# Patient Record
Sex: Male | Born: 1960
Health system: Southern US, Community
[De-identification: ages and names within clinical notes are randomized; demographics above are authoritative.]

## PROBLEM LIST (undated history)

## (undated) DIAGNOSIS — K219 Gastro-esophageal reflux disease without esophagitis: Secondary | ICD-10-CM

## (undated) DIAGNOSIS — N529 Male erectile dysfunction, unspecified: Secondary | ICD-10-CM

## (undated) DIAGNOSIS — U071 COVID-19: Secondary | ICD-10-CM

## (undated) DIAGNOSIS — G4733 Obstructive sleep apnea (adult) (pediatric): Secondary | ICD-10-CM

## (undated) DIAGNOSIS — F329 Major depressive disorder, single episode, unspecified: Secondary | ICD-10-CM

## (undated) DIAGNOSIS — F32A Depression, unspecified: Secondary | ICD-10-CM

## (undated) DIAGNOSIS — C801 Malignant (primary) neoplasm, unspecified: Secondary | ICD-10-CM

## (undated) DIAGNOSIS — T69029A Immersion foot, unspecified foot, initial encounter: Secondary | ICD-10-CM

## (undated) DIAGNOSIS — C61 Malignant neoplasm of prostate: Secondary | ICD-10-CM

## (undated) DIAGNOSIS — Z9989 Dependence on other enabling machines and devices: Secondary | ICD-10-CM

## (undated) DIAGNOSIS — Z87442 Personal history of urinary calculi: Secondary | ICD-10-CM

## (undated) DIAGNOSIS — E119 Type 2 diabetes mellitus without complications: Secondary | ICD-10-CM

## (undated) DIAGNOSIS — Z5189 Encounter for other specified aftercare: Secondary | ICD-10-CM

## (undated) DIAGNOSIS — T7840XA Allergy, unspecified, initial encounter: Secondary | ICD-10-CM

## (undated) DIAGNOSIS — I10 Essential (primary) hypertension: Secondary | ICD-10-CM

## (undated) DIAGNOSIS — R972 Elevated prostate specific antigen [PSA]: Secondary | ICD-10-CM

## (undated) DIAGNOSIS — E785 Hyperlipidemia, unspecified: Secondary | ICD-10-CM

## (undated) DIAGNOSIS — M1990 Unspecified osteoarthritis, unspecified site: Secondary | ICD-10-CM

## (undated) DIAGNOSIS — L709 Acne, unspecified: Secondary | ICD-10-CM

## (undated) DIAGNOSIS — E559 Vitamin D deficiency, unspecified: Secondary | ICD-10-CM

## (undated) DIAGNOSIS — G4730 Sleep apnea, unspecified: Secondary | ICD-10-CM

## (undated) DIAGNOSIS — N189 Chronic kidney disease, unspecified: Secondary | ICD-10-CM

## (undated) HISTORY — DX: Essential (primary) hypertension: I10

## (undated) HISTORY — PX: OTHER SURGICAL HISTORY: SHX169

## (undated) HISTORY — DX: Elevated prostate specific antigen (PSA): R97.20

## (undated) HISTORY — PX: COLONOSCOPY: SHX174

## (undated) HISTORY — DX: COVID-19: U07.1

## (undated) HISTORY — DX: Depression, unspecified: F32.A

## (undated) HISTORY — PX: HERNIA REPAIR: SHX51

## (undated) HISTORY — PX: INGUINAL HERNIA REPAIR: SHX194

## (undated) HISTORY — DX: Chronic kidney disease, unspecified: N18.9

## (undated) HISTORY — DX: Hyperlipidemia, unspecified: E78.5

## (undated) HISTORY — DX: Vitamin D deficiency, unspecified: E55.9

## (undated) HISTORY — DX: Sleep apnea, unspecified: G47.30

## (undated) HISTORY — PX: POLYPECTOMY: SHX149

## (undated) HISTORY — DX: Obstructive sleep apnea (adult) (pediatric): G47.33

## (undated) HISTORY — DX: Dependence on other enabling machines and devices: Z99.89

## (undated) HISTORY — DX: Allergy, unspecified, initial encounter: T78.40XA

## (undated) HISTORY — DX: Acne, unspecified: L70.9

## (undated) HISTORY — DX: Encounter for other specified aftercare: Z51.89

## (undated) HISTORY — DX: Major depressive disorder, single episode, unspecified: F32.9

## (undated) HISTORY — DX: Type 2 diabetes mellitus without complications: E11.9

## (undated) HISTORY — DX: Immersion foot, unspecified foot, initial encounter: T69.029A

## (undated) HISTORY — DX: Unspecified osteoarthritis, unspecified site: M19.90

---

## 1983-03-29 HISTORY — PX: OTHER SURGICAL HISTORY: SHX169

## 2004-11-30 ENCOUNTER — Ambulatory Visit: Payer: Self-pay | Admitting: Internal Medicine

## 2005-01-10 ENCOUNTER — Ambulatory Visit: Payer: Self-pay | Admitting: Internal Medicine

## 2005-03-11 ENCOUNTER — Ambulatory Visit: Payer: Self-pay | Admitting: Internal Medicine

## 2005-12-02 ENCOUNTER — Ambulatory Visit: Payer: Self-pay | Admitting: Internal Medicine

## 2005-12-09 ENCOUNTER — Ambulatory Visit: Payer: Self-pay | Admitting: Internal Medicine

## 2006-08-14 ENCOUNTER — Ambulatory Visit: Payer: Self-pay | Admitting: Internal Medicine

## 2006-08-14 LAB — CONVERTED CEMR LAB
CO2: 26 meq/L (ref 19–32)
Cholesterol: 213 mg/dL (ref 0–200)
Creatinine, Ser: 1.1 mg/dL (ref 0.4–1.5)
Direct LDL: 116.8 mg/dL
Glucose, Bld: 98 mg/dL (ref 70–99)
HDL: 36.7 mg/dL — ABNORMAL LOW (ref >39.0)
Potassium: 3.7 meq/L (ref 3.5–5.1)
Sodium: 143 meq/L (ref 135–145)
VLDL: 34 mg/dL (ref 0–40)

## 2007-04-02 ENCOUNTER — Telehealth: Payer: Self-pay | Admitting: Internal Medicine

## 2007-04-02 ENCOUNTER — Ambulatory Visit: Payer: Self-pay | Admitting: Internal Medicine

## 2007-04-04 LAB — CONVERTED CEMR LAB
ALT: 48 units/L (ref 0–53)
AST: 34 units/L (ref 0–37)
Alkaline Phosphatase: 77 units/L (ref 39–117)
BUN: 14 mg/dL (ref 6–23)
Basophils Relative: 0 % (ref 0.0–1.0)
Bilirubin, Direct: 0.2 mg/dL (ref 0.0–0.3)
CO2: 30 meq/L (ref 19–32)
Calcium: 10.1 mg/dL (ref 8.4–10.5)
Chloride: 103 meq/L (ref 96–112)
Eosinophils Relative: 0.8 % (ref 0.0–5.0)
GFR calc non Af Amer: 77 mL/min
Glucose, Bld: 85 mg/dL (ref 70–99)
Ketones, ur: NEGATIVE mg/dL
Monocytes Relative: 6.3 % (ref 3.0–11.0)
Nitrite: NEGATIVE
Platelets: 268 10*3/uL (ref 150–400)
RBC: 6.01 M/uL — ABNORMAL HIGH (ref 4.22–5.81)
Specific Gravity, Urine: >=1.03 (ref 1.000–1.03)
TSH: 3.33 microintl units/mL (ref 0.35–5.50)
Total CHOL/HDL Ratio: 7
Total Protein: 7.6 g/dL (ref 6.0–8.3)
Triglycerides: 196 mg/dL — ABNORMAL HIGH (ref 0–149)
Urine Glucose: NEGATIVE mg/dL
VLDL: 39 mg/dL (ref 0–40)
Vit D, 1,25-Dihydroxy: 17 — ABNORMAL LOW (ref 30–89)
WBC: 8.4 10*3/uL (ref 4.5–10.5)
pH: 5.5 (ref 5.0–8.0)

## 2007-04-05 ENCOUNTER — Encounter: Payer: Self-pay | Admitting: Internal Medicine

## 2007-04-05 DIAGNOSIS — I10 Essential (primary) hypertension: Secondary | ICD-10-CM

## 2007-04-06 ENCOUNTER — Ambulatory Visit: Payer: Self-pay | Admitting: Internal Medicine

## 2007-04-06 ENCOUNTER — Ambulatory Visit: Payer: Self-pay | Admitting: Gastroenterology

## 2007-04-06 DIAGNOSIS — F329 Major depressive disorder, single episode, unspecified: Secondary | ICD-10-CM

## 2007-04-06 DIAGNOSIS — E559 Vitamin D deficiency, unspecified: Secondary | ICD-10-CM

## 2007-04-06 DIAGNOSIS — E785 Hyperlipidemia, unspecified: Secondary | ICD-10-CM | POA: Insufficient documentation

## 2007-04-06 DIAGNOSIS — K4020 Bilateral inguinal hernia, without obstruction or gangrene, not specified as recurrent: Secondary | ICD-10-CM | POA: Insufficient documentation

## 2007-05-21 ENCOUNTER — Encounter: Payer: Self-pay | Admitting: Internal Medicine

## 2007-05-21 ENCOUNTER — Encounter: Payer: Self-pay | Admitting: Gastroenterology

## 2007-05-21 ENCOUNTER — Ambulatory Visit: Payer: Self-pay | Admitting: Gastroenterology

## 2007-07-02 ENCOUNTER — Ambulatory Visit: Payer: Self-pay | Admitting: Internal Medicine

## 2007-07-02 LAB — CONVERTED CEMR LAB
BUN: 16 mg/dL (ref 6–23)
Calcium: 9.2 mg/dL (ref 8.4–10.5)
GFR calc Af Amer: 85 mL/min
GFR calc non Af Amer: 70 mL/min
Glucose, Bld: 111 mg/dL — ABNORMAL HIGH (ref 70–99)
HDL: 40 mg/dL (ref >39.0)
Potassium: 3.6 meq/L (ref 3.5–5.1)
Triglycerides: 204 mg/dL (ref 0–149)
VLDL: 41 mg/dL — ABNORMAL HIGH (ref 0–40)

## 2007-07-09 ENCOUNTER — Ambulatory Visit: Payer: Self-pay | Admitting: Internal Medicine

## 2008-04-04 ENCOUNTER — Ambulatory Visit: Payer: Self-pay | Admitting: Internal Medicine

## 2008-04-04 LAB — CONVERTED CEMR LAB
ALT: 40 units/L (ref 0–53)
AST: 26 units/L (ref 0–37)
Albumin: 3.9 g/dL (ref 3.5–5.2)
Alkaline Phosphatase: 77 units/L (ref 39–117)
BUN: 16 mg/dL (ref 6–23)
Basophils Relative: 1.2 % (ref 0.0–3.0)
CO2: 31 meq/L (ref 19–32)
Chloride: 107 meq/L (ref 96–112)
Cholesterol: 159 mg/dL (ref 0–200)
Eosinophils Absolute: 0.1 10*3/uL (ref 0.0–0.7)
Eosinophils Relative: 1.6 % (ref 0.0–5.0)
GFR calc non Af Amer: 77 mL/min
Hemoglobin, Urine: NEGATIVE
LDL Cholesterol: 99 mg/dL (ref 0–99)
Leukocytes, UA: NEGATIVE
Lymphocytes Relative: 38.1 % (ref 12.0–46.0)
MCV: 74.5 fL — ABNORMAL LOW (ref 78.0–100.0)
Neutrophils Relative %: 53.2 % (ref 43.0–77.0)
Nitrite: NEGATIVE
Platelets: 224 10*3/uL (ref 150–400)
Potassium: 4 meq/L (ref 3.5–5.1)
RBC: 5.7 M/uL (ref 4.22–5.81)
Specific Gravity, Urine: 1.015 (ref 1.000–1.03)
Total Bilirubin: 0.7 mg/dL (ref 0.3–1.2)
Total CHOL/HDL Ratio: 3.9
Urine Glucose: NEGATIVE mg/dL
Urobilinogen, UA: 0.2 (ref 0.0–1.0)
VLDL: 19 mg/dL (ref 0–40)
WBC: 5 10*3/uL (ref 4.5–10.5)

## 2008-04-11 ENCOUNTER — Ambulatory Visit: Payer: Self-pay | Admitting: Internal Medicine

## 2008-04-21 ENCOUNTER — Telehealth: Payer: Self-pay | Admitting: Internal Medicine

## 2009-01-15 ENCOUNTER — Telehealth: Payer: Self-pay | Admitting: Internal Medicine

## 2009-04-17 ENCOUNTER — Ambulatory Visit: Payer: Self-pay | Admitting: Internal Medicine

## 2009-04-17 LAB — CONVERTED CEMR LAB
ALT: 39 units/L (ref 0–53)
AST: 29 units/L (ref 0–37)
Alkaline Phosphatase: 89 units/L (ref 39–117)
BUN: 13 mg/dL (ref 6–23)
Basophils Relative: 0.6 % (ref 0.0–3.0)
Bilirubin Urine: NEGATIVE
Bilirubin, Direct: 0 mg/dL (ref 0.0–0.3)
Chloride: 103 meq/L (ref 96–112)
Creatinine, Ser: 1.1 mg/dL (ref 0.4–1.5)
Eosinophils Relative: 1.1 % (ref 0.0–5.0)
Glucose, Bld: 91 mg/dL (ref 70–99)
LDL Cholesterol: 82 mg/dL (ref 0–99)
Lymphocytes Relative: 33.3 % (ref 12.0–46.0)
Monocytes Relative: 7.6 % (ref 3.0–12.0)
Neutrophils Relative %: 57.4 % (ref 43.0–77.0)
Nitrite: NEGATIVE
Potassium: 3.9 meq/L (ref 3.5–5.1)
RBC: 5.89 M/uL — ABNORMAL HIGH (ref 4.22–5.81)
Total Protein, Urine: NEGATIVE mg/dL
Total Protein: 8 g/dL (ref 6.0–8.3)
Urine Glucose: NEGATIVE mg/dL
VLDL: 35.2 mg/dL (ref 0.0–40.0)
WBC: 6.6 10*3/uL (ref 4.5–10.5)
pH: 7.5 (ref 5.0–8.0)

## 2009-04-22 ENCOUNTER — Ambulatory Visit: Payer: Self-pay | Admitting: Internal Medicine

## 2009-09-29 ENCOUNTER — Telehealth: Payer: Self-pay | Admitting: Internal Medicine

## 2009-09-29 DIAGNOSIS — R5382 Chronic fatigue, unspecified: Secondary | ICD-10-CM | POA: Insufficient documentation

## 2009-10-04 ENCOUNTER — Encounter: Payer: Self-pay | Admitting: Internal Medicine

## 2009-10-12 ENCOUNTER — Telehealth: Payer: Self-pay | Admitting: Internal Medicine

## 2009-10-22 ENCOUNTER — Telehealth: Payer: Self-pay | Admitting: Internal Medicine

## 2009-10-30 ENCOUNTER — Telehealth: Payer: Self-pay | Admitting: Internal Medicine

## 2009-10-30 DIAGNOSIS — G4733 Obstructive sleep apnea (adult) (pediatric): Secondary | ICD-10-CM | POA: Insufficient documentation

## 2010-04-20 ENCOUNTER — Telehealth: Payer: Self-pay | Admitting: Internal Medicine

## 2010-04-27 ENCOUNTER — Ambulatory Visit: Admit: 2010-04-27 | Payer: Self-pay | Admitting: Internal Medicine
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
## 2010-04-27 NOTE — Progress Notes (Signed)
Summary: RX   Phone Note Call from Patient Call back at [phone] - Erie Noe   Caller: Wife ext [phone] Summary of Call: Patient needs rx for CPAP. OK?  Initial call taken by: Lamar Sprinkles, CMA,  October 30, 2009 11:51 AM  Follow-up for Phone Call        ok thx Follow-up by: Tresa Garter MD,  October 30, 2009 12:56 PM  Additional Follow-up for Phone Call Additional follow up Details #1::        Order pending signature Additional Follow-up by: Lamar Sprinkles, CMA,  October 30, 2009 3:31 PM  New Problems: OBSTRUCTIVE SLEEP APNEA (ICD-327.23)   Additional Follow-up for Phone Call Additional follow up Details #2::    Pt's wife informed, rx up front Follow-up by: Lamar Sprinkles, CMA,  October 30, 2009 6:09 PM  New Problems: OBSTRUCTIVE SLEEP APNEA (ICD-327.23)

## 2010-04-27 NOTE — Progress Notes (Signed)
Summary: BP MED  Phone Note Call from Patient   Summary of Call: Pt was recently changed to higher dose of triam/hctz due to pharm not having avail dose. Pt found local pharmacy that has in stock he would like lower dose sent in.  Initial call taken by: Lamar Sprinkles, CMA,  October 22, 2009 9:21 AM  Follow-up for Phone Call        ok  Follow-up by: Tresa Garter MD,  October 22, 2009 12:10 PM  Additional Follow-up for Phone Call Additional follow up Details #1::        informed pt rx sent in Additional Follow-up by: Brenton Grills MA,  October 22, 2009 1:36 PM    New/Updated Medications: TRIAMTERENE-HCTZ 37.5-25 MG TABS (TRIAMTERENE-HCTZ) 1 once daily Prescriptions: TRIAMTERENE-HCTZ 37.5-25 MG TABS (TRIAMTERENE-HCTZ) 1 once daily  #90 x 1   Entered by:   Lamar Sprinkles, CMA   Authorized by:   Tresa Garter MD   Signed by:   Lamar Sprinkles, CMA on 10/22/2009   Method used:   Electronically to        [facility]* (retail)       [address]       [place], [place]  [id number]       Ph: [phone]       Fax: [phone]   RxID:   [id number]

## 2010-04-27 NOTE — Progress Notes (Signed)
Summary: Med question-Traim/HCTZ  Phone Note Refill Request Message from:  Fax from Pharmacy  pharm states they can't get Triam/HCTZ 37.5/25.  They do have 75/50mg .  Can we change?    Method Requested: Electronic Initial call taken by: Lanier Prude, [facility]),  October 12, 2009 4:15 PM  Follow-up for Phone Call        ok to change  thx Follow-up by: Tresa Garter MD,  October 12, 2009 6:06 PM    New/Updated Medications: TRIAMTERENE-HCTZ 75-50 MG TABS (TRIAMTERENE-HCTZ) 1/2 tab once daily Prescriptions: TRIAMTERENE-HCTZ 75-50 MG TABS (TRIAMTERENE-HCTZ) 1/2 tab once daily  #45 x 1   Entered by:   Lamar Sprinkles, CMA   Authorized by:   Tresa Garter MD   Signed by:   Lamar Sprinkles, CMA on 10/12/2009   Method used:   Electronically to        [facility]. [facility] * (retail)       [address]       [place], [place]  [address]       Ph: [phone]       Fax: [phone]   RxID:   [id number]
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
## 2010-04-29 NOTE — Progress Notes (Signed)
  Phone Note Call from Patient Call back at Home Phone [phone]   Caller: [phone] ext [phone] [phone] Call For: Tresa Garter MD Summary of Call: Pt had to resched cpx, pt needs 2 mos refills of meds, cpx in feb. Please advise Initial call taken by: Verdell Face,  April 20, 2010 3:58 PM    Prescriptions: LOVASTATIN 40 MG TABS (LOVASTATIN) Take 1 tab by mouth daily  #90 x 3   Entered by:   Lanier Prude, CMA(AAMA)   Authorized by:   Tresa Garter MD   Signed by:   Lanier Prude, [facility]) on 04/21/2010   Method used:   Electronically to        [facility]* (retail)       [address]       [address] Shipping/mailing       [place], [place]  [address]       Ph: [phone]       Fax: [phone]   RxID:   [id number] TRIAMTERENE-HCTZ 37.5-25 MG TABS (TRIAMTERENE-HCTZ) 1 once daily  #90 x 3   Entered by:   Lanier Prude, CMA(AAMA)   Authorized by:   Tresa Garter MD   Signed by:   Lanier Prude, [facility]) on 04/21/2010   Method used:   Electronically to        [facility]* (retail)       [address]       [address]       [place], [place]  [address]       Ph: [phone]       Fax: [phone]   RxID:   [id number] LISINOPRIL 40 MG TABS (LISINOPRIL) 1 by mouth qd  #90 x 3   Entered by:   Lanier Prude, CMA(AAMA)   Authorized by:   Tresa Garter MD   Signed by:   Lanier Prude, [facility]) on 04/21/2010   Method used:   Electronically to        [facility]* (retail)       [address]       [address] Shipping[address]mailing       [place], [place]  [address]       Ph: [phone]       Fax: [phone]   RxID:   [id number]

## 2010-05-04 ENCOUNTER — Encounter: Payer: Self-pay | Admitting: Internal Medicine

## 2010-05-19 ENCOUNTER — Other Ambulatory Visit: Payer: Commercial Managed Care - PPO

## 2010-05-19 ENCOUNTER — Encounter (INDEPENDENT_AMBULATORY_CARE_PROVIDER_SITE_OTHER): Payer: Self-pay | Admitting: *Deleted

## 2010-05-19 ENCOUNTER — Other Ambulatory Visit: Payer: Self-pay | Admitting: Internal Medicine

## 2010-05-19 DIAGNOSIS — Z0389 Encounter for observation for other suspected diseases and conditions ruled out: Secondary | ICD-10-CM

## 2010-05-19 DIAGNOSIS — Z0000 Encounter for general adult medical examination without abnormal findings: Secondary | ICD-10-CM

## 2010-05-19 LAB — CBC WITH DIFFERENTIAL/PLATELET
Basophils Absolute: 0 10*3/uL (ref 0.0–0.1)
Eosinophils Absolute: 0.1 10*3/uL (ref 0.0–0.7)
HCT: 43.7 % (ref 39.0–52.0)
Lymphs Abs: 2.6 10*3/uL (ref 0.7–4.0)
MCV: 76.1 fl — ABNORMAL LOW (ref 78.0–100.0)
Monocytes Absolute: 0.5 10*3/uL (ref 0.1–1.0)
Platelets: 276 10*3/uL (ref 150.0–400.0)
RDW: 15.3 % — ABNORMAL HIGH (ref 11.5–14.6)

## 2010-05-19 LAB — BASIC METABOLIC PANEL
BUN: 11 mg/dL (ref 6–23)
Chloride: 107 mEq/L (ref 96–112)
Glucose, Bld: 76 mg/dL (ref 70–99)
Potassium: 4.5 mEq/L (ref 3.5–5.1)

## 2010-05-19 LAB — URINALYSIS
Bilirubin Urine: NEGATIVE
Total Protein, Urine: NEGATIVE
Urine Glucose: NEGATIVE

## 2010-05-19 LAB — LIPID PANEL
Cholesterol: 149 mg/dL (ref 0–200)
HDL: 40.8 mg/dL (ref >39.00)
VLDL: 18.2 mg/dL (ref 0.0–40.0)

## 2010-05-19 LAB — TSH: TSH: 3.58 u[IU]/mL (ref 0.35–5.50)

## 2010-05-19 LAB — HEPATIC FUNCTION PANEL: Total Bilirubin: 0.9 mg/dL (ref 0.3–1.2)

## 2010-05-26 ENCOUNTER — Encounter (INDEPENDENT_AMBULATORY_CARE_PROVIDER_SITE_OTHER): Payer: Commercial Managed Care - PPO | Admitting: Internal Medicine

## 2010-05-26 ENCOUNTER — Encounter: Payer: Self-pay | Admitting: Internal Medicine

## 2010-05-26 DIAGNOSIS — L708 Other acne: Secondary | ICD-10-CM | POA: Insufficient documentation

## 2010-05-26 DIAGNOSIS — Z0000 Encounter for general adult medical examination without abnormal findings: Secondary | ICD-10-CM

## 2010-05-26 DIAGNOSIS — Z23 Encounter for immunization: Secondary | ICD-10-CM
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
## 2010-08-09 ENCOUNTER — Encounter: Payer: Self-pay | Admitting: Internal Medicine
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
## 2010-08-11 ENCOUNTER — Ambulatory Visit (INDEPENDENT_AMBULATORY_CARE_PROVIDER_SITE_OTHER): Payer: 59 | Admitting: Internal Medicine

## 2010-08-11 ENCOUNTER — Encounter: Payer: Self-pay | Admitting: Internal Medicine

## 2010-08-11 DIAGNOSIS — R5381 Other malaise: Secondary | ICD-10-CM

## 2010-08-11 DIAGNOSIS — L708 Other acne: Secondary | ICD-10-CM

## 2010-08-11 DIAGNOSIS — F329 Major depressive disorder, single episode, unspecified: Secondary | ICD-10-CM

## 2010-08-11 DIAGNOSIS — E559 Vitamin D deficiency, unspecified: Secondary | ICD-10-CM

## 2010-08-11 DIAGNOSIS — I10 Essential (primary) hypertension: Secondary | ICD-10-CM

## 2010-08-11 MED ORDER — VITAMIN D 1000 UNITS PO TABS: 1000.0000 [IU] | ORAL_TABLET | Freq: Every day | ORAL | Status: DC

## 2010-08-11 NOTE — Assessment & Plan Note (Signed)
On Rx 

## 2010-08-11 NOTE — Progress Notes (Signed)
  Subjective:    Patient ID: Nicholas Carr, male    DOB: 06/15/1960, 50 y.o.   MRN: [id number]  HPI  The patient presents for a follow-up of  chronic hypertension, chronic dyslipidemia, obesity, and face rash controlled with medicines    Review of Systems  Constitutional: Negative for chills.  HENT: Negative for nosebleeds, facial swelling and neck pain.   Respiratory: Negative for stridor.   Cardiovascular: Negative for chest pain.  Gastrointestinal: Negative for vomiting.  Genitourinary: Negative for penile pain.  Musculoskeletal: Negative for back pain.  Skin: Positive for rash (acne/scars).  Neurological: Negative for seizures.  Psychiatric/Behavioral: Negative for suicidal ideas, confusion and dysphoric mood.   Wt Readings from Last 3 Encounters:  08/11/10 296 lb (134.265 kg)  05/26/10 307 lb (139.254 kg)  04/22/09 320 lb (145.151 kg)       Objective:   Physical Exam  Constitutional: He is oriented to person, place, and time. He appears well-developed.  HENT:  Mouth/Throat: Oropharynx is clear and moist.  Eyes: Conjunctivae are normal. Pupils are equal, round, and reactive to light.  Neck: Normal range of motion. No JVD present. No thyromegaly present.  Cardiovascular: Normal rate, regular rhythm, normal heart sounds and intact distal pulses.  Exam reveals no gallop and no friction rub.   No murmur heard. Pulmonary/Chest: Effort normal and breath sounds normal. No respiratory distress. He has no wheezes. He has no rales. He exhibits no tenderness.  Abdominal: Soft. Bowel sounds are normal. He exhibits no distension and no mass. There is no tenderness. There is no rebound and no guarding.  Musculoskeletal: Normal range of motion. He exhibits no edema and no tenderness.  Lymphadenopathy:    He has no cervical adenopathy.  Neurological: He is alert and oriented to person, place, and time. He has normal reflexes. No cranial nerve deficit. He exhibits normal muscle tone.  Coordination normal.  Skin: Skin is warm and dry. Rash (better on face) noted.  Psychiatric: He has a normal mood and affect. His behavior is normal. Judgment and thought content normal.        .  Assessment & Plan:  VITAMIN D DEFICIENCY On Rx  HYPERTENSION On Rx  DEPRESSION On Rx  FATIGUE Better  ACNE, CYSTIC Better. OK to use bleaching.

## 2010-08-11 NOTE — Assessment & Plan Note (Signed)
Better. OK to use bleaching.

## 2010-08-11 NOTE — Assessment & Plan Note (Signed)
Better  

## 2010-08-13 NOTE — Assessment & Plan Note (Signed)
[facility]                             PRIMARY CARE OFFICE NOTE   PAYDEN, DOCTER                         MRN:          [id number]  DATE:12/09/2005                            DOB:          April 13, 1960    The patient is a 50 year old male who presents for a wellness examination.   PAST MEDICAL HISTORY:  As per November 30, 2004 note.  He has been feeling  better, losing weight.  More successful with healthy life-style.   FAMILY HISTORY:  As per November 30, 2004 note.   SOCIAL HISTORY:  As per November 30, 2004 note.   ALLERGIES:  None.   CURRENT MEDICATIONS:  Reviewed.  Is off of oxygen.  Is out of Ativan.   REVIEW OF SYSTEMS:  Emotional, doing much better.  No chest pain or  shortness of breath.  Denies being depressed.  The rest is negative.   PHYSICAL:  Blood pressure 165/104, pulse 83, temperature 97.6, weight 305  pounds (was 325).  Looks well.  No acute distress.  HEENT:  Moist mucosa.  NECK:  Supple.  No thyromegaly or bruit.  LUNGS:  Clear.  No wheeze or rub.  CARDIAC:  S1, S2.  No murmur.  No gallop.  ABDOMEN:  Soft and nontender.  No organomegaly.  LOWER EXTREMITIES:  Without edema.  PSYCH:  He is alert and talkative.  Denies being depressed.  RECTAL:  Normal prostate.  Stool guaiac negative.  No masses.  SKIN:  Lesion on the buttocks with irritation.   LABS:  December 02, 2005 CBC normal.  Cholesterol 211, LDL 131.  TSH normal.  Urinalysis normal.  EKG with normal sinus rhythm.   ASSESSMENT AND PLAN:  1. Normal wellness examination.  Age/health related issues discussed.      Healthy life-style was discussed.  He denies being under a lot of      stress now, feeling much better.  2. Hypertension with elevated blood pressure.  Increase Maxzide to 37.5/25      full tablet daily.  3. Rash/intertrigo.  Ketoconazole 2% to use twice daily for a month.      Nylon underwear.                                   Georgina Quint.  Plotnikov, MD   AVP/MedQ  DD:  12/13/2005  DT:  12/13/2005  Job #:  [id number]

## 2010-10-07 ENCOUNTER — Encounter: Payer: Self-pay | Admitting: Internal Medicine

## 2010-10-07 ENCOUNTER — Ambulatory Visit: Payer: 59 | Admitting: Family Medicine

## 2010-10-12 ENCOUNTER — Ambulatory Visit (INDEPENDENT_AMBULATORY_CARE_PROVIDER_SITE_OTHER): Payer: 59 | Admitting: Internal Medicine

## 2010-10-12 ENCOUNTER — Other Ambulatory Visit (INDEPENDENT_AMBULATORY_CARE_PROVIDER_SITE_OTHER): Payer: 59

## 2010-10-12 ENCOUNTER — Encounter: Payer: Self-pay | Admitting: Internal Medicine

## 2010-10-12 VITALS — BP 135/85 | HR 80 | Temp 98.3°F | Resp 16 | Ht 74.5 in | Wt 304.0 lb

## 2010-10-12 DIAGNOSIS — I10 Essential (primary) hypertension: Secondary | ICD-10-CM

## 2010-10-12 DIAGNOSIS — F329 Major depressive disorder, single episode, unspecified: Secondary | ICD-10-CM

## 2010-10-12 DIAGNOSIS — L708 Other acne: Secondary | ICD-10-CM

## 2010-10-12 DIAGNOSIS — G4733 Obstructive sleep apnea (adult) (pediatric): Secondary | ICD-10-CM

## 2010-10-12 DIAGNOSIS — Z0000 Encounter for general adult medical examination without abnormal findings: Secondary | ICD-10-CM

## 2010-10-12 DIAGNOSIS — E785 Hyperlipidemia, unspecified: Secondary | ICD-10-CM

## 2010-10-12 DIAGNOSIS — F3289 Other specified depressive episodes: Secondary | ICD-10-CM

## 2010-10-12 LAB — CBC WITH DIFFERENTIAL/PLATELET
Basophils Absolute: 0.1 10*3/uL (ref 0.0–0.1)
Eosinophils Relative: 0.4 % (ref 0.0–5.0)
HCT: 41.8 % (ref 39.0–52.0)
Hemoglobin: 13.6 g/dL (ref 13.0–17.0)
Lymphocytes Relative: 28.9 % (ref 12.0–46.0)
Lymphs Abs: 1.8 10*3/uL (ref 0.7–4.0)
Monocytes Relative: 5.9 % (ref 3.0–12.0)
Neutro Abs: 3.9 10*3/uL (ref 1.4–7.7)
RBC: 5.53 Mil/uL (ref 4.22–5.81)
RDW: 15.4 % — ABNORMAL HIGH (ref 11.5–14.6)
WBC: 6.2 10*3/uL (ref 4.5–10.5)

## 2010-10-12 LAB — TSH: TSH: 3.13 u[IU]/mL (ref 0.35–5.50)

## 2010-10-12 LAB — URINALYSIS
Hgb urine dipstick: NEGATIVE
Nitrite: NEGATIVE
Specific Gravity, Urine: <=1.005 (ref 1.000–1.030)
Total Protein, Urine: NEGATIVE
Urine Glucose: NEGATIVE
pH: 7 (ref 5.0–8.0)

## 2010-10-12 LAB — COMPREHENSIVE METABOLIC PANEL
ALT: 35 U/L (ref 0–53)
CO2: 29 mEq/L (ref 19–32)
Calcium: 9.3 mg/dL (ref 8.4–10.5)
Chloride: 105 mEq/L (ref 96–112)
Creatinine, Ser: 1.1 mg/dL (ref 0.4–1.5)
GFR: 94.91 mL/min (ref >60.00)
Glucose, Bld: 104 mg/dL — ABNORMAL HIGH (ref 70–99)
Total Bilirubin: 0.6 mg/dL (ref 0.3–1.2)
Total Protein: 7.6 g/dL (ref 6.0–8.3)

## 2010-10-12 LAB — LIPID PANEL
Cholesterol: 136 mg/dL (ref 0–200)
HDL: 46.3 mg/dL (ref >39.00)

## 2010-10-12 MED ORDER — TRETINOIN 0.05 % EX CREA: TOPICAL_CREAM | Freq: Every day | CUTANEOUS | Status: DC

## 2010-10-12 NOTE — Assessment & Plan Note (Addendum)
Cont exercising Loose wt Labs DOT filled out Opth q 12 mo - he is doing it

## 2010-10-12 NOTE — Assessment & Plan Note (Signed)
Cont CPAP

## 2010-10-12 NOTE — Progress Notes (Signed)
Subjective:    Patient ID: Nicholas Carr, male    DOB: 02/06/1961, 50 y.o.   MRN: [id number]  HPI The patient is here for a wellness exam. The patient has been doing well overall without major physical or psychological issues going on lately. The patient needs to address  chronic hypertension that has been well controlled with medicines F/u  anxiety  Review of Systems  Constitutional: Negative for appetite change, fatigue and unexpected weight change.  HENT: Negative for hearing loss, nosebleeds, congestion, sore throat, sneezing, trouble swallowing and neck pain.   Eyes: Negative for itching and visual disturbance.  Respiratory: Negative for cough and chest tightness.   Cardiovascular: Negative for chest pain, palpitations and leg swelling.  Gastrointestinal: Negative for nausea, diarrhea, blood in stool and abdominal distention.  Genitourinary: Negative for frequency and hematuria.  Musculoskeletal: Negative for back pain, joint swelling and gait problem.  Skin: Positive for rash.  Neurological: Negative for dizziness, tremors, seizures, facial asymmetry, speech difficulty, weakness and numbness.  Psychiatric/Behavioral: Negative for sleep disturbance, dysphoric mood and agitation. The patient is not nervous/anxious.     BP Readings from Last 3 Encounters:  10/12/10 172/92  08/11/10 120/80  05/26/10 138/80   Wt Readings from Last 3 Encounters:  10/12/10 304 lb (137.893 kg)  08/11/10 296 lb (134.265 kg)  05/26/10 307 lb (139.254 kg)        Objective:   Physical Exam  Constitutional: He is oriented to person, place, and time. He appears well-developed. No distress.       Obese  HENT:  Head: Normocephalic and atraumatic.  Right Ear: External ear normal.  Left Ear: External ear normal.  Nose: Nose normal.  Mouth/Throat: Oropharynx is clear and moist. No oropharyngeal exudate.  Eyes: Conjunctivae and EOM are normal. Pupils are equal, round, and reactive to light. Right eye  exhibits no discharge. Left eye exhibits no discharge. No scleral icterus.  Neck: Normal range of motion. Neck supple. No JVD present. No tracheal deviation present. No thyromegaly present.  Cardiovascular: Normal rate, regular rhythm, normal heart sounds and intact distal pulses.  Exam reveals no gallop and no friction rub.   No murmur heard. Pulmonary/Chest: Effort normal and breath sounds normal. No stridor. No respiratory distress. He has no wheezes. He has no rales. He exhibits no tenderness.  Abdominal: Soft. Bowel sounds are normal. He exhibits no distension and no mass. There is no tenderness. There is no rebound and no guarding.  Musculoskeletal: Normal range of motion. He exhibits no edema and no tenderness.  Lymphadenopathy:    He has no cervical adenopathy.  Neurological: He is alert and oriented to person, place, and time. He has normal reflexes. No cranial nerve deficit. He exhibits normal muscle tone. Coordination normal.  Skin: Skin is warm and dry. Rash (ingr hair on neck) noted. He is not diaphoretic. No erythema. No pallor.  Psychiatric: He has a normal mood and affect. His behavior is normal. Judgment and thought content normal.        Lab Results  Component Value Date   WBC 6.2 10/12/2010   HGB 13.6 10/12/2010   HCT 41.8 10/12/2010   PLT 250.0 10/12/2010   CHOL 136 10/12/2010   TRIG 60.0 10/12/2010   HDL 46.30 10/12/2010   LDLDIRECT 85.6 07/02/2007   ALT 35 10/12/2010   AST 26 10/12/2010   NA 138 10/12/2010   K 4.1 10/12/2010   CL 105 10/12/2010   CREATININE 1.1 10/12/2010   BUN 14 10/12/2010  CO2 29 10/12/2010   TSH 3.13 10/12/2010   PSA 0.59 05/19/2010     Assessment & Plan:    DOT form was filled out

## 2010-10-12 NOTE — Assessment & Plan Note (Addendum)
Rechecked 135/85

## 2010-10-12 NOTE — Assessment & Plan Note (Signed)
On rx 

## 2010-10-12 NOTE — Assessment & Plan Note (Signed)
Will try retin A

## 2010-11-18 ENCOUNTER — Other Ambulatory Visit: Payer: Self-pay | Admitting: Internal Medicine

## 2010-11-18 ENCOUNTER — Telehealth: Payer: Self-pay | Admitting: *Deleted

## 2010-11-18 NOTE — Telephone Encounter (Signed)
  Requested Medications     LORazepam (ATIVAN) 1 MG tablet [Pharmacy Med Name: LORAZEPAM 1 MG TABLET TAB 1 MG]    TAKE 1-2 TABLETS BY MOUTH TWICE DAILY AS NEEDED    Disp: 60 tablet R: 2 Start: 11/18/2010 Class: Normal    Originally ordered on: 08/09/2010 Last refill: 10/04/2010 Order History

## 2010-11-19 MED ORDER — LORAZEPAM 1 MG PO TABS: 1.0000 mg | ORAL_TABLET | Freq: Two times a day (BID) | ORAL | Status: DC | PRN

## 2010-11-19 NOTE — Telephone Encounter (Signed)
Called in.

## 2010-11-19 NOTE — Telephone Encounter (Signed)
OK to fill this prescription with additional refills x5 Thank you!  

## 2010-12-16 ENCOUNTER — Ambulatory Visit: Payer: 59 | Admitting: Internal Medicine

## 2010-12-16 DIAGNOSIS — Z0289 Encounter for other administrative examinations: Secondary | ICD-10-CM

## 2011-04-18 ENCOUNTER — Other Ambulatory Visit: Payer: Self-pay | Admitting: Internal Medicine

## 2011-04-18 ENCOUNTER — Telehealth: Payer: Self-pay | Admitting: *Deleted

## 2011-04-18 ENCOUNTER — Other Ambulatory Visit: Payer: Self-pay | Admitting: *Deleted

## 2011-04-18 MED ORDER — LOVASTATIN 40 MG PO TABS: 40.0000 mg | ORAL_TABLET | Freq: Every day | ORAL | Status: DC

## 2011-04-18 MED ORDER — VERAPAMIL HCL ER 360 MG PO CP24: 360.0000 mg | ORAL_CAPSULE | Freq: Every day | ORAL | Status: DC

## 2011-04-18 MED ORDER — LISINOPRIL 40 MG PO TABS: 40.0000 mg | ORAL_TABLET | Freq: Every day | ORAL | Status: DC

## 2011-04-18 NOTE — Telephone Encounter (Signed)
Ok then.Marland KitchenMarland KitchenMarland KitchenMarland Kitchenpoor record here - saw d/c order and no restart order and was not on med rec and blood pressures were normal. Didn't have any way to know he was still taking maxzide.  OK for prn refills

## 2011-04-18 NOTE — Telephone Encounter (Signed)
Reviewed chart - stopped maxzide 05/26/10 due to exacerbation of acne. Reviewed BPs May and July '12 good control off maxzide  Plan - defer refill.           Dr. Roena Malady to review

## 2011-04-18 NOTE — Telephone Encounter (Signed)
Rf req for Triam/ HCTZ 37.5/25mg  1 po qd. # 90. Med is not active on list. Ok to Rf?

## 2011-04-18 NOTE — Telephone Encounter (Signed)
OV w/any MD - bring BP home records Thx

## 2011-04-18 NOTE — Telephone Encounter (Signed)
Please see last phone note, Spoke w/wife and pt - pt has been on maxide daily. Dr Macario Golds advised pt to resume med b/c BP was elevated after stopping med. Pt is very concerned that BP will become severely elevated w/o med. He took last pill today. Please advise. Or OK for temp supply while waiting on Dr Macario Golds to return to the office?

## 2011-04-19 MED ORDER — TRIAMTERENE-HCTZ 37.5-25 MG PO CAPS: 1.0000 | ORAL_CAPSULE | ORAL | Status: DC

## 2011-04-19 NOTE — Telephone Encounter (Signed)
Wife informed

## 2011-05-30 ENCOUNTER — Other Ambulatory Visit: Payer: Self-pay | Admitting: Internal Medicine

## 2011-09-26 ENCOUNTER — Telehealth: Payer: Self-pay | Admitting: *Deleted

## 2011-09-26 DIAGNOSIS — Z125 Encounter for screening for malignant neoplasm of prostate: Secondary | ICD-10-CM

## 2011-09-26 DIAGNOSIS — Z0000 Encounter for general adult medical examination without abnormal findings: Secondary | ICD-10-CM

## 2011-09-26 NOTE — Telephone Encounter (Signed)
CPE labs entered.  

## 2011-09-26 NOTE — Telephone Encounter (Signed)
Message copied by Merrilyn Puma on Mon Sep 26, 2011 11:48 AM ------      Message from: Etheleen Sia      Created: Fri Sep 23, 2011  3:27 PM      Regarding: LABS       PHYSICAL LABS FOR AUG 66.  THEY WILL CALL THE PHARMACY FOR SOME REFILLS NEEDED PRIOR TO THE PHYSICAL.

## 2011-09-27 ENCOUNTER — Other Ambulatory Visit: Payer: Self-pay | Admitting: Internal Medicine

## 2011-09-28 NOTE — Telephone Encounter (Signed)
Ok to RF? 

## 2011-11-18 ENCOUNTER — Encounter: Payer: 59 | Admitting: Internal Medicine

## 2011-11-18 ENCOUNTER — Other Ambulatory Visit (INDEPENDENT_AMBULATORY_CARE_PROVIDER_SITE_OTHER): Payer: 59

## 2011-11-18 DIAGNOSIS — Z125 Encounter for screening for malignant neoplasm of prostate: Secondary | ICD-10-CM

## 2011-11-18 DIAGNOSIS — Z0000 Encounter for general adult medical examination without abnormal findings: Secondary | ICD-10-CM

## 2011-11-18 LAB — URINALYSIS, ROUTINE W REFLEX MICROSCOPIC
Nitrite: NEGATIVE
Total Protein, Urine: NEGATIVE
pH: 6.5 (ref 5.0–8.0)

## 2011-11-18 LAB — LIPID PANEL
Cholesterol: 154 mg/dL (ref 0–200)
LDL Cholesterol: 91 mg/dL (ref 0–99)
Total CHOL/HDL Ratio: 4

## 2011-11-18 LAB — BASIC METABOLIC PANEL
BUN: 16 mg/dL (ref 6–23)
Chloride: 103 mEq/L (ref 96–112)
Potassium: 4.3 mEq/L (ref 3.5–5.1)
Sodium: 139 mEq/L (ref 135–145)

## 2011-11-18 LAB — CBC WITH DIFFERENTIAL/PLATELET
Eosinophils Relative: 1.1 % (ref 0.0–5.0)
HCT: 46.3 % (ref 39.0–52.0)
Lymphs Abs: 2.1 10*3/uL (ref 0.7–4.0)
MCV: 76.5 fl — ABNORMAL LOW (ref 78.0–100.0)
Monocytes Absolute: 0.5 10*3/uL (ref 0.1–1.0)
Platelets: 260 10*3/uL (ref 150.0–400.0)
RDW: 14.8 % — ABNORMAL HIGH (ref 11.5–14.6)
WBC: 6.1 10*3/uL (ref 4.5–10.5)

## 2011-11-18 LAB — HEPATIC FUNCTION PANEL
ALT: 38 U/L (ref 0–53)
AST: 29 U/L (ref 0–37)
Alkaline Phosphatase: 75 U/L (ref 39–117)
Bilirubin, Direct: 0.1 mg/dL (ref 0.0–0.3)
Total Bilirubin: 0.4 mg/dL (ref 0.3–1.2)

## 2011-11-29 ENCOUNTER — Encounter: Payer: Self-pay | Admitting: Internal Medicine

## 2011-11-29 ENCOUNTER — Ambulatory Visit (INDEPENDENT_AMBULATORY_CARE_PROVIDER_SITE_OTHER): Payer: 59 | Admitting: Internal Medicine

## 2011-11-29 ENCOUNTER — Other Ambulatory Visit (INDEPENDENT_AMBULATORY_CARE_PROVIDER_SITE_OTHER): Payer: 59

## 2011-11-29 VITALS — BP 144/90 | HR 84 | Temp 98.8°F | Resp 16 | Ht 74.5 in | Wt 303.0 lb

## 2011-11-29 DIAGNOSIS — F329 Major depressive disorder, single episode, unspecified: Secondary | ICD-10-CM

## 2011-11-29 DIAGNOSIS — F3289 Other specified depressive episodes: Secondary | ICD-10-CM

## 2011-11-29 DIAGNOSIS — R5383 Other fatigue: Secondary | ICD-10-CM

## 2011-11-29 DIAGNOSIS — G4733 Obstructive sleep apnea (adult) (pediatric): Secondary | ICD-10-CM

## 2011-11-29 DIAGNOSIS — E559 Vitamin D deficiency, unspecified: Secondary | ICD-10-CM

## 2011-11-29 DIAGNOSIS — R5381 Other malaise: Secondary | ICD-10-CM

## 2011-11-29 DIAGNOSIS — Z0000 Encounter for general adult medical examination without abnormal findings: Secondary | ICD-10-CM

## 2011-11-29 DIAGNOSIS — I10 Essential (primary) hypertension: Secondary | ICD-10-CM

## 2011-11-29 MED ORDER — VERAPAMIL HCL ER 360 MG PO CP24: 360.0000 mg | ORAL_CAPSULE | Freq: Every day | ORAL | Status: DC

## 2011-11-29 MED ORDER — LORAZEPAM 1 MG PO TABS: 1.0000 mg | ORAL_TABLET | Freq: Three times a day (TID) | ORAL | Status: DC | PRN

## 2011-11-29 MED ORDER — TRIAMTERENE-HCTZ 37.5-25 MG PO CAPS: 1.0000 | ORAL_CAPSULE | Freq: Every day | ORAL | Status: DC

## 2011-11-29 MED ORDER — BUPROPION HCL ER (SR) 100 MG PO TB12: 100.0000 mg | ORAL_TABLET | Freq: Two times a day (BID) | ORAL | Status: DC

## 2011-11-29 MED ORDER — LISINOPRIL 40 MG PO TABS: 40.0000 mg | ORAL_TABLET | Freq: Every day | ORAL | Status: DC

## 2011-11-29 MED ORDER — VARDENAFIL HCL 20 MG PO TABS: 20.0000 mg | ORAL_TABLET | Freq: Every day | ORAL | Status: DC | PRN

## 2011-11-29 MED ORDER — LOVASTATIN 40 MG PO TABS: 40.0000 mg | ORAL_TABLET | Freq: Every day | ORAL | Status: DC

## 2011-11-30 ENCOUNTER — Other Ambulatory Visit: Payer: Self-pay

## 2011-11-30 ENCOUNTER — Telehealth: Payer: Self-pay | Admitting: Internal Medicine

## 2011-11-30 MED ORDER — BUPROPION HCL ER (SR) 100 MG PO TB12: 100.0000 mg | ORAL_TABLET | Freq: Two times a day (BID) | ORAL | Status: DC

## 2011-11-30 MED ORDER — VARDENAFIL HCL 20 MG PO TABS: 20.0000 mg | ORAL_TABLET | Freq: Every day | ORAL | Status: DC | PRN

## 2011-11-30 NOTE — Telephone Encounter (Signed)
Misty Stanley, please, inform patient that his testosterone was normal, good news! [place] wellbutrin a prescribed Thx

## 2011-11-30 NOTE — Telephone Encounter (Signed)
Pt informed

## 2011-12-09 ENCOUNTER — Encounter: Payer: Self-pay | Admitting: Internal Medicine

## 2011-12-09 NOTE — Assessment & Plan Note (Signed)
Check testosterone level Start Wellbutrin

## 2011-12-09 NOTE — Progress Notes (Signed)
Subjective:    Patient ID: Nicholas Carr, male    DOB: 02/06/1961, 51 y.o.   MRN: [id number]  HPI The patient is here for a wellness exam. The patient has been doing well overall without major physical or psychological issues going on lately. The patient needs to address  chronic hypertension that has been well controlled with medicines F/u anxiety. C/o fatigue, moodiness, depression.   Review of Systems  Constitutional: Negative for appetite change, fatigue and unexpected weight change.  HENT: Negative for hearing loss, nosebleeds, congestion, sore throat, sneezing, trouble swallowing and neck pain.   Eyes: Negative for itching and visual disturbance.  Respiratory: Negative for cough and chest tightness.   Cardiovascular: Negative for chest pain, palpitations and leg swelling.  Gastrointestinal: Negative for nausea, diarrhea, blood in stool and abdominal distention.  Genitourinary: Negative for frequency and hematuria.  Musculoskeletal: Negative for back pain, joint swelling and gait problem.  Skin: Positive for rash.  Neurological: Negative for dizziness, tremors, seizures, facial asymmetry, speech difficulty, weakness and numbness.  Psychiatric/Behavioral: Negative for disturbed wake/sleep cycle, dysphoric mood and agitation. The patient is not nervous/anxious.     BP Readings from Last 3 Encounters:  11/29/11 144/90  10/12/10 135/85  08/11/10 120/80   Wt Readings from Last 3 Encounters:  11/29/11 303 lb (137.44 kg)  10/12/10 304 lb (137.893 kg)  08/11/10 296 lb (134.265 kg)        Objective:   Physical Exam  Constitutional: He is oriented to person, place, and time. He appears well-developed. No distress.       Obese  HENT:  Head: Normocephalic and atraumatic.  Right Ear: External ear normal.  Left Ear: External ear normal.  Nose: Nose normal.  Mouth/Throat: Oropharynx is clear and moist. No oropharyngeal exudate.  Eyes: Conjunctivae normal and EOM are normal. Pupils  are equal, round, and reactive to light. Right eye exhibits no discharge. Left eye exhibits no discharge. No scleral icterus.  Neck: Normal range of motion. Neck supple. No JVD present. No tracheal deviation present. No thyromegaly present.  Cardiovascular: Normal rate, regular rhythm, normal heart sounds and intact distal pulses.  Exam reveals no gallop and no friction rub.   No murmur heard. Pulmonary/Chest: Effort normal and breath sounds normal. No stridor. No respiratory distress. He has no wheezes. He has no rales. He exhibits no tenderness.  Abdominal: Soft. Bowel sounds are normal. He exhibits no distension and no mass. There is no tenderness. There is no rebound and no guarding.  Musculoskeletal: Normal range of motion. He exhibits no edema and no tenderness.  Lymphadenopathy:    He has no cervical adenopathy.  Neurological: He is alert and oriented to person, place, and time. He has normal reflexes. No cranial nerve deficit. He exhibits normal muscle tone. Coordination normal.  Skin: Skin is warm and dry. Rash (ingr hair on neck) noted. He is not diaphoretic. No erythema. No pallor.  Psychiatric: He has a normal mood and affect. His behavior is normal. Judgment and thought content normal.        Lab Results  Component Value Date   WBC 6.1 11/18/2011   HGB 14.6 11/18/2011   HCT 46.3 11/18/2011   PLT 260.0 11/18/2011   CHOL 154 11/18/2011   TRIG 118.0 11/18/2011   HDL 39.70 11/18/2011   LDLDIRECT 85.6 07/02/2007   ALT 38 11/18/2011   AST 29 11/18/2011   NA 139 11/18/2011   K 4.3 11/18/2011   CL 103 11/18/2011   CREATININE 1.1  11/18/2011   BUN 16 11/18/2011   CO2 29 11/18/2011   TSH 3.17 11/18/2011   PSA 0.75 11/18/2011     Assessment & Plan:

## 2011-12-09 NOTE — Assessment & Plan Note (Signed)
Continue with current prescription therapy as reflected on the Med list.  

## 2011-12-09 NOTE — Assessment & Plan Note (Signed)
Check testosterone level Start Wellbutrin Sleep test discussed

## 2011-12-09 NOTE — Assessment & Plan Note (Signed)
We discussed age appropriate health related issues, including available/recomended screening tests and vaccinations. We discussed a need for adhering to healthy diet and exercise. Labs/EKG were reviewed/ordered. All questions were answered.   

## 2012-01-09 ENCOUNTER — Other Ambulatory Visit: Payer: Self-pay | Admitting: Internal Medicine

## 2012-03-02 ENCOUNTER — Ambulatory Visit: Payer: 59 | Admitting: Internal Medicine

## 2012-04-05 ENCOUNTER — Encounter: Payer: Self-pay | Admitting: Gastroenterology

## 2012-04-13 ENCOUNTER — Ambulatory Visit (INDEPENDENT_AMBULATORY_CARE_PROVIDER_SITE_OTHER): Payer: 59 | Admitting: Internal Medicine

## 2012-04-13 ENCOUNTER — Ambulatory Visit: Payer: 59 | Admitting: Internal Medicine

## 2012-04-13 ENCOUNTER — Encounter: Payer: Self-pay | Admitting: Internal Medicine

## 2012-04-13 VITALS — BP 138/90 | HR 80 | Temp 98.5°F | Resp 16 | Wt 306.0 lb

## 2012-04-13 DIAGNOSIS — IMO0002 Reserved for concepts with insufficient information to code with codable children: Secondary | ICD-10-CM

## 2012-04-13 DIAGNOSIS — L708 Other acne: Secondary | ICD-10-CM

## 2012-04-13 DIAGNOSIS — F515 Nightmare disorder: Secondary | ICD-10-CM | POA: Insufficient documentation

## 2012-04-13 DIAGNOSIS — I10 Essential (primary) hypertension: Secondary | ICD-10-CM

## 2012-04-13 DIAGNOSIS — L709 Acne, unspecified: Secondary | ICD-10-CM

## 2012-04-13 DIAGNOSIS — F329 Major depressive disorder, single episode, unspecified: Secondary | ICD-10-CM

## 2012-04-13 DIAGNOSIS — E785 Hyperlipidemia, unspecified: Secondary | ICD-10-CM

## 2012-04-13 MED ORDER — NORTRIPTYLINE HCL 25 MG PO CAPS: 25.0000 mg | ORAL_CAPSULE | Freq: Every day | ORAL | Status: DC

## 2012-04-13 NOTE — Progress Notes (Signed)
Subjective:    HPI F/u HTN, anxiety, high lipids. The patient is c/o nightmares. The patient needs to address  chronic hypertension that has been well controlled with medicines F/u anxiety. C/o fatigue, moodiness, depression.   Review of Systems  Constitutional: Negative for appetite change, fatigue and unexpected weight change.  HENT: Negative for hearing loss, nosebleeds, congestion, sore throat, sneezing, trouble swallowing and neck pain.   Eyes: Negative for itching and visual disturbance.  Respiratory: Negative for cough and chest tightness.   Cardiovascular: Negative for chest pain, palpitations and leg swelling.  Gastrointestinal: Negative for nausea, diarrhea, blood in stool and abdominal distention.  Genitourinary: Negative for frequency and hematuria.  Musculoskeletal: Negative for back pain, joint swelling and gait problem.  Skin: Positive for rash.  Neurological: Negative for dizziness, tremors, seizures, facial asymmetry, speech difficulty, weakness and numbness.  Psychiatric/Behavioral: Positive for sleep disturbance. Negative for suicidal ideas, dysphoric mood and agitation. The patient is nervous/anxious.     BP Readings from Last 3 Encounters:  04/13/12 138/90  11/29/11 144/90  10/12/10 135/85   Wt Readings from Last 3 Encounters:  04/13/12 306 lb (138.801 kg)  11/29/11 303 lb (137.44 kg)  10/12/10 304 lb (137.893 kg)        Objective:   Physical Exam  Constitutional: He is oriented to person, place, and time. He appears well-developed. No distress.       Obese  HENT:  Head: Normocephalic and atraumatic.  Right Ear: External ear normal.  Left Ear: External ear normal.  Nose: Nose normal.  Mouth/Throat: Oropharynx is clear and moist. No oropharyngeal exudate.  Eyes: Conjunctivae normal and EOM are normal. Pupils are equal, round, and reactive to light. Right eye exhibits no discharge. Left eye exhibits no discharge. No scleral icterus.  Neck: Normal  range of motion. Neck supple. No JVD present. No tracheal deviation present. No thyromegaly present.  Cardiovascular: Normal rate, regular rhythm, normal heart sounds and intact distal pulses.  Exam reveals no gallop and no friction rub.   No murmur heard. Pulmonary/Chest: Effort normal and breath sounds normal. No stridor. No respiratory distress. He has no wheezes. He has no rales. He exhibits no tenderness.  Abdominal: Soft. Bowel sounds are normal. He exhibits no distension and no mass. There is no tenderness. There is no rebound and no guarding.  Musculoskeletal: Normal range of motion. He exhibits no edema and no tenderness.  Lymphadenopathy:    He has no cervical adenopathy.  Neurological: He is alert and oriented to person, place, and time. He has normal reflexes. No cranial nerve deficit. He exhibits normal muscle tone. Coordination normal.  Skin: Skin is warm and dry. Rash (ingr hair on neck) noted. He is not diaphoretic. No erythema. No pallor.  Psychiatric: He has a normal mood and affect. His behavior is normal. Judgment and thought content normal.        Lab Results  Component Value Date   WBC 6.1 11/18/2011   HGB 14.6 11/18/2011   HCT 46.3 11/18/2011   PLT 260.0 11/18/2011   CHOL 154 11/18/2011   TRIG 118.0 11/18/2011   HDL 39.70 11/18/2011   LDLDIRECT 85.6 07/02/2007   ALT 38 11/18/2011   AST 29 11/18/2011   NA 139 11/18/2011   K 4.3 11/18/2011   CL 103 11/18/2011   CREATININE 1.1 11/18/2011   BUN 16 11/18/2011   CO2 29 11/18/2011   TSH 3.17 11/18/2011   PSA 0.75 11/18/2011     Assessment & Plan:

## 2012-04-13 NOTE — Assessment & Plan Note (Signed)
Continue with current prescription therapy as reflected on the Med list.  

## 2012-04-13 NOTE — Assessment & Plan Note (Signed)
D/c wellbutrin Start Nortriptyline at hs

## 2012-04-13 NOTE — Assessment & Plan Note (Signed)
Start nortriptyline D/c Wellbutrin

## 2012-07-02 ENCOUNTER — Other Ambulatory Visit: Payer: Self-pay | Admitting: Internal Medicine

## 2012-07-12 ENCOUNTER — Encounter: Payer: Self-pay | Admitting: Internal Medicine

## 2012-07-12 ENCOUNTER — Ambulatory Visit (INDEPENDENT_AMBULATORY_CARE_PROVIDER_SITE_OTHER): Payer: 59 | Admitting: Internal Medicine

## 2012-07-12 VITALS — BP 162/100 | HR 80 | Temp 98.2°F | Resp 16 | Wt 305.0 lb

## 2012-07-12 DIAGNOSIS — I10 Essential (primary) hypertension: Secondary | ICD-10-CM

## 2012-07-12 DIAGNOSIS — G4700 Insomnia, unspecified: Secondary | ICD-10-CM

## 2012-07-12 DIAGNOSIS — F329 Major depressive disorder, single episode, unspecified: Secondary | ICD-10-CM

## 2012-07-12 DIAGNOSIS — E559 Vitamin D deficiency, unspecified: Secondary | ICD-10-CM

## 2012-07-12 DIAGNOSIS — R5381 Other malaise: Secondary | ICD-10-CM

## 2012-07-12 DIAGNOSIS — E785 Hyperlipidemia, unspecified: Secondary | ICD-10-CM

## 2012-07-12 DIAGNOSIS — F3289 Other specified depressive episodes: Secondary | ICD-10-CM

## 2012-07-12 MED ORDER — CANDESARTAN CILEXETIL 32 MG PO TABS: 32.0000 mg | ORAL_TABLET | Freq: Every day | ORAL | Status: DC

## 2012-07-12 NOTE — Assessment & Plan Note (Signed)
Stop Lisinopril, start [place]and Double Triamt-HCTZ if BP is high NAS diet

## 2012-07-12 NOTE — Patient Instructions (Signed)
Stop Lisinopril, start [place]and Double Triamt-HCTZ if BP is high

## 2012-07-12 NOTE — Progress Notes (Signed)
Patient ID: Nicholas Carr, male   DOB: [birth year], 52 y.o.   MRN: [id number]   Subjective:    HPI F/u HTN, anxiety, high lipids. The patient is c/o nightmares. The patient needs to address  chronic hypertension that has been well controlled with medicines F/u anxiety. C/o fatigue, moodiness, depression.   Review of Systems  Constitutional: Negative for appetite change, fatigue and unexpected weight change.  HENT: Negative for hearing loss, nosebleeds, congestion, sore throat, sneezing, trouble swallowing and neck pain.   Eyes: Negative for itching and visual disturbance.  Respiratory: Negative for cough and chest tightness.   Cardiovascular: Negative for chest pain, palpitations and leg swelling.  Gastrointestinal: Negative for nausea, diarrhea, blood in stool and abdominal distention.  Genitourinary: Negative for frequency and hematuria.  Musculoskeletal: Negative for back pain, joint swelling and gait problem.  Skin: Positive for rash.  Neurological: Negative for dizziness, tremors, seizures, facial asymmetry, speech difficulty, weakness and numbness.  Psychiatric/Behavioral: Positive for sleep disturbance. Negative for suicidal ideas, dysphoric mood and agitation. The patient is nervous/anxious.     BP Readings from Last 3 Encounters:  07/12/12 162/100  04/13/12 138/90  11/29/11 144/90   Wt Readings from Last 3 Encounters:  07/12/12 305 lb (138.347 kg)  04/13/12 306 lb (138.801 kg)  11/29/11 303 lb (137.44 kg)        Objective:   Physical Exam  Constitutional: He is oriented to person, place, and time. He appears well-developed. No distress.  Obese  HENT:  Head: Normocephalic and atraumatic.  Right Ear: External ear normal.  Left Ear: External ear normal.  Nose: Nose normal.  Mouth/Throat: Oropharynx is clear and moist. No oropharyngeal exudate.  Eyes: Conjunctivae and EOM are normal. Pupils are equal, round, and reactive to light. Right eye exhibits no discharge. Left  eye exhibits no discharge. No scleral icterus.  Neck: Normal range of motion. Neck supple. No JVD present. No tracheal deviation present. No thyromegaly present.  Cardiovascular: Normal rate, regular rhythm, normal heart sounds and intact distal pulses.  Exam reveals no gallop and no friction rub.   No murmur heard. Pulmonary/Chest: Effort normal and breath sounds normal. No stridor. No respiratory distress. He has no wheezes. He has no rales. He exhibits no tenderness.  Abdominal: Soft. Bowel sounds are normal. He exhibits no distension and no mass. There is no tenderness. There is no rebound and no guarding.  Musculoskeletal: Normal range of motion. He exhibits no edema and no tenderness.  Lymphadenopathy:    He has no cervical adenopathy.  Neurological: He is alert and oriented to person, place, and time. He has normal reflexes. No cranial nerve deficit. He exhibits normal muscle tone. Coordination normal.  Skin: Skin is warm and dry. Rash (ingr hair on neck) noted. He is not diaphoretic. No erythema. No pallor.  Psychiatric: He has a normal mood and affect. His behavior is normal. Judgment and thought content normal.        Lab Results  Component Value Date   WBC 6.1 11/18/2011   HGB 14.6 11/18/2011   HCT 46.3 11/18/2011   PLT 260.0 11/18/2011   CHOL 154 11/18/2011   TRIG 118.0 11/18/2011   HDL 39.70 11/18/2011   LDLDIRECT 85.6 07/02/2007   ALT 38 11/18/2011   AST 29 11/18/2011   NA 139 11/18/2011   K 4.3 11/18/2011   CL 103 11/18/2011   CREATININE 1.1 11/18/2011   BUN 16 11/18/2011   CO2 29 11/18/2011   TSH 3.17 11/18/2011   PSA  0.75 11/18/2011     Assessment & Plan:

## 2012-07-12 NOTE — Assessment & Plan Note (Signed)
Better  

## 2012-07-12 NOTE — Assessment & Plan Note (Signed)
Continue with current prescription therapy as reflected on the Med list.  

## 2012-07-12 NOTE — Assessment & Plan Note (Signed)
Off rx now 

## 2012-08-29 ENCOUNTER — Other Ambulatory Visit: Payer: Self-pay | Admitting: Internal Medicine

## 2012-08-30 NOTE — Telephone Encounter (Signed)
PATIENT IS LOOKING FOR NEW RX FOR TWICE DAILY DOSING. Ok? Please advise

## 2012-08-31 ENCOUNTER — Other Ambulatory Visit: Payer: Self-pay | Admitting: *Deleted

## 2012-08-31 MED ORDER — TRIAMTERENE-HCTZ 37.5-25 MG PO CAPS: 2.0000 | ORAL_CAPSULE | Freq: Every day | ORAL | Status: DC

## 2012-08-31 NOTE — Telephone Encounter (Signed)
Pt called to check up on the rx request, pt stated that he will be out today. Please advise.

## 2012-08-31 NOTE — Telephone Encounter (Signed)
Erie Noe called again to see if RX was sent.  Dr. Posey Rea told him to double the fluid pill.  He needs another RX.  He will be out today.  Pharmacy is closed on the weekend.

## 2012-10-11 ENCOUNTER — Ambulatory Visit: Payer: 59 | Admitting: Internal Medicine

## 2012-12-24 ENCOUNTER — Other Ambulatory Visit: Payer: Self-pay | Admitting: Internal Medicine

## 2013-01-11 ENCOUNTER — Encounter: Payer: Self-pay | Admitting: Gastroenterology

## 2013-01-17 ENCOUNTER — Ambulatory Visit: Payer: 59 | Admitting: Internal Medicine

## 2013-01-31 ENCOUNTER — Other Ambulatory Visit: Payer: Self-pay

## 2013-02-26 ENCOUNTER — Encounter: Payer: Self-pay | Admitting: Gastroenterology

## 2013-03-18 ENCOUNTER — Encounter: Payer: 59 | Admitting: Gastroenterology

## 2013-04-01 ENCOUNTER — Ambulatory Visit (AMBULATORY_SURGERY_CENTER): Payer: Self-pay | Admitting: *Deleted

## 2013-04-01 VITALS — Ht 74.5 in | Wt 324.2 lb

## 2013-04-01 DIAGNOSIS — Z860100 Personal history of colon polyps, unspecified: Secondary | ICD-10-CM

## 2013-04-01 MED ORDER — MOVIPREP 100 G PO SOLR: 1.0000 | Freq: Once | ORAL | Status: DC

## 2013-04-01 NOTE — Progress Notes (Signed)
Denies allergies to eggs or soy products. Denies complications with sedation or anesthesia. 

## 2013-04-15 ENCOUNTER — Encounter: Payer: Self-pay | Admitting: Gastroenterology

## 2013-04-15 ENCOUNTER — Ambulatory Visit (AMBULATORY_SURGERY_CENTER): Payer: 59 | Admitting: Gastroenterology

## 2013-04-15 VITALS — BP 125/77 | HR 61 | Temp 97.5°F | Resp 22 | Ht 74.5 in | Wt 324.0 lb

## 2013-04-15 DIAGNOSIS — Z860100 Personal history of colon polyps, unspecified: Secondary | ICD-10-CM

## 2013-04-15 DIAGNOSIS — D126 Benign neoplasm of colon, unspecified: Secondary | ICD-10-CM

## 2013-04-15 MED ORDER — SODIUM CHLORIDE 0.9 % IV SOLN: 500.0000 mL | INTRAVENOUS | Status: DC

## 2013-04-15 NOTE — Progress Notes (Signed)
Called to room to assist during endoscopic procedure.  Patient ID and intended procedure confirmed with present staff. Received instructions for my participation in the procedure from the performing physician.  

## 2013-04-15 NOTE — Patient Instructions (Signed)
YOU HAD AN ENDOSCOPIC PROCEDURE TODAY AT THE [facility]: Refer to the procedure report that was given to you for any specific questions about what was found during the examination.  If the procedure report does not answer your questions, please call your gastroenterologist to clarify.  If you requested that your care partner not be given the details of your procedure findings, then the procedure report has been included in a sealed envelope for you to review at your convenience later.  YOU SHOULD EXPECT: Some feelings of bloating in the abdomen. Passage of more gas than usual.  Walking can help get rid of the air that was put into your GI tract during the procedure and reduce the bloating. If you had a lower endoscopy (such as a colonoscopy or flexible sigmoidoscopy) you may notice spotting of blood in your stool or on the toilet paper. If you underwent a bowel prep for your procedure, then you may not have a normal bowel movement for a few days.  DIET: Your first meal following the procedure should be a light meal and then it is ok to progress to your normal diet.  A half-sandwich or bowl of soup is an example of a good first meal.  Heavy or fried foods are harder to digest and may make you feel nauseous or bloated.  Likewise meals heavy in dairy and vegetables can cause extra gas to form and this can also increase the bloating.  Drink plenty of fluids but you should avoid alcoholic beverages for 24 hours.  ACTIVITY: Your care partner should take you home directly after the procedure.  You should plan to take it easy, moving slowly for the rest of the day.  You can resume normal activity the day after the procedure however you should NOT DRIVE or use heavy machinery for 24 hours (because of the sedation medicines used during the test).    SYMPTOMS TO REPORT IMMEDIATELY: A gastroenterologist can be reached at any hour.  During normal business hours, 8:30 AM to 5:00 PM Monday through Friday,  call [phone].  After hours and on weekends, please call the GI answering service at [phone] who will take a message and have the physician on call contact you.   Following lower endoscopy (colonoscopy or flexible sigmoidoscopy):  Excessive amounts of blood in the stool  Significant tenderness or worsening of abdominal pains  Swelling of the abdomen that is new, acute  Fever of 100F or higher  FOLLOW UP: If any biopsies were taken you will be contacted by phone or by letter within the next 1-3 weeks.  Call your gastroenterologist if you have not heard about the biopsies in 3 weeks.  Our staff will call the home number listed on your records the next business day following your procedure to check on you and address any questions or concerns that you may have at that time regarding the information given to you following your procedure. This is a courtesy call and so if there is no answer at the home number and we have not heard from you through the emergency physician on call, we will assume that you have returned to your regular daily activities without incident.  SIGNATURES/CONFIDENTIALITY: You and/or your care partner have signed paperwork which will be entered into your electronic medical record.  These signatures attest to the fact that that the information above on your After Visit Summary has been reviewed and is understood.  Full responsibility of the confidentiality of this   discharge information lies with you and/or your care-partner.  HOLD ASPIRIN AND NSAIDS FOR TWO WEEKS DUE TO CAUTERY USE.

## 2013-04-15 NOTE — Op Note (Signed)
[place]  [address] [place], [id number]   COLONOSCOPY PROCEDURE REPORT  PATIENT: Nicholas, Carr  MR#: [id number] BIRTHDATE: 06-09-1960 , 52  yrs. old GENDER: Male ENDOSCOPIST: Sable Feil, MD, [facility] REFERRED BY: PROCEDURE DATE:  04/15/2013 PROCEDURE:   Colonoscopy with snare polypectomy First Screening Colonoscopy - Avg.  risk and is 50 yrs.  old or older - No.          Polyps Removed Today? Yes. ASA CLASS:   Class III INDICATIONS: MEDICATIONS: Fentanyl-Quick Pick and Propofol (Diprivan) 400 mg IV  DESCRIPTION OF PROCEDURE:   After the risks benefits and alternatives of the procedure were thoroughly explained, informed consent was obtained.  A digital rectal exam revealed no abnormalities of the rectum.   The LB [id number]  endoscope was introduced through the anus and advanced to the cecum, which was identified by both the appendix and ileocecal valve. No adverse events experienced.   The quality of the prep was excellent, using MoviPrep  The instrument was then slowly withdrawn as the colon was fully examined.      COLON FINDINGS: A firm pedunculated polyp ranging between 5-94mm in size was found in the descending colon.  A polypectomy was performed using snare cautery.  The resection was complete and the polyp tissue was completely retrieved.  Retroflexed views revealed no abnormalities. The time to cecum=3 minutes 33 seconds. Withdrawal time=17 minutes 45 seconds.  The scope was withdrawn and the procedure completed. COMPLICATIONS: There were no complications.  ENDOSCOPIC IMPRESSION: Pedunculated polyp ranging between 5-56mm in size was found in the descending colon; polypectomy was performed using snare cautery..some bleeding but intervevtional techniques not needed,bleeding self stopped.  RECOMMENDATIONS: 1.  Repeat colonoscopy in 5 years if polyp adenomatous; otherwise 10 years 2.  Hold aspirin, aspirin products, and  anti-inflammatory medication for 2 weeks. 3.  Continue current medications   eSigned:  Sable Feil, MD, [facility] 04/15/2013 9:45 AM   cc:   PATIENT NAME:  Nicholas Carr, Nicholas Carr MR#: [id number]

## 2013-04-15 NOTE — Progress Notes (Signed)
Procedure ends, to recovery, report given and VSS. 

## 2013-04-16 ENCOUNTER — Telehealth: Payer: Self-pay | Admitting: *Deleted

## 2013-04-16 NOTE — Telephone Encounter (Signed)
  Follow up Call-  Call back number 04/15/2013  Post procedure Call Back phone  # [phone]  Permission to leave phone message Yes     Patient questions:  Do you have a fever, pain , or abdominal swelling? no Pain Score  0 *  Have you tolerated food without any problems? yes  Have you been able to return to your normal activities? yes  Do you have any questions about your discharge instructions: Diet   no Medications  no Follow up visit  no  Do you have questions or concerns about your Care? no  Actions: * If pain score is 4 or above: No action needed, pain <4.

## 2013-04-19 ENCOUNTER — Encounter: Payer: Self-pay | Admitting: Gastroenterology

## 2013-05-31 ENCOUNTER — Ambulatory Visit: Payer: 59 | Admitting: Internal Medicine

## 2013-07-03 ENCOUNTER — Other Ambulatory Visit: Payer: Self-pay | Admitting: Internal Medicine

## 2013-07-09 ENCOUNTER — Other Ambulatory Visit: Payer: Self-pay | Admitting: Internal Medicine

## 2013-09-02 ENCOUNTER — Ambulatory Visit: Payer: 59 | Admitting: Internal Medicine

## 2013-09-03 ENCOUNTER — Other Ambulatory Visit (INDEPENDENT_AMBULATORY_CARE_PROVIDER_SITE_OTHER): Payer: 59

## 2013-09-03 ENCOUNTER — Ambulatory Visit (INDEPENDENT_AMBULATORY_CARE_PROVIDER_SITE_OTHER): Payer: 59 | Admitting: Internal Medicine

## 2013-09-03 ENCOUNTER — Encounter: Payer: Self-pay | Admitting: Internal Medicine

## 2013-09-03 VITALS — BP 130/80 | HR 80 | Temp 97.6°F | Resp 16 | Wt 305.0 lb

## 2013-09-03 DIAGNOSIS — E1165 Type 2 diabetes mellitus with hyperglycemia: Secondary | ICD-10-CM

## 2013-09-03 DIAGNOSIS — E785 Hyperlipidemia, unspecified: Secondary | ICD-10-CM

## 2013-09-03 DIAGNOSIS — IMO0002 Reserved for concepts with insufficient information to code with codable children: Secondary | ICD-10-CM

## 2013-09-03 DIAGNOSIS — G4700 Insomnia, unspecified: Secondary | ICD-10-CM

## 2013-09-03 DIAGNOSIS — I10 Essential (primary) hypertension: Secondary | ICD-10-CM

## 2013-09-03 DIAGNOSIS — R3589 Other polyuria: Secondary | ICD-10-CM | POA: Insufficient documentation

## 2013-09-03 DIAGNOSIS — E119 Type 2 diabetes mellitus without complications: Secondary | ICD-10-CM | POA: Insufficient documentation

## 2013-09-03 DIAGNOSIS — IMO0001 Reserved for inherently not codable concepts without codable children: Secondary | ICD-10-CM

## 2013-09-03 DIAGNOSIS — R358 Other polyuria: Secondary | ICD-10-CM

## 2013-09-03 LAB — URINALYSIS
Bilirubin Urine: NEGATIVE
Hgb urine dipstick: NEGATIVE
Leukocytes, UA: NEGATIVE
Nitrite: NEGATIVE
PH: 6 (ref 5.0–8.0)
Total Protein, Urine: NEGATIVE
Urobilinogen, UA: 0.2 (ref 0.0–1.0)

## 2013-09-03 LAB — CBC WITH DIFFERENTIAL/PLATELET
BASOS ABS: 0 10*3/uL (ref 0.0–0.1)
Basophils Relative: 0.3 % (ref 0.0–3.0)
EOS ABS: 0 10*3/uL (ref 0.0–0.7)
Eosinophils Relative: 0.7 % (ref 0.0–5.0)
HEMATOCRIT: 44.6 % (ref 39.0–52.0)
Hemoglobin: 14.6 g/dL (ref 13.0–17.0)
LYMPHS ABS: 2.1 10*3/uL (ref 0.7–4.0)
LYMPHS PCT: 29.1 % (ref 12.0–46.0)
MCHC: 32.7 g/dL (ref 30.0–36.0)
MCV: 74.8 fl — ABNORMAL LOW (ref 78.0–100.0)
Monocytes Absolute: 0.4 10*3/uL (ref 0.1–1.0)
Monocytes Relative: 5.7 % (ref 3.0–12.0)
NEUTROS ABS: 4.6 10*3/uL (ref 1.4–7.7)
Neutrophils Relative %: 64.2 % (ref 43.0–77.0)
Platelets: 242 10*3/uL (ref 150.0–400.0)
RBC: 5.96 Mil/uL — ABNORMAL HIGH (ref 4.22–5.81)
RDW: 14.6 % (ref 11.5–15.5)
WBC: 7.1 10*3/uL (ref 4.0–10.5)

## 2013-09-03 LAB — GLUCOSE, POCT (MANUAL RESULT ENTRY): POC Glucose: 476 mg/dl — AB (ref 70–99)

## 2013-09-03 LAB — HEPATIC FUNCTION PANEL
ALT: 55 U/L — ABNORMAL HIGH (ref 0–53)
AST: 37 U/L (ref 0–37)
Albumin: 4.2 g/dL (ref 3.5–5.2)
Alkaline Phosphatase: 82 U/L (ref 39–117)
Bilirubin, Direct: 0 mg/dL (ref 0.0–0.3)
TOTAL PROTEIN: 7.9 g/dL (ref 6.0–8.3)
Total Bilirubin: 0.8 mg/dL (ref 0.2–1.2)

## 2013-09-03 LAB — TSH: TSH: 2.66 u[IU]/mL (ref 0.35–4.50)

## 2013-09-03 LAB — BASIC METABOLIC PANEL
BUN: 25 mg/dL — ABNORMAL HIGH (ref 6–23)
CHLORIDE: 94 meq/L — AB (ref 96–112)
CO2: 27 meq/L (ref 19–32)
Calcium: 10.1 mg/dL (ref 8.4–10.5)
Creatinine, Ser: 1.6 mg/dL — ABNORMAL HIGH (ref 0.4–1.5)
GFR: 59.38 mL/min — ABNORMAL LOW (ref >60.00)
Glucose, Bld: 546 mg/dL (ref 70–99)
POTASSIUM: 4.3 meq/L (ref 3.5–5.1)
SODIUM: 133 meq/L — AB (ref 135–145)

## 2013-09-03 LAB — HEMOGLOBIN A1C: Hgb A1c MFr Bld: 12.2 % — ABNORMAL HIGH (ref 4.6–6.5)

## 2013-09-03 MED ORDER — GLUCOSE BLOOD VI STRP: ORAL_STRIP | Status: DC

## 2013-09-03 MED ORDER — ONETOUCH DELICA LANCETS FINE MISC: 1.0000 | Freq: Every day | Status: DC | PRN

## 2013-09-03 MED ORDER — SITAGLIP PHOS-METFORMIN HCL ER 100-1000 MG PO TB24: ORAL_TABLET | ORAL | Status: DC

## 2013-09-03 NOTE — Assessment & Plan Note (Addendum)
New onset 6/15 Off work x 2 wks  Diab teaching Labs Janumet XR was given 100/1000 1 qd

## 2013-09-03 NOTE — Patient Instructions (Addendum)
Diabetes and Standards of Medical Care  Diabetes is complicated. You may find that your diabetes team includes a dietitian, nurse, diabetes educator, eye doctor, and more. To help everyone know what is going on and to help you get the care you deserve, the following schedule of care was developed to help keep you on track. Below are the tests, exams, vaccines, medicines, education, and plans you will need. HbA1c test This test shows how well you have controlled your glucose over the past 2 3 months. It is used to see if your diabetes management plan needs to be adjusted.   It is performed at least 2 times a year if you are meeting treatment goals.  It is performed 4 times a year if therapy has changed or if you are not meeting treatment goals. Blood pressure test  This test is performed at every routine medical visit. The goal is less than 140/90 mmHg for most people, but 130/80 mmHg in some cases. Ask your health care provider about your goal. Dental exam  Follow up with the dentist regularly. Eye exam  If you are diagnosed with type 1 diabetes as a child, get an exam upon reaching the age of 63 years or older and have had diabetes for 3 5 years. Yearly eye exams are recommended after that initial eye exam.  If you are diagnosed with type 1 diabetes as an adult, get an exam within 5 years of diagnosis and then yearly.  If you are diagnosed with type 2 diabetes, get an exam as soon as possible after the diagnosis and then yearly. Foot care exam  Visual foot exams are performed at every routine medical visit. The exams check for cuts, injuries, or other problems with the feet.  A comprehensive foot exam should be done yearly. This includes visual inspection as well as assessing foot pulses and testing for loss of sensation.  Check your feet nightly for cuts, injuries, or other problems with your feet. Tell your health care provider if anything is not healing. Kidney function test (urine  microalbumin)  This test is performed once a year.  Type 1 diabetes: The first test is performed 5 years after diagnosis.  Type 2 diabetes: The first test is performed at the time of diagnosis.  A serum creatinine and estimated glomerular filtration rate (eGFR) test is done once a year to assess the level of chronic kidney disease (CKD), if present. Lipid profile (cholesterol, HDL, LDL, triglycerides)  Performed every 5 years for most people.  The goal for LDL is less than 100 mg/dL. If you are at high risk, the goal is less than 70 mg/dL.  The goal for HDL is 40 mg/dL 50 mg/dL for men and 50 mg/dL 60 mg/dL for women. An HDL cholesterol of 60 mg/dL or higher gives some protection against heart disease.  The goal for triglycerides is less than 150 mg/dL. Influenza vaccine, pneumococcal vaccine, and hepatitis B vaccine  The influenza vaccine is recommended yearly.  The pneumococcal vaccine is generally given once in a lifetime. However, there are some instances when another vaccination is recommended. Check with your health care provider.  The hepatitis B vaccine is also recommended for adults with diabetes. Diabetes self-management education  Education is recommended at diagnosis and ongoing as needed. Treatment plan  Your treatment plan is reviewed at every medical visit. Document Released: 01/09/2009 Document Revised: 11/14/2012 Document Reviewed: 08/14/2012 [facility] Patient Information 2014 [employer].   Hold Dyazide Hold Candesartan x 3 days  then re-start Go to ER if problems

## 2013-09-03 NOTE — Progress Notes (Signed)
Pre visit review using our clinic review tool, if applicable. No additional management support is needed unless otherwise documented below in the visit note. 

## 2013-09-03 NOTE — Addendum Note (Signed)
Addended by: Cresenciano Lick on: 09/03/2013 02:20 PM   Modules accepted: Orders

## 2013-09-03 NOTE — Progress Notes (Signed)
Subjective:   C/o thirst, tiredness x 2 months. Drinking a lot of juices Urinary Frequency  This is a new problem. The current episode started more than 1 month ago. The problem occurs every urination. The problem has been unchanged. The patient is experiencing no pain. There has been no fever. He is sexually active. There is no history of pyelonephritis. Associated symptoms include frequency and urgency. Pertinent negatives include no chills, hematuria or nausea. He has tried nothing for the symptoms.     F/u HTN, anxiety, high lipids.  The patient needs to address  chronic hypertension that has been well controlled with medicines .   Review of Systems  Constitutional: Positive for fatigue. Negative for chills, appetite change and unexpected weight change.  HENT: Negative for congestion, hearing loss, nosebleeds, sneezing, sore throat and trouble swallowing.        Dry mouth  Eyes: Negative for itching and visual disturbance.  Respiratory: Negative for cough and chest tightness.   Cardiovascular: Negative for chest pain, palpitations and leg swelling.  Gastrointestinal: Negative for nausea, diarrhea, blood in stool and abdominal distention.  Genitourinary: Positive for urgency and frequency. Negative for hematuria.  Musculoskeletal: Negative for back pain, gait problem, joint swelling and neck pain.  Skin: Positive for rash.  Neurological: Negative for dizziness, tremors, seizures, facial asymmetry, speech difficulty, weakness and numbness.  Psychiatric/Behavioral: Positive for sleep disturbance. Negative for suicidal ideas, dysphoric mood and agitation. The patient is nervous/anxious.     BP Readings from Last 3 Encounters:  09/03/13 130/80  04/15/13 125/77  07/12/12 162/100   Wt Readings from Last 3 Encounters:  09/03/13 305 lb (138.347 kg)  04/15/13 324 lb (146.965 kg)  04/01/13 324 lb 3.2 oz (147.056 kg)        Objective:   Physical Exam  Constitutional: He is  oriented to person, place, and time. He appears well-developed. No distress.  NAD Obese  HENT:  Head: Normocephalic and atraumatic.  Right Ear: External ear normal.  Left Ear: External ear normal.  Nose: Nose normal.  Mouth/Throat: Oropharynx is clear and moist. No oropharyngeal exudate.  Eyes: Conjunctivae and EOM are normal. Pupils are equal, round, and reactive to light. Right eye exhibits no discharge. Left eye exhibits no discharge. No scleral icterus.  Neck: Normal range of motion. Neck supple. No JVD present. No tracheal deviation present. No thyromegaly present.  Cardiovascular: Normal rate, regular rhythm, normal heart sounds and intact distal pulses.  Exam reveals no gallop and no friction rub.   No murmur heard. Pulmonary/Chest: Effort normal and breath sounds normal. No stridor. No respiratory distress. He has no wheezes. He has no rales. He exhibits no tenderness.  Abdominal: Soft. Bowel sounds are normal. He exhibits no distension and no mass. There is no tenderness. There is no rebound and no guarding.  Musculoskeletal: Normal range of motion. He exhibits no edema and no tenderness.  Lymphadenopathy:    He has no cervical adenopathy.  Neurological: He is alert and oriented to person, place, and time. He has normal reflexes. No cranial nerve deficit. He exhibits normal muscle tone. Coordination normal.  Skin: Skin is warm and dry. No rash noted. He is not diaphoretic. No erythema. No pallor.  Psychiatric: He has a normal mood and affect. His behavior is normal. Judgment and thought content normal.        Lab Results  Component Value Date   WBC 6.1 11/18/2011   HGB 14.6 11/18/2011   HCT 46.3 11/18/2011  PLT 260.0 11/18/2011   CHOL 154 11/18/2011   TRIG 118.0 11/18/2011   HDL 39.70 11/18/2011   LDLDIRECT 85.6 07/02/2007   ALT 38 11/18/2011   AST 29 11/18/2011   NA 139 11/18/2011   K 4.3 11/18/2011   CL 103 11/18/2011   CREATININE 1.1 11/18/2011   BUN 16 11/18/2011   CO2 29  11/18/2011   TSH 3.17 11/18/2011   PSA 0.75 11/18/2011   A complex case  Assessment & Plan:

## 2013-09-03 NOTE — Assessment & Plan Note (Signed)
Aggravated by frequent urination

## 2013-09-03 NOTE — Assessment & Plan Note (Signed)
Labs CBG

## 2013-09-03 NOTE — Assessment & Plan Note (Signed)
Labs

## 2013-09-03 NOTE — Assessment & Plan Note (Addendum)
Hold Dyazide Hold Candesartan x 3 days then re-start

## 2013-09-04 ENCOUNTER — Telehealth: Payer: Self-pay | Admitting: Internal Medicine

## 2013-09-04 NOTE — Telephone Encounter (Signed)
Relevant patient education assigned to patient using Emmi. ° °

## 2013-09-13 ENCOUNTER — Encounter: Payer: Self-pay | Admitting: Internal Medicine

## 2013-09-13 ENCOUNTER — Ambulatory Visit (INDEPENDENT_AMBULATORY_CARE_PROVIDER_SITE_OTHER): Payer: 59 | Admitting: Internal Medicine

## 2013-09-13 VITALS — BP 130/80 | HR 100 | Temp 98.4°F | Resp 16 | Wt 303.0 lb

## 2013-09-13 DIAGNOSIS — IMO0002 Reserved for concepts with insufficient information to code with codable children: Secondary | ICD-10-CM

## 2013-09-13 DIAGNOSIS — IMO0001 Reserved for inherently not codable concepts without codable children: Secondary | ICD-10-CM

## 2013-09-13 DIAGNOSIS — E1165 Type 2 diabetes mellitus with hyperglycemia: Secondary | ICD-10-CM

## 2013-09-13 DIAGNOSIS — I10 Essential (primary) hypertension: Secondary | ICD-10-CM

## 2013-09-13 DIAGNOSIS — E785 Hyperlipidemia, unspecified: Secondary | ICD-10-CM

## 2013-09-13 MED ORDER — PIOGLITAZONE HCL 15 MG PO TABS: 15.0000 mg | ORAL_TABLET | Freq: Every day | ORAL | Status: DC

## 2013-09-13 NOTE — Progress Notes (Signed)
Patient ID: Nicholas Carr, male   DOB: 1960-08-08, 53 y.o.   MRN: [id number]   Subjective:   C/o thirst, tiredness x 2 months. Drinking a lot of juices Urinary Frequency  This is a new problem. The current episode started more than 1 month ago. The problem has been rapidly improving. The patient is experiencing no pain. There has been no fever. He is sexually active. Pertinent negatives include no chills, frequency, hematuria, nausea or urgency. He has tried nothing for the symptoms.     F/u nw DM2, HTN, anxiety, high lipids.   Review of Systems  Constitutional: Positive for fatigue. Negative for chills, appetite change and unexpected weight change.  HENT: Negative for congestion, hearing loss, nosebleeds, sneezing, sore throat and trouble swallowing.        Dry mouth  Eyes: Negative for itching and visual disturbance.  Respiratory: Negative for cough and chest tightness.   Cardiovascular: Negative for chest pain, palpitations and leg swelling.  Gastrointestinal: Negative for nausea, diarrhea, blood in stool and abdominal distention.  Genitourinary: Negative for urgency, frequency and hematuria.  Musculoskeletal: Negative for back pain, gait problem, joint swelling and neck pain.  Skin: Positive for rash.  Neurological: Negative for dizziness, tremors, seizures, facial asymmetry, speech difficulty, weakness and numbness.  Psychiatric/Behavioral: Positive for sleep disturbance. Negative for suicidal ideas, dysphoric mood and agitation. The patient is nervous/anxious.     BP Readings from Last 3 Encounters:  09/13/13 130/80  09/03/13 130/80  04/15/13 125/77   Wt Readings from Last 3 Encounters:  09/13/13 303 lb (137.44 kg)  09/03/13 305 lb (138.347 kg)  04/15/13 324 lb (146.965 kg)        Objective:   Physical Exam  Constitutional: He is oriented to person, place, and time. He appears well-developed. No distress.  NAD Obese  HENT:  Head: Normocephalic and atraumatic.  Right  Ear: External ear normal.  Left Ear: External ear normal.  Nose: Nose normal.  Mouth/Throat: Oropharynx is clear and moist. No oropharyngeal exudate.  Eyes: Conjunctivae and EOM are normal. Pupils are equal, round, and reactive to light. Right eye exhibits no discharge. Left eye exhibits no discharge. No scleral icterus.  Neck: Normal range of motion. Neck supple. No JVD present. No tracheal deviation present. No thyromegaly present.  Cardiovascular: Normal rate, regular rhythm, normal heart sounds and intact distal pulses.  Exam reveals no gallop and no friction rub.   No murmur heard. Pulmonary/Chest: Effort normal and breath sounds normal. No stridor. No respiratory distress. He has no wheezes. He has no rales. He exhibits no tenderness.  Abdominal: Soft. Bowel sounds are normal. He exhibits no distension and no mass. There is no tenderness. There is no rebound and no guarding.  Musculoskeletal: Normal range of motion. He exhibits no edema and no tenderness.  Lymphadenopathy:    He has no cervical adenopathy.  Neurological: He is alert and oriented to person, place, and time. He has normal reflexes. No cranial nerve deficit. He exhibits normal muscle tone. Coordination normal.  Skin: Skin is warm and dry. No rash noted. He is not diaphoretic. No erythema. No pallor.  Psychiatric: He has a normal mood and affect. His behavior is normal. Judgment and thought content normal.        Lab Results  Component Value Date   WBC 7.1 09/03/2013   HGB 14.6 09/03/2013   HCT 44.6 09/03/2013   PLT 242.0 09/03/2013   CHOL 154 11/18/2011   TRIG 118.0 11/18/2011   HDL 39.70  11/18/2011   LDLDIRECT 85.6 07/02/2007   ALT 55* 09/03/2013   AST 37 09/03/2013   NA 133* 09/03/2013   K 4.3 09/03/2013   CL 94* 09/03/2013   CREATININE 1.6* 09/03/2013   BUN 25* 09/03/2013   CO2 27 09/03/2013   TSH 2.66 09/03/2013   PSA 0.75 11/18/2011   HGBA1C 12.2* 09/03/2013     Assessment & Plan:

## 2013-09-13 NOTE — Assessment & Plan Note (Addendum)
CBGs in low 200s at home  Wt Readings from Last 3 Encounters:  09/13/13 303 lb (137.44 kg)  09/03/13 305 lb (138.347 kg)  04/15/13 324 lb (146.965 kg)   Start (add) Actos (a new medicine) if your sugars run greater than 200

## 2013-09-13 NOTE — Assessment & Plan Note (Signed)
Will test later

## 2013-09-13 NOTE — Progress Notes (Signed)
Pre visit review using our clinic review tool, if applicable. No additional management support is needed unless otherwise documented below in the visit note. 

## 2013-09-13 NOTE — Assessment & Plan Note (Signed)
Better  

## 2013-09-13 NOTE — Patient Instructions (Signed)
Start (add) Actos (a new medicine) if your sugars run greater than 200

## 2013-09-26 ENCOUNTER — Telehealth: Payer: Self-pay | Admitting: *Deleted

## 2013-09-26 DIAGNOSIS — IMO0002 Reserved for concepts with insufficient information to code with codable children: Secondary | ICD-10-CM

## 2013-09-26 DIAGNOSIS — E1165 Type 2 diabetes mellitus with hyperglycemia: Secondary | ICD-10-CM

## 2013-09-26 NOTE — Telephone Encounter (Signed)
Called patient to have him come by for a lipid panel.  Patient unable to come in at this time because he works out of town. Lipid panel ordered for next office visit.

## 2013-10-05 ENCOUNTER — Encounter: Payer: 59 | Attending: Internal Medicine

## 2013-10-05 VITALS — Ht 74.5 in | Wt 306.3 lb

## 2013-10-05 DIAGNOSIS — E119 Type 2 diabetes mellitus without complications: Secondary | ICD-10-CM | POA: Diagnosis not present

## 2013-10-05 DIAGNOSIS — Z713 Dietary counseling and surveillance: Secondary | ICD-10-CM | POA: Insufficient documentation

## 2013-10-07 NOTE — Progress Notes (Signed)
Patient was seen on 7/11/150for the complete diabetes self-management series at the [facility]. This is a part of the [facility] [employer].  Current A1c = 12.2%  Handouts given during class include:  Living Well with Diabetes book  Carb Counting and Meal Planning book  Meal Plan Card  Carbohydrate guide  Meal planning worksheet  Low Sodium Flavoring Tips  The diabetes portion plate  Low Carbohydrate Snack Suggestions  A1c to eAG Conversion Chart  Diabetes Medications  Stress Management  Diabetes Recommended Care Schedule  Diabetes Success Plan  Core Class Satisfaction Survey  The following learning objectives were met by the patient during this course:  Describe diabetes  State some common risk factors for diabetes  Defines the role of glucose and insulin  Identifies type of diabetes and pathophysiology  Describe the relationship between diabetes and cardiovascular risk  State the members of the Healthcare Team  States the rationale for glucose monitoring  State when to test glucose  State their individual Target Range  State the importance of logging glucose readings  Describe how to interpret glucose readings  Identifies A1C target  Explain the correlation between A1c and eAG values  State symptoms and treatment of high blood glucose  State symptoms and treatment of low blood glucose  Explain proper technique for glucose testing  Identifies proper sharps disposal  Describe the role of different macronutrients on glucose  Explain how carbohydrates affect blood glucose  State what foods contain the most carbohydrates  Demonstrate carbohydrate counting  Demonstrate how to read Nutrition Facts food label  Describe effects of various fats on heart health  Describe the importance of good nutrition for health and healthy eating strategies  Describe techniques for managing your shopping, cooking and meal  planning  List strategies to follow meal plan when dining out  Describe the effects of alcohol on glucose and how to use it safely   State the amount of activity recommended for healthy living   Describe activities suitable for individual needs   Identify ways to regularly incorporate activity into daily life   Identify barriers to activity and ways to over come these barriers  Identify diabetes medications being personally used and their primary action for lowering glucose and possible side effects   Describe role of stress on blood glucose and develop strategies to address psychosocial issues   Identify diabetes complications and ways to prevent them  Explain how to manage diabetes during illness   Evaluate success in meeting personal goal   Establish 2-3 goals that they will plan to diligently work on until they return for the  51-monthfollow-up visit  Goals:  Follow Diabetes Meal Plan as instructed  Eat 3 meals and 2 snacks, every 3-5 hrs  Limit carbohydrate intake to 45 grams carbohydrate/meal Limit carbohydrate intake to 15 grams carbohydrate/snack Add lean protein foods to meals/snacks  Monitor glucose levels as instructed by your doctor  Aim for 15-30 mins of physical activity daily as tolerated  Bring food record and glucose log to all healthcare visits  Your patient has established the following 4 month goals in their individualized success plan: I will count my carb choices at most meals and snacks I will increase my activity level at least 2 days a week I will take my diabetes medications as scheduled I will test my glucose at least 2 times a day, 5 days a week I will look at patterns in my record book at least 5  days a month To help manage stress I will exercise at least 2 times a week  Your patient has identified these potential barriers to change:  Motivation  Finances  Stress  Lack of family support  Your patient has identified their diabetes self-care  support plan as  [place] [facility]  American Diabetes Association web site  Family support  Plan: Follow up with [facility]

## 2013-10-28 ENCOUNTER — Other Ambulatory Visit: Payer: Self-pay | Admitting: Internal Medicine

## 2013-12-20 ENCOUNTER — Encounter: Payer: Self-pay | Admitting: Internal Medicine

## 2013-12-20 ENCOUNTER — Ambulatory Visit (INDEPENDENT_AMBULATORY_CARE_PROVIDER_SITE_OTHER): Payer: 59 | Admitting: Internal Medicine

## 2013-12-20 ENCOUNTER — Other Ambulatory Visit (INDEPENDENT_AMBULATORY_CARE_PROVIDER_SITE_OTHER): Payer: 59

## 2013-12-20 VITALS — BP 140/90 | HR 80 | Temp 98.3°F | Wt 301.0 lb

## 2013-12-20 DIAGNOSIS — I10 Essential (primary) hypertension: Secondary | ICD-10-CM

## 2013-12-20 DIAGNOSIS — G4733 Obstructive sleep apnea (adult) (pediatric): Secondary | ICD-10-CM

## 2013-12-20 DIAGNOSIS — IMO0001 Reserved for inherently not codable concepts without codable children: Secondary | ICD-10-CM

## 2013-12-20 DIAGNOSIS — IMO0002 Reserved for concepts with insufficient information to code with codable children: Secondary | ICD-10-CM

## 2013-12-20 DIAGNOSIS — E1165 Type 2 diabetes mellitus with hyperglycemia: Secondary | ICD-10-CM

## 2013-12-20 DIAGNOSIS — Z23 Encounter for immunization: Secondary | ICD-10-CM

## 2013-12-20 LAB — BASIC METABOLIC PANEL
BUN: 16 mg/dL (ref 6–23)
CO2: 25 mEq/L (ref 19–32)
Calcium: 9.7 mg/dL (ref 8.4–10.5)
Chloride: 102 mEq/L (ref 96–112)
Creatinine, Ser: 1.1 mg/dL (ref 0.4–1.5)
GFR: 87.07 mL/min (ref >60.00)
Glucose, Bld: 77 mg/dL (ref 70–99)
Potassium: 3.8 mEq/L (ref 3.5–5.1)
SODIUM: 135 meq/L (ref 135–145)

## 2013-12-20 LAB — LIPID PANEL
CHOLESTEROL: 140 mg/dL (ref 0–200)
HDL: 41.8 mg/dL (ref >39.00)
LDL Cholesterol: 76 mg/dL (ref 0–99)
NonHDL: 98.2
Total CHOL/HDL Ratio: 3
Triglycerides: 112 mg/dL (ref 0.0–149.0)
VLDL: 22.4 mg/dL (ref 0.0–40.0)

## 2013-12-20 LAB — HEMOGLOBIN A1C: HEMOGLOBIN A1C: 6.2 % (ref 4.6–6.5)

## 2013-12-20 NOTE — Assessment & Plan Note (Signed)
Continue with current prescription therapy as reflected on the Med list. BP Readings from Last 3 Encounters:  12/20/13 140/90  09/13/13 130/80  09/03/13 130/80

## 2013-12-20 NOTE — Progress Notes (Signed)
   Subjective:    HPI   F/u new DM2, HTN, anxiety, high lipids.   Review of Systems  Constitutional: Negative for appetite change, fatigue and unexpected weight change.  HENT: Negative for congestion, hearing loss, nosebleeds, sneezing, sore throat and trouble swallowing.        No dry mouth  Eyes: Negative for itching and visual disturbance.  Respiratory: Negative for cough and chest tightness.   Cardiovascular: Negative for chest pain, palpitations and leg swelling.  Gastrointestinal: Negative for diarrhea, blood in stool and abdominal distention.  Musculoskeletal: Negative for back pain, gait problem, joint swelling and neck pain.  Skin: Positive for rash.  Neurological: Negative for dizziness, tremors, seizures, facial asymmetry, speech difficulty, weakness and numbness.  Psychiatric/Behavioral: Positive for sleep disturbance. Negative for suicidal ideas, dysphoric mood and agitation. The patient is nervous/anxious.     BP Readings from Last 3 Encounters:  12/20/13 140/90  09/13/13 130/80  09/03/13 130/80   Wt Readings from Last 3 Encounters:  12/20/13 301 lb (136.533 kg)  10/07/13 306 lb 4.8 oz (138.937 kg)  09/13/13 303 lb (137.44 kg)        Objective:   Physical Exam  Constitutional: He is oriented to person, place, and time. He appears well-developed. No distress.  NAD Obese  HENT:  Head: Normocephalic and atraumatic.  Right Ear: External ear normal.  Left Ear: External ear normal.  Nose: Nose normal.  Mouth/Throat: Oropharynx is clear and moist. No oropharyngeal exudate.  Eyes: Conjunctivae and EOM are normal. Pupils are equal, round, and reactive to light. Right eye exhibits no discharge. Left eye exhibits no discharge. No scleral icterus.  Neck: Normal range of motion. Neck supple. No JVD present. No tracheal deviation present. No thyromegaly present.  Cardiovascular: Normal rate, regular rhythm, normal heart sounds and intact distal pulses.  Exam reveals  no gallop and no friction rub.   No murmur heard. Pulmonary/Chest: Effort normal and breath sounds normal. No stridor. No respiratory distress. He has no wheezes. He has no rales. He exhibits no tenderness.  Abdominal: Soft. Bowel sounds are normal. He exhibits no distension and no mass. There is no tenderness. There is no rebound and no guarding.  Musculoskeletal: Normal range of motion. He exhibits no edema and no tenderness.  Lymphadenopathy:    He has no cervical adenopathy.  Neurological: He is alert and oriented to person, place, and time. He has normal reflexes. No cranial nerve deficit. He exhibits normal muscle tone. Coordination normal.  Skin: Skin is warm and dry. No rash noted. He is not diaphoretic. No erythema. No pallor.  Psychiatric: He has a normal mood and affect. His behavior is normal. Judgment and thought content normal.        Lab Results  Component Value Date   WBC 7.1 09/03/2013   HGB 14.6 09/03/2013   HCT 44.6 09/03/2013   PLT 242.0 09/03/2013   CHOL 154 11/18/2011   TRIG 118.0 11/18/2011   HDL 39.70 11/18/2011   LDLDIRECT 85.6 07/02/2007   ALT 55* 09/03/2013   AST 37 09/03/2013   NA 133* 09/03/2013   K 4.3 09/03/2013   CL 94* 09/03/2013   CREATININE 1.6* 09/03/2013   BUN 25* 09/03/2013   CO2 27 09/03/2013   TSH 2.66 09/03/2013   PSA 0.75 11/18/2011   HGBA1C 12.2* 09/03/2013     Assessment & Plan:

## 2013-12-20 NOTE — Assessment & Plan Note (Signed)
Continue with current prescription therapy as reflected on the Med list. Doing well 

## 2013-12-20 NOTE — Progress Notes (Signed)
Pre visit review using our clinic review tool, if applicable. No additional management support is needed unless otherwise documented below in the visit note. 

## 2013-12-20 NOTE — Assessment & Plan Note (Signed)
Continue with current prescription therapy as reflected on the Med list.  

## 2013-12-23 ENCOUNTER — Other Ambulatory Visit: Payer: Self-pay | Admitting: Internal Medicine

## 2014-02-24 ENCOUNTER — Other Ambulatory Visit: Payer: Self-pay | Admitting: Internal Medicine

## 2014-04-23 ENCOUNTER — Ambulatory Visit (INDEPENDENT_AMBULATORY_CARE_PROVIDER_SITE_OTHER): Payer: 59 | Admitting: Internal Medicine

## 2014-04-23 ENCOUNTER — Encounter: Payer: Self-pay | Admitting: Internal Medicine

## 2014-04-23 VITALS — BP 164/94 | HR 80 | Temp 97.7°F | Wt 308.0 lb

## 2014-04-23 DIAGNOSIS — I10 Essential (primary) hypertension: Secondary | ICD-10-CM

## 2014-04-23 DIAGNOSIS — G4733 Obstructive sleep apnea (adult) (pediatric): Secondary | ICD-10-CM

## 2014-04-23 DIAGNOSIS — G459 Transient cerebral ischemic attack, unspecified: Secondary | ICD-10-CM | POA: Insufficient documentation

## 2014-04-23 NOTE — Assessment & Plan Note (Signed)
Sleep med consult

## 2014-04-23 NOTE — Assessment & Plan Note (Signed)
1/16 ?due to a hypertensive emergency Pt was adm overnight to [facility] last Fri with BP 200/100 and TIA like sx. C/o nightmares. Pt is using CPAP for OSA Continue with current prescription therapy as reflected on the Med list. On ASA now 81 mg/d Neurol consult

## 2014-04-23 NOTE — Assessment & Plan Note (Signed)
Pt was adm overnight to [facility] last Fri with BP 200/100 and TIA like sx. C/o nightmares. Pt is using CPAP for OSA Continue with current prescription therapy as reflected on the Med list.

## 2014-04-23 NOTE — Progress Notes (Signed)
Subjective:    HPI  Pt was adm overnight to [facility] last Fri with BP 200/100 and TIA like sx. C/o nightmares. Pt is using CPAP for OSA F/u DM2, HTN, anxiety, high lipids.   Review of Systems  Constitutional: Negative for appetite change, fatigue and unexpected weight change.  HENT: Negative for congestion, hearing loss, nosebleeds, sneezing, sore throat and trouble swallowing.        No dry mouth  Eyes: Negative for itching and visual disturbance.  Respiratory: Negative for cough and chest tightness.   Cardiovascular: Negative for chest pain, palpitations and leg swelling.  Gastrointestinal: Negative for diarrhea, blood in stool and abdominal distention.  Musculoskeletal: Negative for back pain, gait problem, joint swelling and neck pain.  Skin: Positive for rash.  Neurological: Negative for dizziness, tremors, seizures, facial asymmetry, speech difficulty, weakness and numbness.  Psychiatric/Behavioral: Positive for sleep disturbance. Negative for suicidal ideas, dysphoric mood and agitation. The patient is nervous/anxious.     BP Readings from Last 3 Encounters:  04/23/14 164/94  12/20/13 140/90  09/13/13 130/80   Wt Readings from Last 3 Encounters:  04/23/14 308 lb (139.708 kg)  12/20/13 301 lb (136.533 kg)  10/07/13 306 lb 4.8 oz (138.937 kg)        Objective:   Physical Exam  Constitutional: He is oriented to person, place, and time. He appears well-developed. No distress.  NAD Obese  HENT:  Head: Normocephalic and atraumatic.  Right Ear: External ear normal.  Left Ear: External ear normal.  Nose: Nose normal.  Mouth/Throat: Oropharynx is clear and moist. No oropharyngeal exudate.  Eyes: Conjunctivae and EOM are normal. Pupils are equal, round, and reactive to light. Right eye exhibits no discharge. Left eye exhibits no discharge. No scleral icterus.  Neck: Normal range of motion. Neck supple. No JVD present. No tracheal deviation present. No  thyromegaly present.  Cardiovascular: Normal rate, regular rhythm, normal heart sounds and intact distal pulses.  Exam reveals no gallop and no friction rub.   No murmur heard. Pulmonary/Chest: Effort normal and breath sounds normal. No stridor. No respiratory distress. He has no wheezes. He has no rales. He exhibits no tenderness.  Abdominal: Soft. Bowel sounds are normal. He exhibits no distension and no mass. There is no tenderness. There is no rebound and no guarding.  Musculoskeletal: Normal range of motion. He exhibits no edema and no tenderness.  Lymphadenopathy:    He has no cervical adenopathy.  Neurological: He is alert and oriented to person, place, and time. He has normal reflexes. No cranial nerve deficit. He exhibits normal muscle tone. Coordination normal.  Skin: Skin is warm and dry. No rash noted. He is not diaphoretic. No erythema. No pallor.  Psychiatric: He has a normal mood and affect. His behavior is normal. Judgment and thought content normal.        Lab Results  Component Value Date   WBC 7.1 09/03/2013   HGB 14.6 09/03/2013   HCT 44.6 09/03/2013   PLT 242.0 09/03/2013   CHOL 140 12/20/2013   TRIG 112.0 12/20/2013   HDL 41.80 12/20/2013   LDLDIRECT 85.6 07/02/2007   ALT 55* 09/03/2013   AST 37 09/03/2013   NA 135 12/20/2013   K 3.8 12/20/2013   CL 102 12/20/2013   CREATININE 1.1 12/20/2013   BUN 16 12/20/2013   CO2 25 12/20/2013   TSH 2.66 09/03/2013   PSA 0.75 11/18/2011   HGBA1C 6.2 12/20/2013     Assessment & Plan:

## 2014-05-16 ENCOUNTER — Telehealth: Payer: Self-pay | Admitting: *Deleted

## 2014-05-16 NOTE — Telephone Encounter (Signed)
Wife return call bck she stated that husband still having spike in his BP. Yesterday it was 149/99. Currently taking on BP med, but family is concern about BP since he has been to ER for it. Requesting md recommendation...Nicholas Carr

## 2014-05-16 NOTE — Telephone Encounter (Signed)
Left msg on triage requesting call back concerning husband BP. Called wife back no answer LMOM RTC...Nicholas Carr

## 2014-05-17 NOTE — Telephone Encounter (Signed)
Cont w/current Rx and wt loss Keep ROV Thx

## 2014-05-19 NOTE — Telephone Encounter (Signed)
Notified pt wife with md response.../lmb 

## 2014-06-05 ENCOUNTER — Telehealth: Payer: Self-pay | Admitting: Neurology

## 2014-06-05 NOTE — Telephone Encounter (Signed)
Pt canceled his NP appt for 06/09/14. Pt did not give a reason. Dr. Plonikov/referring provider was notified.

## 2014-06-06 ENCOUNTER — Ambulatory Visit (INDEPENDENT_AMBULATORY_CARE_PROVIDER_SITE_OTHER): Payer: 59 | Admitting: Internal Medicine

## 2014-06-06 ENCOUNTER — Encounter: Payer: Self-pay | Admitting: Internal Medicine

## 2014-06-06 VITALS — BP 150/80 | HR 96 | Temp 98.2°F | Wt 314.0 lb

## 2014-06-06 VITALS — BP 118/62 | HR 85 | Ht 74.5 in | Wt 314.2 lb

## 2014-06-06 DIAGNOSIS — E1165 Type 2 diabetes mellitus with hyperglycemia: Secondary | ICD-10-CM

## 2014-06-06 DIAGNOSIS — G458 Other transient cerebral ischemic attacks and related syndromes: Secondary | ICD-10-CM

## 2014-06-06 DIAGNOSIS — G4733 Obstructive sleep apnea (adult) (pediatric): Secondary | ICD-10-CM

## 2014-06-06 DIAGNOSIS — I10 Essential (primary) hypertension: Secondary | ICD-10-CM

## 2014-06-06 DIAGNOSIS — IMO0002 Reserved for concepts with insufficient information to code with codable children: Secondary | ICD-10-CM

## 2014-06-06 MED ORDER — TRIAMTERENE-HCTZ 50-25 MG PO CAPS: 1.0000 | ORAL_CAPSULE | ORAL | Status: DC

## 2014-06-06 MED ORDER — METOPROLOL SUCCINATE ER 50 MG PO TB24: 50.0000 mg | ORAL_TABLET | Freq: Every day | ORAL | Status: DC

## 2014-06-06 NOTE — Patient Instructions (Signed)
We added Toprol for high blood pressure

## 2014-06-06 NOTE — Progress Notes (Signed)
Pre visit review using our clinic review tool, if applicable. No additional management support is needed unless otherwise documented below in the visit note. 

## 2014-06-06 NOTE — Assessment & Plan Note (Signed)
On Atacand, Dyazide, Verapamil 3/6 We added Toprol for high blood pressure  On ASA

## 2014-06-06 NOTE — Assessment & Plan Note (Signed)
On Atacand, Dyazide, Verapamil 3/6 We added Toprol for high blood pressure

## 2014-06-06 NOTE — Assessment & Plan Note (Signed)
It sounds as if his machine is at least 52 years old and may need to be replaced. He does need new mask and supplies. He needs service and support from a DME company because he no longer has a connection. With his history of hypertension, CPAP is medically necessary for treating his sleep apnea.  Plan-establish DME company to take over support of his CPAP machine, evaluate need to replace it and provide [place] with a download.

## 2014-06-06 NOTE — Progress Notes (Signed)
   Subjective:    HPI   F/u new DM2, HTN, anxiety, high lipids, OSA. SBP 140-150.  Review of Systems  Constitutional: Negative for appetite change, fatigue and unexpected weight change.  HENT: Negative for congestion, hearing loss, nosebleeds, sneezing, sore throat and trouble swallowing.        No dry mouth  Eyes: Negative for itching and visual disturbance.  Respiratory: Negative for cough and chest tightness.   Cardiovascular: Negative for chest pain, palpitations and leg swelling.  Gastrointestinal: Negative for diarrhea, blood in stool and abdominal distention.  Musculoskeletal: Negative for back pain, joint swelling, gait problem and neck pain.  Skin: Positive for rash.  Neurological: Negative for dizziness, tremors, seizures, facial asymmetry, speech difficulty, weakness and numbness.  Psychiatric/Behavioral: Positive for sleep disturbance. Negative for suicidal ideas, dysphoric mood and agitation. The patient is nervous/anxious.     BP Readings from Last 3 Encounters:  06/06/14 150/80  04/23/14 164/94  12/20/13 140/90   Wt Readings from Last 3 Encounters:  06/06/14 314 lb (142.429 kg)  04/23/14 308 lb (139.708 kg)  12/20/13 301 lb (136.533 kg)        Objective:   Physical Exam  Constitutional: He is oriented to person, place, and time. He appears well-developed. No distress.  NAD Obese  HENT:  Head: Normocephalic and atraumatic.  Right Ear: External ear normal.  Left Ear: External ear normal.  Nose: Nose normal.  Mouth/Throat: Oropharynx is clear and moist. No oropharyngeal exudate.  Eyes: Conjunctivae and EOM are normal. Pupils are equal, round, and reactive to light. Right eye exhibits no discharge. Left eye exhibits no discharge. No scleral icterus.  Neck: Normal range of motion. Neck supple. No JVD present. No tracheal deviation present. No thyromegaly present.  Cardiovascular: Normal rate, regular rhythm, normal heart sounds and intact distal pulses.   Exam reveals no gallop and no friction rub.   No murmur heard. Pulmonary/Chest: Effort normal and breath sounds normal. No stridor. No respiratory distress. He has no wheezes. He has no rales. He exhibits no tenderness.  Abdominal: Soft. Bowel sounds are normal. He exhibits no distension and no mass. There is no tenderness. There is no rebound and no guarding.  Musculoskeletal: Normal range of motion. He exhibits no edema or tenderness.  Lymphadenopathy:    He has no cervical adenopathy.  Neurological: He is alert and oriented to person, place, and time. He has normal reflexes. No cranial nerve deficit. He exhibits normal muscle tone. Coordination normal.  Skin: Skin is warm and dry. No rash noted. He is not diaphoretic. No erythema. No pallor.  Psychiatric: He has a normal mood and affect. His behavior is normal. Judgment and thought content normal.        Lab Results  Component Value Date   WBC 7.1 09/03/2013   HGB 14.6 09/03/2013   HCT 44.6 09/03/2013   PLT 242.0 09/03/2013   CHOL 140 12/20/2013   TRIG 112.0 12/20/2013   HDL 41.80 12/20/2013   LDLDIRECT 85.6 07/02/2007   ALT 55* 09/03/2013   AST 37 09/03/2013   NA 135 12/20/2013   K 3.8 12/20/2013   CL 102 12/20/2013   CREATININE 1.1 12/20/2013   BUN 16 12/20/2013   CO2 25 12/20/2013   TSH 2.66 09/03/2013   PSA 0.75 11/18/2011   HGBA1C 6.2 12/20/2013     Assessment & Plan:

## 2014-06-06 NOTE — Assessment & Plan Note (Signed)
Appt w/Dr Annamaria Boots today
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
## 2014-06-06 NOTE — Patient Instructions (Signed)
Order- new DME to establish for ongoing CPAP, assess current machine for need to replace, download for pressure compliance, establish AirView if available, mask of choice, humidifier, supplies    Dx OSA  Please call as needed

## 2014-06-06 NOTE — Assessment & Plan Note (Signed)
New onset 6/15 On Metformin

## 2014-06-09 ENCOUNTER — Ambulatory Visit: Payer: 59 | Admitting: Neurology

## 2014-06-13 ENCOUNTER — Other Ambulatory Visit: Payer: Self-pay | Admitting: Internal Medicine

## 2014-06-17 ENCOUNTER — Encounter: Payer: Self-pay | Admitting: Family

## 2014-06-17 ENCOUNTER — Other Ambulatory Visit (INDEPENDENT_AMBULATORY_CARE_PROVIDER_SITE_OTHER): Payer: 59

## 2014-06-17 ENCOUNTER — Ambulatory Visit (INDEPENDENT_AMBULATORY_CARE_PROVIDER_SITE_OTHER): Payer: 59 | Admitting: Family

## 2014-06-17 VITALS — BP 154/102 | HR 82 | Temp 97.6°F | Resp 18 | Ht 74.5 in | Wt 318.0 lb

## 2014-06-17 DIAGNOSIS — I10 Essential (primary) hypertension: Secondary | ICD-10-CM | POA: Diagnosis not present

## 2014-06-17 LAB — BASIC METABOLIC PANEL
BUN: 15 mg/dL (ref 6–23)
CHLORIDE: 104 meq/L (ref 96–112)
CO2: 28 mEq/L (ref 19–32)
CREATININE: 1.23 mg/dL (ref 0.40–1.50)
Calcium: 9.1 mg/dL (ref 8.4–10.5)
GFR: 79.61 mL/min (ref >60.00)
Glucose, Bld: 116 mg/dL — ABNORMAL HIGH (ref 70–99)
Potassium: 3.8 mEq/L (ref 3.5–5.1)
Sodium: 138 mEq/L (ref 135–145)

## 2014-06-17 MED ORDER — TRIAMTERENE-HCTZ 75-50 MG PO TABS: 1.0000 | ORAL_TABLET | Freq: Every day | ORAL | Status: DC

## 2014-06-17 NOTE — Assessment & Plan Note (Addendum)
Patient experiencing increase in blood pressure. His medication regimen was recently adjusted secondary to increased urination. Since that time he has noted blood pressure increase. Restart triamterene hydrochlorothiazide 75-50. Continue candesartan, verapamil, and metoprolol at current dosages. Obtain basic metabolic panel. Follow up if symptoms fail to improve.

## 2014-06-17 NOTE — Progress Notes (Signed)
   Subjective:    Patient ID: Nicholas Carr, male    DOB: [birth year], 54 y.o.   MRN: [id number]  Chief Complaint  Patient presents with  . Hypertension    says over the last 6 months his BP has steadily been going up, has had pill changes numerous times to help    HPI:  Nicholas Carr is a 54 y.o. male who presents today for an acute visit.  Associated symptoms of increasing blood pressure has been going on for about 6 months. Indicates that he has had numerous adjustments to medication which have not helped. Currently taking cadesaratan, metoprolol, verapamil, triamterene-hydrochlorothizaide. Denies any adverse effects of medications. Does note that he hasn't been sleeping very good.   BP Readings from Last 3 Encounters:  06/17/14 154/102  06/06/14 118/62  06/06/14 150/80    Allergies  Allergen Reactions  . Lorazepam     Deep sleep  . Nortriptyline     A very deep sleep  . Wellbutrin [Bupropion]     nightmares    Current Outpatient Prescriptions on File Prior to Visit  Medication Sig Dispense Refill  . candesartan (ATACAND) 32 MG tablet TAKE 1 TABLET BY MOUTH DAILY. 90 tablet 1  . glucose blood (ONETOUCH VERIO) test strip Use as instructed 50 each 11  . lovastatin (MEVACOR) 40 MG tablet TAKE 1 TABLET BY MOUTH AT BEDTIME. 90 tablet 1  . metoprolol succinate (TOPROL-XL) 50 MG 24 hr tablet Take 1 tablet (50 mg total) by mouth daily. Take with or immediately following a meal. 30 tablet 11  . ONETOUCH DELICA LANCETS FINE MISC 1 Device by Does not apply route daily as needed. 100 each 3  . pioglitazone (ACTOS) 15 MG tablet Take 1 tablet (15 mg total) by mouth daily. 30 tablet 11  . SitaGLIPtin-MetFORMIN HCl (JANUMET XR) [phone] MG TB24 1 po qam 30 tablet 11  . triamterene-hydrochlorothiazide (DYAZIDE) 50-25 MG per capsule Take 1 capsule by mouth every morning. 30 capsule 11  . verapamil (VERELAN PM) 360 MG 24 hr capsule TAKE 1 CAPSULE BY MOUTH ONCE DAILY AT BEDTIME. 90 capsule 3    . cholecalciferol (VITAMIN D) 1000 UNITS tablet Take 1,000 Units by mouth daily.     No current facility-administered medications on file prior to visit.    Review of Systems  Eyes:       Denies changes in vision.   Respiratory: Negative for chest tightness and shortness of breath.   Cardiovascular: Negative for chest pain, palpitations and leg swelling.  Neurological: Positive for headaches.      Objective:    BP 154/102 mmHg  Pulse 82  Temp(Src) 97.6 F (36.4 C)  Resp 18  Ht 6' 2.5" (1.892 m)  Wt 318 lb (144.244 kg)  BMI 40.30 kg/m2  SpO2 99% Nursing note and vital signs reviewed.  Physical Exam  Constitutional: He is oriented to person, place, and time. He appears well-developed and well-nourished. No distress.  Cardiovascular: Normal rate, regular rhythm, normal heart sounds and intact distal pulses.   Pulmonary/Chest: Effort normal and breath sounds normal.  Neurological: He is alert and oriented to person, place, and time.  Skin: Skin is warm and dry.  Psychiatric: He has a normal mood and affect. His behavior is normal. Judgment and thought content normal.       Assessment & Plan:

## 2014-06-17 NOTE — Progress Notes (Signed)
Pre visit review using our clinic review tool, if applicable. No additional management support is needed unless otherwise documented below in the visit note. 

## 2014-06-17 NOTE — Patient Instructions (Signed)
Thank you for choosing [facility].  Summary/Instructions:  Your prescription(s) have been submitted to your pharmacy or been printed and provided for you. Please take as directed and contact our office if you believe you are having problem(s) with the medication(s) or have any questions.  If your symptoms worsen or fail to improve, please contact our office for further instruction, or in case of emergency go directly to the emergency room at the closest medical facility.     

## 2014-06-30 ENCOUNTER — Other Ambulatory Visit: Payer: Self-pay | Admitting: Internal Medicine

## 2014-07-04 ENCOUNTER — Telehealth: Payer: Self-pay | Admitting: Internal Medicine

## 2014-07-04 MED ORDER — LORAZEPAM 1 MG PO TABS: 1.0000 mg | ORAL_TABLET | Freq: Two times a day (BID) | ORAL | Status: DC | PRN

## 2014-07-04 NOTE — Telephone Encounter (Signed)
Patient wants refill for LORazepam (ATIVAN) 1 MG tablet [[id number]]. It is not on his current medication list. [facility]

## 2014-07-04 NOTE — Telephone Encounter (Signed)
Rx phoned in.   

## 2014-07-04 NOTE — Telephone Encounter (Signed)
OK to fill this prescription with additional refills x3 Thank you!  

## 2014-07-15 ENCOUNTER — Ambulatory Visit (INDEPENDENT_AMBULATORY_CARE_PROVIDER_SITE_OTHER): Payer: 59 | Admitting: Cardiovascular Disease

## 2014-07-15 ENCOUNTER — Encounter: Payer: Self-pay | Admitting: *Deleted

## 2014-07-15 ENCOUNTER — Other Ambulatory Visit: Payer: Self-pay | Admitting: Internal Medicine

## 2014-07-15 ENCOUNTER — Ambulatory Visit: Payer: 59 | Admitting: Internal Medicine

## 2014-07-15 ENCOUNTER — Encounter: Payer: Self-pay | Admitting: Cardiovascular Disease

## 2014-07-15 VITALS — BP 122/82 | HR 71 | Ht 74.5 in | Wt 315.8 lb

## 2014-07-15 DIAGNOSIS — R079 Chest pain, unspecified: Secondary | ICD-10-CM | POA: Diagnosis not present

## 2014-07-15 DIAGNOSIS — E785 Hyperlipidemia, unspecified: Secondary | ICD-10-CM | POA: Diagnosis not present

## 2014-07-15 DIAGNOSIS — I10 Essential (primary) hypertension: Secondary | ICD-10-CM | POA: Diagnosis not present

## 2014-07-15 MED ORDER — CARVEDILOL 6.25 MG PO TABS: 6.2500 mg | ORAL_TABLET | Freq: Two times a day (BID) | ORAL | Status: DC

## 2014-07-15 NOTE — Patient Instructions (Signed)
Medication Instructions: 1) Stop toprol (metoprolol succinate) 2) Start coreg (carvedilol) 6.25 mg one tablet by mouth twice daily  Labwork: - none  Procedures/Testing: - Your physician has requested that you have a renal artery duplex. During this test, an ultrasound is used to evaluate blood flow to the kidneys. Allow one hour for this exam. Do not eat after midnight the day before and avoid carbonated beverages. Take your medications as you usually do.  Follow-Up: - 1 month with Dr. Fletcher Anon  Any Additional Special Instructions Will Be Listed Below (If Applicable). - none

## 2014-07-15 NOTE — Assessment & Plan Note (Signed)
The patient has refractory hypertension. Blood pressure in the past has been reasonably controlled but this has not been the case lately. Thus, I think it's important to rule out secondary hypertension. I requested renal artery duplex for evaluation of renal artery stenosis. He already has sleep apnea but that is being treated. I discussed with him the importance of low sodium diet. Carvedilol usually has better blood pressure control that metoprolol and has  better metabolic profile in diabetics. Thus, I switched him to carvedilol 6.25 mg twice daily. Other consideration in the future would be to maximize carvedilol, wean off verapamil and consider amlodipine. Spironolactone is an option as well as chlorthalidone to replace hydrochlorothiazide.
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
## 2014-07-15 NOTE — Assessment & Plan Note (Signed)
Continue treatment with lovastatin with a target LDL of less than 100.

## 2014-07-26 ENCOUNTER — Encounter: Payer: Self-pay | Admitting: Internal Medicine

## 2014-07-26 ENCOUNTER — Ambulatory Visit (INDEPENDENT_AMBULATORY_CARE_PROVIDER_SITE_OTHER): Payer: 59 | Admitting: Internal Medicine

## 2014-07-26 VITALS — BP 148/100 | Temp 97.9°F | Wt 318.0 lb

## 2014-07-26 DIAGNOSIS — I10 Essential (primary) hypertension: Secondary | ICD-10-CM | POA: Diagnosis not present

## 2014-07-26 MED ORDER — CARVEDILOL 12.5 MG PO TABS: 12.5000 mg | ORAL_TABLET | Freq: Two times a day (BID) | ORAL | Status: DC
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
## 2014-07-26 NOTE — Assessment & Plan Note (Signed)
BP Readings from Last 3 Encounters:  07/26/14 148/100  07/15/14 122/82  06/17/14 154/102   Repeat 156/102 on right Will increase the carvedilol to 12.5 bid and then 18.75 after a few more days Due for renal artery testing

## 2014-07-26 NOTE — Patient Instructions (Signed)
Please increase the carvedilol to 12.5mg  twice a day. If the blood pressure is still over 150/95 in 5 days, increase it to 18.75 twice a day.

## 2014-07-26 NOTE — Progress Notes (Signed)
Pre visit review using our clinic review tool, if applicable. No additional management support is needed unless otherwise documented below in the visit note. 

## 2014-07-28 ENCOUNTER — Other Ambulatory Visit: Payer: Self-pay | Admitting: Cardiovascular Disease

## 2014-07-28 DIAGNOSIS — I10 Essential (primary) hypertension: Secondary | ICD-10-CM

## 2014-08-01 ENCOUNTER — Ambulatory Visit (INDEPENDENT_AMBULATORY_CARE_PROVIDER_SITE_OTHER): Payer: 59

## 2014-08-01 DIAGNOSIS — I10 Essential (primary) hypertension: Secondary | ICD-10-CM | POA: Diagnosis not present

## 2014-08-12 ENCOUNTER — Ambulatory Visit: Payer: 59 | Admitting: Internal Medicine

## 2014-08-14 ENCOUNTER — Encounter: Payer: Self-pay | Admitting: Cardiovascular Disease

## 2014-08-14 ENCOUNTER — Ambulatory Visit (INDEPENDENT_AMBULATORY_CARE_PROVIDER_SITE_OTHER): Payer: 59 | Admitting: Cardiovascular Disease

## 2014-08-14 VITALS — BP 158/102 | HR 82 | Ht 74.5 in | Wt 318.0 lb

## 2014-08-14 DIAGNOSIS — I10 Essential (primary) hypertension: Secondary | ICD-10-CM

## 2014-08-14 DIAGNOSIS — E785 Hyperlipidemia, unspecified: Secondary | ICD-10-CM | POA: Diagnosis not present

## 2014-08-14 MED ORDER — CARVEDILOL 25 MG PO TABS: 25.0000 mg | ORAL_TABLET | Freq: Two times a day (BID) | ORAL | Status: DC

## 2014-08-14 MED ORDER — AMLODIPINE BESYLATE 5 MG PO TABS: 5.0000 mg | ORAL_TABLET | Freq: Every day | ORAL | Status: DC

## 2014-08-14 MED ORDER — CARVEDILOL 12.5 MG PO TABS: 25.0000 mg | ORAL_TABLET | Freq: Two times a day (BID) | ORAL | Status: DC
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
## 2014-08-14 NOTE — Assessment & Plan Note (Signed)
Continue treatment with lovastatin with a target LDL of less than 100.

## 2014-08-14 NOTE — Patient Instructions (Signed)
Medication Instructions:  Your physician has recommended you make the following change in your medication:  1) STOP Verapamil 2) INCREASE coreg to 25mg  twice a day 3) START Amlodipine 5mg  once a day    Labwork: None  Testing/Procedures: None  Follow-Up: Your physician recommends that you schedule a follow-up appointment in: [place] with Dr. Fletcher Anon   Any Other Special Instructions Will Be Listed Below (If Applicable).

## 2014-08-14 NOTE — Assessment & Plan Note (Addendum)
The patient has refractory hypertension. Renal artery duplex showed no evidence of renal artery stenosis. He already has sleep apnea but that is being treated.  I made the following changes in his medications today : Carvedilol was increased to 25 mg twice daily. I discontinued verapamil and added amlodipine 5 mg once daily.  We still have the option of adding Spironolactone and switching hydrochlorothiazide to chlorthalidone .

## 2014-08-27 ENCOUNTER — Ambulatory Visit: Payer: 59 | Admitting: Neurology

## 2014-09-01 ENCOUNTER — Encounter: Payer: Self-pay | Admitting: Neurology

## 2014-09-08 ENCOUNTER — Other Ambulatory Visit: Payer: Self-pay | Admitting: Family

## 2014-09-18 ENCOUNTER — Encounter: Payer: Self-pay | Admitting: Cardiovascular Disease

## 2014-09-18 ENCOUNTER — Ambulatory Visit (INDEPENDENT_AMBULATORY_CARE_PROVIDER_SITE_OTHER): Payer: 59 | Admitting: Cardiovascular Disease

## 2014-09-18 VITALS — BP 150/100 | HR 74 | Ht 74.5 in | Wt 322.8 lb

## 2014-09-18 DIAGNOSIS — I10 Essential (primary) hypertension: Secondary | ICD-10-CM

## 2014-09-18 DIAGNOSIS — E785 Hyperlipidemia, unspecified: Secondary | ICD-10-CM | POA: Diagnosis not present

## 2014-09-18 DIAGNOSIS — I499 Cardiac arrhythmia, unspecified: Secondary | ICD-10-CM

## 2014-09-18 MED ORDER — SPIRONOLACTONE 25 MG PO TABS: 25.0000 mg | ORAL_TABLET | Freq: Every day | ORAL | Status: DC

## 2014-09-18 MED ORDER — CHLORTHALIDONE 25 MG PO TABS: 25.0000 mg | ORAL_TABLET | Freq: Every day | ORAL | Status: DC

## 2014-09-18 NOTE — Assessment & Plan Note (Signed)
Elevated blood pressure is likely contributing but this is likely multifactorial. I discussed with him the importance of increasing physical activities and attempting weight loss.
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
## 2014-09-18 NOTE — Patient Instructions (Addendum)
Medication Instructions:  Your physician has recommended you make the following change in your medication:  STOP taking triamterene-hydrochlorothiazide  START taking spironalactone 25mg  once per day START taking chlorthalidone 25mg  once per day   Labwork: Your physician recommends that you return for lab work in one week: BMET   Testing/Procedures: none  Follow-Up: Your physician recommends that you schedule a follow-up appointment in: one month with Dr. Fletcher Anon.    Any Other Special Instructions Will Be Listed Below (If Applicable).

## 2014-09-18 NOTE — Assessment & Plan Note (Signed)
The patient has refractory hypertension. Renal artery duplex showed no evidence of renal artery stenosis. He already has sleep apnea but that is being treated.  I made the following changes in his medications today : Blood pressure overall improved but still not optimal. Thus, I switched him from Dyazide to chlorthalidone 25 mg once daily and added spironolactone 25 mg once daily. Check basic metabolic profile in one week. Follow-up in one month with repeat labs.

## 2014-09-18 NOTE — Assessment & Plan Note (Signed)
Continue treatment with lovastatin with a target LDL of less than 100.

## 2014-09-22 ENCOUNTER — Other Ambulatory Visit: Payer: Self-pay | Admitting: Internal Medicine

## 2014-09-22 ENCOUNTER — Telehealth: Payer: Self-pay

## 2014-09-22 MED ORDER — CARVEDILOL 12.5 MG PO TABS: 25.0000 mg | ORAL_TABLET | Freq: Two times a day (BID) | ORAL | Status: DC

## 2014-09-22 MED ORDER — AMLODIPINE BESYLATE 5 MG PO TABS: 5.0000 mg | ORAL_TABLET | Freq: Every day | ORAL | Status: DC

## 2014-09-22 NOTE — Telephone Encounter (Signed)
Pt wife called states pt BP is still elevated. 167/98, this morning. Please call.

## 2014-09-22 NOTE — Telephone Encounter (Signed)
Spoke w/ pt's wife.   She reports that since pt's ov his BP has not gone below 152/98. Pt's meds were changed on 6/23 and wife would like to know if these can be adjusted. Please advise.  Thank you.

## 2014-09-22 NOTE — Telephone Encounter (Signed)
This is approximately his baseline. I agree with the changes Dr. Fletcher Anon has made. We can go up on his Norvasc to 10 mg daily and if his pulse will allow for it (pulse >60) go up on the Coreg to 25 mg bid. Continue same follow up plan with follow up labs in one month.

## 2014-09-22 NOTE — Telephone Encounter (Signed)
Attempted to contact Nicholas Carr. Her mailbox is full and unable to leave message.  Will try again later.

## 2014-09-22 NOTE — Telephone Encounter (Signed)
Spoke w/ Lorriane Shire. Advised her of Ryan's recommendation.  She verbalizes understanding and will call back w/ any questions or concerns.

## 2014-09-23 ENCOUNTER — Telehealth: Payer: Self-pay | Admitting: *Deleted

## 2014-09-23 MED ORDER — AMLODIPINE BESYLATE 10 MG PO TABS: 10.0000 mg | ORAL_TABLET | Freq: Every day | ORAL | Status: DC

## 2014-09-23 MED ORDER — CARVEDILOL 25 MG PO TABS: 25.0000 mg | ORAL_TABLET | Freq: Two times a day (BID) | ORAL | Status: DC

## 2014-09-23 MED ORDER — HYDRALAZINE HCL 50 MG PO TABS: 50.0000 mg | ORAL_TABLET | Freq: Three times a day (TID) | ORAL | Status: DC | PRN

## 2014-09-23 NOTE — Telephone Encounter (Signed)
Pt wife calling needing some clarification on instructions that were given to her for pt to pick up meds at pharmacy   Please call.

## 2014-09-23 NOTE — Addendum Note (Signed)
Addended by: Dede Query R on: 09/23/2014 11:16 AM   Modules accepted: Orders

## 2014-09-23 NOTE — Telephone Encounter (Signed)
Please see previous note.

## 2014-09-23 NOTE — Telephone Encounter (Signed)
Spoke w/ pt's wife.  Advised her of Ryan's recommendation.  She verbalizes understanding and will call back w/ any other questions or concerns.

## 2014-09-23 NOTE — Telephone Encounter (Signed)
Continue Coreg 25 mg bid.  Increase Norvasc to 10 mg daily Add hydralazine 50 mg tid prn SBP >130

## 2014-09-23 NOTE — Telephone Encounter (Signed)
Pt's wife called back asking for clarification on yesterdays recommendations.  She states that pt is already taking coreg 25 mg (he has been taking two 12.5 mg BID). She would like to know if this dose needs to be increased. Please advise.  Thank you.

## 2014-09-26 ENCOUNTER — Other Ambulatory Visit (INDEPENDENT_AMBULATORY_CARE_PROVIDER_SITE_OTHER): Payer: 59

## 2014-09-26 ENCOUNTER — Other Ambulatory Visit: Payer: 59

## 2014-09-26 DIAGNOSIS — G4733 Obstructive sleep apnea (adult) (pediatric): Secondary | ICD-10-CM | POA: Diagnosis not present

## 2014-09-26 DIAGNOSIS — G458 Other transient cerebral ischemic attacks and related syndromes: Secondary | ICD-10-CM

## 2014-09-26 DIAGNOSIS — E1165 Type 2 diabetes mellitus with hyperglycemia: Secondary | ICD-10-CM | POA: Diagnosis not present

## 2014-09-26 DIAGNOSIS — IMO0002 Reserved for concepts with insufficient information to code with codable children: Secondary | ICD-10-CM

## 2014-09-26 DIAGNOSIS — I10 Essential (primary) hypertension: Secondary | ICD-10-CM | POA: Diagnosis not present

## 2014-09-26 LAB — BASIC METABOLIC PANEL
BUN: 22 mg/dL (ref 6–23)
CALCIUM: 10 mg/dL (ref 8.4–10.5)
CO2: 25 mEq/L (ref 19–32)
Chloride: 102 mEq/L (ref 96–112)
Creatinine, Ser: 1.36 mg/dL (ref 0.40–1.50)
GFR: 70.82 mL/min (ref >60.00)
GLUCOSE: 131 mg/dL — AB (ref 70–99)
POTASSIUM: 3.8 meq/L (ref 3.5–5.1)
SODIUM: 138 meq/L (ref 135–145)

## 2014-09-26 LAB — HEMOGLOBIN A1C: Hgb A1c MFr Bld: 6.6 % — ABNORMAL HIGH (ref 4.6–6.5)

## 2014-09-30 ENCOUNTER — Other Ambulatory Visit: Payer: Self-pay | Admitting: Internal Medicine

## 2014-10-10 ENCOUNTER — Ambulatory Visit (INDEPENDENT_AMBULATORY_CARE_PROVIDER_SITE_OTHER): Payer: 59 | Admitting: Internal Medicine

## 2014-10-10 ENCOUNTER — Encounter: Payer: Self-pay | Admitting: Internal Medicine

## 2014-10-10 VITALS — BP 140/90 | HR 80 | Wt 323.0 lb

## 2014-10-10 DIAGNOSIS — G4733 Obstructive sleep apnea (adult) (pediatric): Secondary | ICD-10-CM

## 2014-10-10 DIAGNOSIS — I10 Essential (primary) hypertension: Secondary | ICD-10-CM | POA: Diagnosis not present

## 2014-10-10 DIAGNOSIS — R5382 Chronic fatigue, unspecified: Secondary | ICD-10-CM | POA: Diagnosis not present

## 2014-10-10 DIAGNOSIS — G459 Transient cerebral ischemic attack, unspecified: Secondary | ICD-10-CM | POA: Diagnosis not present

## 2014-10-10 DIAGNOSIS — E1165 Type 2 diabetes mellitus with hyperglycemia: Secondary | ICD-10-CM

## 2014-10-10 DIAGNOSIS — IMO0002 Reserved for concepts with insufficient information to code with codable children: Secondary | ICD-10-CM

## 2014-10-10 DIAGNOSIS — E785 Hyperlipidemia, unspecified: Secondary | ICD-10-CM

## 2014-10-10 MED ORDER — TADALAFIL 20 MG PO TABS: 20.0000 mg | ORAL_TABLET | Freq: Every day | ORAL | Status: DC | PRN

## 2014-10-10 NOTE — Assessment & Plan Note (Signed)
On CPAP. ?

## 2014-10-10 NOTE — Progress Notes (Signed)
Subjective:  Patient ID: Nicholas Carr, male    DOB: 1961-03-17  Age: 54 y.o. MRN: [id number]  CC: No chief complaint on file.   HPI Nicholas Carr presents for HTN, dyslipidemia, fatigue. He saw Dr Fletcher Anon. C/o GERD- taking Nexium. C/o ED  Outpatient Prescriptions Prior to Visit  Medication Sig Dispense Refill  . amLODipine (NORVASC) 10 MG tablet Take 1 tablet (10 mg total) by mouth daily. 30 tablet 6  . aspirin 81 MG tablet Take 162 mg by mouth daily.    . candesartan (ATACAND) 32 MG tablet TAKE 1 TABLET BY MOUTH DAILY. 90 tablet 1  . carvedilol (COREG) 25 MG tablet Take 1 tablet (25 mg total) by mouth 2 (two) times daily with a meal. 60 tablet 6  . chlorthalidone (HYGROTON) 25 MG tablet Take 1 tablet (25 mg total) by mouth daily. 30 tablet 5  . glucose blood (ONETOUCH VERIO) test strip Use as instructed 50 each 11  . hydrALAZINE (APRESOLINE) 50 MG tablet Take 1 tablet (50 mg total) by mouth 3 (three) times daily as needed (for systolic BP >637). 270 tablet 6  . JANUMET XR [phone] MG TB24 TAKE ONE TABLET BY MOUTH IN THE MORNING 30 tablet 11  . LORazepam (ATIVAN) 1 MG tablet Take 1-2 tablets (1-2 mg total) by mouth 2 (two) times daily as needed for anxiety. 60 tablet 3  . lovastatin (MEVACOR) 40 MG tablet TAKE 1 TABLET BY MOUTH AT BEDTIME. 90 tablet 3  . ONETOUCH DELICA LANCETS FINE MISC 1 Device by Does not apply route daily as needed. 100 each 3  . pioglitazone (ACTOS) 15 MG tablet Take 1 tablet (15 mg total) by mouth daily. (Patient taking differently: Take 15 mg by mouth as needed. ) 30 tablet 11  . spironolactone (ALDACTONE) 25 MG tablet Take 1 tablet (25 mg total) by mouth daily. 30 tablet 5  . cholecalciferol (VITAMIN D) 1000 UNITS tablet Take 1,000 Units by mouth daily.     No facility-administered medications prior to visit.    ROS Review of Systems  Objective:  BP 140/90 mmHg  Pulse 80  Wt 323 lb (146.512 kg)  SpO2 96%  BP Readings from Last 3 Encounters:  10/10/14  140/90  09/18/14 150/100  08/14/14 158/102    Wt Readings from Last 3 Encounters:  10/10/14 323 lb (146.512 kg)  09/18/14 322 lb 12 oz (146.398 kg)  08/14/14 318 lb (144.244 kg)    Physical Exam  Lab Results  Component Value Date   WBC 7.1 09/03/2013   HGB 14.6 09/03/2013   HCT 44.6 09/03/2013   PLT 242.0 09/03/2013   GLUCOSE 131* 09/26/2014   CHOL 140 12/20/2013   TRIG 112.0 12/20/2013   HDL 41.80 12/20/2013   LDLDIRECT 85.6 07/02/2007   LDLCALC 76 12/20/2013   ALT 55* 09/03/2013   AST 37 09/03/2013   NA 138 09/26/2014   K 3.8 09/26/2014   CL 102 09/26/2014   CREATININE 1.36 09/26/2014   BUN 22 09/26/2014   CO2 25 09/26/2014   TSH 2.66 09/03/2013   PSA 0.75 11/18/2011   HGBA1C 6.6* 09/26/2014    No results found.  Assessment & Plan:   Diagnoses and all orders for this visit:  Transient cerebral ischemia, unspecified transient cerebral ischemia type  Essential hypertension  Obstructive sleep apnea  Chronic fatigue  Diabetes mellitus type II, uncontrolled  Hyperlipidemia  Other orders -     tadalafil (CIALIS) 20 MG tablet; Take 1 tablet (20 mg  total) by mouth daily as needed for erectile dysfunction.  I am having Nicholas Carr start on tadalafil. I am also having him maintain his cholecalciferol, glucose blood, ONETOUCH DELICA LANCETS FINE, pioglitazone, candesartan, LORazepam, aspirin, chlorthalidone, spironolactone, JANUMET XR, carvedilol, amLODipine, hydrALAZINE, and lovastatin.  Meds ordered this encounter  Medications  . tadalafil (CIALIS) 20 MG tablet    Sig: Take 1 tablet (20 mg total) by mouth daily as needed for erectile dysfunction.    Dispense:  12 tablet    Refill:  0     Follow-up: Return in about 3 months (around 01/10/2015) for a follow-up visit.  Walker Kehr, MD

## 2014-10-10 NOTE — Patient Instructions (Signed)

## 2014-10-10 NOTE — Progress Notes (Signed)
Pre visit review using our clinic review tool, if applicable. No additional management support is needed unless otherwise documented below in the visit note. 

## 2014-10-10 NOTE — Assessment & Plan Note (Signed)
On  ASA, Lovastatin 

## 2014-10-10 NOTE — Assessment & Plan Note (Signed)
Use CPAP Multifactorian

## 2014-10-10 NOTE — Assessment & Plan Note (Signed)
New onset 6/15 On Janumet, ASA, Lovastatin

## 2014-10-10 NOTE — Assessment & Plan Note (Addendum)
No relapse  CPAP for OSA  Atacand,HCTZ, Verapamil, ASA, spironolactone

## 2014-11-06 ENCOUNTER — Ambulatory Visit: Payer: 59 | Admitting: Cardiovascular Disease

## 2014-12-22 ENCOUNTER — Other Ambulatory Visit: Payer: Self-pay | Admitting: Internal Medicine

## 2015-01-13 ENCOUNTER — Ambulatory Visit: Payer: 59 | Admitting: Internal Medicine

## 2015-01-29 ENCOUNTER — Telehealth: Payer: Self-pay

## 2015-01-29 NOTE — Telephone Encounter (Signed)
Received fax PA for [place].   Cover my meds indicated that PA request was sent because the pharmacy was trying to bill a copay card as the primary payer.  Sent fax to pharmacy to double check this.  Message sent to pt.

## 2015-03-09 ENCOUNTER — Encounter: Payer: Self-pay | Admitting: Internal Medicine

## 2015-03-09 ENCOUNTER — Ambulatory Visit (INDEPENDENT_AMBULATORY_CARE_PROVIDER_SITE_OTHER): Payer: 59 | Admitting: Internal Medicine

## 2015-03-09 ENCOUNTER — Other Ambulatory Visit: Payer: Self-pay | Admitting: Cardiovascular Disease

## 2015-03-09 VITALS — BP 108/78 | HR 86 | Wt 326.0 lb

## 2015-03-09 DIAGNOSIS — K219 Gastro-esophageal reflux disease without esophagitis: Secondary | ICD-10-CM | POA: Insufficient documentation

## 2015-03-09 DIAGNOSIS — IMO0002 Reserved for concepts with insufficient information to code with codable children: Secondary | ICD-10-CM

## 2015-03-09 DIAGNOSIS — E559 Vitamin D deficiency, unspecified: Secondary | ICD-10-CM

## 2015-03-09 DIAGNOSIS — E1165 Type 2 diabetes mellitus with hyperglycemia: Secondary | ICD-10-CM

## 2015-03-09 DIAGNOSIS — Z23 Encounter for immunization: Secondary | ICD-10-CM | POA: Diagnosis not present

## 2015-03-09 DIAGNOSIS — I10 Essential (primary) hypertension: Secondary | ICD-10-CM | POA: Diagnosis not present

## 2015-03-09 DIAGNOSIS — E1159 Type 2 diabetes mellitus with other circulatory complications: Secondary | ICD-10-CM | POA: Diagnosis not present

## 2015-03-09 DIAGNOSIS — G459 Transient cerebral ischemic attack, unspecified: Secondary | ICD-10-CM

## 2015-03-09 MED ORDER — DEXLANSOPRAZOLE 30 MG PO CPDR: 30.0000 mg | DELAYED_RELEASE_CAPSULE | Freq: Every day | ORAL | Status: DC

## 2015-03-09 NOTE — Assessment & Plan Note (Signed)
On Janumet, ASA, Lovastatin, Actos 

## 2015-03-09 NOTE — Assessment & Plan Note (Addendum)
No relapse Atacand,HCTZ, Verapamil, ASA, spironolactone

## 2015-03-09 NOTE — Progress Notes (Signed)
Subjective:  Patient ID: Nicholas Carr, male    DOB: 1960-07-16  Age: 54 y.o. MRN: [id number]  CC: No chief complaint on file.   HPI TAD EDMOND presents for DM2, HTN f/u. C/o GERD - worse w/bending over. OTC Nexium did not help much  Outpatient Prescriptions Prior to Visit  Medication Sig Dispense Refill  . amLODipine (NORVASC) 10 MG tablet Take 1 tablet (10 mg total) by mouth daily. 30 tablet 6  . aspirin 81 MG tablet Take 162 mg by mouth daily.    . candesartan (ATACAND) 32 MG tablet TAKE 1 TABLET BY MOUTH DAILY. 90 tablet 1  . carvedilol (COREG) 25 MG tablet Take 1 tablet (25 mg total) by mouth 2 (two) times daily with a meal. 60 tablet 6  . chlorthalidone (HYGROTON) 25 MG tablet Take 1 tablet (25 mg total) by mouth daily. 30 tablet 5  . glucose blood (ONETOUCH VERIO) test strip Use as instructed 50 each 11  . hydrALAZINE (APRESOLINE) 50 MG tablet Take 1 tablet (50 mg total) by mouth 3 (three) times daily as needed (for systolic BP [id number]). 270 tablet 6  . JANUMET XR [phone] MG TB24 TAKE ONE TABLET BY MOUTH IN THE MORNING 30 tablet 11  . LORazepam (ATIVAN) 1 MG tablet Take 1-2 tablets (1-2 mg total) by mouth 2 (two) times daily as needed for anxiety. 60 tablet 3  . lovastatin (MEVACOR) 40 MG tablet TAKE 1 TABLET BY MOUTH AT BEDTIME. 90 tablet 3  . ONETOUCH DELICA LANCETS FINE MISC 1 Device by Does not apply route daily as needed. 100 each 3  . pioglitazone (ACTOS) 15 MG tablet Take 1 tablet (15 mg total) by mouth daily. (Patient taking differently: Take 15 mg by mouth as needed. ) 30 tablet 11  . spironolactone (ALDACTONE) 25 MG tablet Take 1 tablet (25 mg total) by mouth daily. 30 tablet 5  . tadalafil (CIALIS) 20 MG tablet Take 1 tablet (20 mg total) by mouth daily as needed for erectile dysfunction. 12 tablet 0  . cholecalciferol (VITAMIN D) 1000 UNITS tablet Take 1,000 Units by mouth daily.     No facility-administered medications prior to visit.    ROS Review of Systems    Constitutional: Negative for appetite change, fatigue and unexpected weight change.  HENT: Negative for congestion, nosebleeds, sneezing, sore throat and trouble swallowing.   Eyes: Negative for itching and visual disturbance.  Respiratory: Positive for chest tightness. Negative for cough.   Cardiovascular: Negative for chest pain, palpitations and leg swelling.  Gastrointestinal: Negative for nausea, diarrhea, blood in stool and abdominal distention.  Genitourinary: Negative for frequency and hematuria.  Musculoskeletal: Negative for back pain, joint swelling, gait problem and neck pain.  Skin: Negative for rash.  Neurological: Negative for dizziness, tremors, speech difficulty and weakness.  Psychiatric/Behavioral: Negative for sleep disturbance, dysphoric mood and agitation. The patient is not nervous/anxious.     Objective:  BP 108/78 mmHg  Pulse 86  Wt 326 lb (147.873 kg)  SpO2 97%  BP Readings from Last 3 Encounters:  03/09/15 108/78  10/10/14 140/90  09/18/14 150/100    Wt Readings from Last 3 Encounters:  03/09/15 326 lb (147.873 kg)  10/10/14 323 lb (146.512 kg)  09/18/14 322 lb 12 oz (146.398 kg)    Physical Exam  Constitutional: He is oriented to person, place, and time. He appears well-developed. No distress.  NAD  HENT:  Mouth/Throat: Oropharynx is clear and moist.  Eyes: Conjunctivae are normal. Pupils  are equal, round, and reactive to light.  Neck: Normal range of motion. No JVD present. No thyromegaly present.  Cardiovascular: Normal rate, regular rhythm, normal heart sounds and intact distal pulses.  Exam reveals no gallop and no friction rub.   No murmur heard. Pulmonary/Chest: Effort normal and breath sounds normal. No respiratory distress. He has no wheezes. He has no rales. He exhibits no tenderness.  Abdominal: Soft. Bowel sounds are normal. He exhibits no distension and no mass. There is no tenderness. There is no rebound and no guarding.   Musculoskeletal: Normal range of motion. He exhibits no edema or tenderness.  Lymphadenopathy:    He has no cervical adenopathy.  Neurological: He is alert and oriented to person, place, and time. He has normal reflexes. No cranial nerve deficit. He exhibits normal muscle tone. He displays a negative Romberg sign. Coordination and gait normal.  Skin: Skin is warm and dry. No rash noted.  Psychiatric: He has a normal mood and affect. His behavior is normal. Judgment and thought content normal.  Obese  Lab Results  Component Value Date   WBC 7.1 09/03/2013   HGB 14.6 09/03/2013   HCT 44.6 09/03/2013   PLT 242.0 09/03/2013   GLUCOSE 131* 09/26/2014   CHOL 140 12/20/2013   TRIG 112.0 12/20/2013   HDL 41.80 12/20/2013   LDLDIRECT 85.6 07/02/2007   LDLCALC 76 12/20/2013   ALT 55* 09/03/2013   AST 37 09/03/2013   NA 138 09/26/2014   K 3.8 09/26/2014   CL 102 09/26/2014   CREATININE 1.36 09/26/2014   BUN 22 09/26/2014   CO2 25 09/26/2014   TSH 2.66 09/03/2013   PSA 0.75 11/18/2011   HGBA1C 6.6* 09/26/2014    No results found.  Assessment & Plan:   There are no diagnoses linked to this encounter. I am having Mr. Jasmin start on [place]. I am also having him maintain his cholecalciferol, glucose blood, ONETOUCH DELICA LANCETS FINE, pioglitazone, LORazepam, aspirin, chlorthalidone, spironolactone, JANUMET XR, carvedilol, amLODipine, hydrALAZINE, lovastatin, tadalafil, and candesartan.  Meds ordered this encounter  Medications  . Dexlansoprazole (DEXILANT) 30 MG capsule    Sig: Take 1 capsule (30 mg total) by mouth daily.    Dispense:  30 capsule    Refill:  11     Follow-up: No Follow-up on file.  Walker Kehr, MD

## 2015-03-09 NOTE — Assessment & Plan Note (Signed)
Atacand, HCTZ, Verapamil, ASA, spironolactone 

## 2015-03-09 NOTE — Progress Notes (Signed)
Pre visit review using our clinic review tool, if applicable. No additional management support is needed unless otherwise documented below in the visit note. 

## 2015-03-09 NOTE — Patient Instructions (Signed)

## 2015-03-09 NOTE — Assessment & Plan Note (Signed)
On Vit D 

## 2015-03-20 ENCOUNTER — Other Ambulatory Visit: Payer: Self-pay | Admitting: Internal Medicine

## 2015-04-01 ENCOUNTER — Telehealth: Payer: Self-pay | Admitting: Internal Medicine

## 2015-04-01 MED ORDER — DEXLANSOPRAZOLE 60 MG PO CPDR: 60.0000 mg | DELAYED_RELEASE_CAPSULE | Freq: Every day | ORAL | Status: DC

## 2015-04-01 NOTE — Telephone Encounter (Signed)
OK - done Thx 

## 2015-04-01 NOTE — Telephone Encounter (Signed)
Pt aware.

## 2015-04-01 NOTE — Telephone Encounter (Signed)
Pt was wondering if Dr. Camila Li can send Rx for Dexlansoprazole ([place]) 60 MG, pt said that 30 is not working for him but the 60 mg (sample that Dr. Camila Li gave him) works. Please help, send to only pharmacy on file.

## 2015-04-02 ENCOUNTER — Telehealth: Payer: Self-pay

## 2015-04-02 NOTE — Telephone Encounter (Signed)
PA key provided. Entered key but the patient information is not matching and not able to verify with insurance.  Faxed back [facility] employee pharmacy requesting patient information verification.

## 2015-04-07 ENCOUNTER — Telehealth: Payer: Self-pay

## 2015-04-07 NOTE — Telephone Encounter (Signed)
PA initiated via covermymeds. Key for PA is [facility]

## 2015-05-07 DIAGNOSIS — G4733 Obstructive sleep apnea (adult) (pediatric): Secondary | ICD-10-CM | POA: Diagnosis not present

## 2015-05-21 ENCOUNTER — Other Ambulatory Visit: Payer: Self-pay | Admitting: Physician Assistant

## 2015-05-21 ENCOUNTER — Telehealth: Payer: Self-pay | Admitting: Cardiovascular Disease

## 2015-05-21 NOTE — Telephone Encounter (Signed)
Lmov to make OD 63m fu from 09/18/14  Need this appointment in order to keep giving pt refills.

## 2015-05-25 ENCOUNTER — Telehealth: Payer: Self-pay

## 2015-05-25 NOTE — Telephone Encounter (Signed)
Error

## 2015-06-02 ENCOUNTER — Other Ambulatory Visit: Payer: Self-pay | Admitting: Internal Medicine

## 2015-06-03 NOTE — Telephone Encounter (Signed)
Lorazepam Rx printed/signed/faxed to pharmacy. See meds.

## 2015-06-03 NOTE — Telephone Encounter (Signed)
Ok to Rf in PCP's absence? Thanks! 

## 2015-06-08 ENCOUNTER — Other Ambulatory Visit (INDEPENDENT_AMBULATORY_CARE_PROVIDER_SITE_OTHER): Payer: 59

## 2015-06-08 ENCOUNTER — Ambulatory Visit (INDEPENDENT_AMBULATORY_CARE_PROVIDER_SITE_OTHER): Payer: 59 | Admitting: Internal Medicine

## 2015-06-08 ENCOUNTER — Encounter: Payer: Self-pay | Admitting: Internal Medicine

## 2015-06-08 VITALS — BP 120/82 | HR 86 | Wt 326.0 lb

## 2015-06-08 DIAGNOSIS — E1165 Type 2 diabetes mellitus with hyperglycemia: Secondary | ICD-10-CM

## 2015-06-08 DIAGNOSIS — IMO0002 Reserved for concepts with insufficient information to code with codable children: Secondary | ICD-10-CM

## 2015-06-08 DIAGNOSIS — E559 Vitamin D deficiency, unspecified: Secondary | ICD-10-CM | POA: Diagnosis not present

## 2015-06-08 DIAGNOSIS — E1159 Type 2 diabetes mellitus with other circulatory complications: Secondary | ICD-10-CM

## 2015-06-08 DIAGNOSIS — K219 Gastro-esophageal reflux disease without esophagitis: Secondary | ICD-10-CM

## 2015-06-08 DIAGNOSIS — G459 Transient cerebral ischemic attack, unspecified: Secondary | ICD-10-CM | POA: Diagnosis not present

## 2015-06-08 LAB — BASIC METABOLIC PANEL
BUN: 23 mg/dL (ref 6–23)
CO2: 26 mEq/L (ref 19–32)
CREATININE: 1.35 mg/dL (ref 0.40–1.50)
Calcium: 9.7 mg/dL (ref 8.4–10.5)
Chloride: 97 mEq/L (ref 96–112)
GFR: 71.23 mL/min (ref >60.00)
GLUCOSE: 220 mg/dL — AB (ref 70–99)
POTASSIUM: 3.9 meq/L (ref 3.5–5.1)
Sodium: 133 mEq/L — ABNORMAL LOW (ref 135–145)

## 2015-06-08 LAB — HEMOGLOBIN A1C: Hgb A1c MFr Bld: 7.9 % — ABNORMAL HIGH (ref 4.6–6.5)

## 2015-06-08 MED ORDER — AMLODIPINE BESYLATE 10 MG PO TABS: 10.0000 mg | ORAL_TABLET | Freq: Every day | ORAL | Status: DC

## 2015-06-08 MED ORDER — CHLORTHALIDONE 25 MG PO TABS: 25.0000 mg | ORAL_TABLET | Freq: Every day | ORAL | Status: DC

## 2015-06-08 MED ORDER — CARVEDILOL 25 MG PO TABS: 25.0000 mg | ORAL_TABLET | Freq: Two times a day (BID) | ORAL | Status: DC

## 2015-06-08 MED ORDER — HYDRALAZINE HCL 50 MG PO TABS: 50.0000 mg | ORAL_TABLET | Freq: Three times a day (TID) | ORAL | Status: DC | PRN

## 2015-06-08 MED ORDER — LOVASTATIN 40 MG PO TABS
40.0000 mg | ORAL_TABLET | Freq: Every day | ORAL | Status: DC
Start: 2015-06-08 — End: 2016-06-06

## 2015-06-08 MED ORDER — GLUCOSE BLOOD VI STRP: ORAL_STRIP | Status: DC

## 2015-06-08 MED ORDER — LORAZEPAM 1 MG PO TABS: ORAL_TABLET | ORAL | Status: DC

## 2015-06-08 MED ORDER — SPIRONOLACTONE 25 MG PO TABS: 25.0000 mg | ORAL_TABLET | Freq: Every day | ORAL | Status: DC

## 2015-06-08 MED ORDER — CANDESARTAN CILEXETIL 32 MG PO TABS: 32.0000 mg | ORAL_TABLET | Freq: Every day | ORAL | Status: DC

## 2015-06-08 MED ORDER — SITAGLIP PHOS-METFORMIN HCL ER 100-1000 MG PO TB24: 1.0000 | ORAL_TABLET | Freq: Every morning | ORAL | Status: DC

## 2015-06-08 MED ORDER — PIOGLITAZONE HCL 15 MG PO TABS: 15.0000 mg | ORAL_TABLET | Freq: Every day | ORAL | Status: DC

## 2015-06-08 MED ORDER — DEXLANSOPRAZOLE 60 MG PO CPDR: 60.0000 mg | DELAYED_RELEASE_CAPSULE | Freq: Every day | ORAL | Status: DC

## 2015-06-08 MED ORDER — ONETOUCH DELICA LANCETS FINE MISC: Status: DC

## 2015-06-08 NOTE — Assessment & Plan Note (Signed)
On Janumet, ASA, Lovastatin, Actos 

## 2015-06-08 NOTE — Progress Notes (Signed)
Pre visit review using our clinic review tool, if applicable. No additional management support is needed unless otherwise documented below in the visit note. 

## 2015-06-08 NOTE — Assessment & Plan Note (Signed)
On Vit D 

## 2015-06-08 NOTE — Assessment & Plan Note (Signed)
Start Dexilant 

## 2015-06-08 NOTE — Assessment & Plan Note (Signed)
Atacand,HCTZ, Verapamil, ASA, spironolactone BP Readings from Last 3 Encounters:  06/08/15 120/82  03/09/15 108/78  10/10/14 140/90

## 2015-06-08 NOTE — Progress Notes (Signed)
Subjective:  Patient ID: Nicholas Carr, male    DOB: Jan 05, 1961  Age: 55 y.o. MRN: [id number]  CC: No chief complaint on file.   HPI Nicholas Carr presents for DM, HTN, GERD f/u  Outpatient Prescriptions Prior to Visit  Medication Sig Dispense Refill  . amLODipine (NORVASC) 10 MG tablet TAKE 1 TABLET (10 MG TOTAL) BY MOUTH DAILY. 30 tablet 1  . aspirin 81 MG tablet Take 162 mg by mouth daily.    . candesartan (ATACAND) 32 MG tablet TAKE 1 TABLET BY MOUTH DAILY. 90 tablet 1  . carvedilol (COREG) 25 MG tablet Take 1 tablet (25 mg total) by mouth 2 (two) times daily with a meal. 60 tablet 6  . chlorthalidone (HYGROTON) 25 MG tablet TAKE 1 TABLET BY MOUTH DAILY 30 tablet 5  . dexlansoprazole (DEXILANT) 60 MG capsule Take 1 capsule (60 mg total) by mouth daily. 90 capsule 3  . glucose blood (ONETOUCH VERIO) test strip Use as instructed 50 each 11  . hydrALAZINE (APRESOLINE) 50 MG tablet Take 1 tablet (50 mg total) by mouth 3 (three) times daily as needed (for systolic BP 99991111). 270 tablet 6  . JANUMET XR [phone] MG TB24 TAKE ONE TABLET BY MOUTH IN THE MORNING 30 tablet 11  . LORazepam (ATIVAN) 1 MG tablet TAKE 1 TO 2 TABLETS BY MOUTH TWICE A DAY AS NEEDED 60 tablet 0  . lovastatin (MEVACOR) 40 MG tablet TAKE 1 TABLET BY MOUTH AT BEDTIME. 90 tablet 3  . ONETOUCH DELICA LANCETS FINE MISC Use to check blood sugar daily 100 each 3  . pioglitazone (ACTOS) 15 MG tablet TAKE 1 TABLET BY MOUTH ONCE DAILY 30 tablet 0  . spironolactone (ALDACTONE) 25 MG tablet TAKE 1 TABLET BY MOUTH DAILY 30 tablet 5  . tadalafil (CIALIS) 20 MG tablet Take 1 tablet (20 mg total) by mouth daily as needed for erectile dysfunction. 12 tablet 0  . cholecalciferol (VITAMIN D) 1000 UNITS tablet Take 1,000 Units by mouth daily.     No facility-administered medications prior to visit.    ROS Review of Systems  Constitutional: Negative for appetite change, fatigue and unexpected weight change.  HENT: Negative for  congestion, nosebleeds, sneezing, sore throat and trouble swallowing.   Eyes: Negative for itching and visual disturbance.  Respiratory: Negative for cough.   Cardiovascular: Negative for chest pain, palpitations and leg swelling.  Gastrointestinal: Negative for nausea, diarrhea, blood in stool and abdominal distention.  Genitourinary: Negative for frequency and hematuria.  Musculoskeletal: Negative for back pain, joint swelling, gait problem and neck pain.  Skin: Negative for rash.  Neurological: Negative for dizziness, tremors, speech difficulty and weakness.  Psychiatric/Behavioral: Negative for suicidal ideas, sleep disturbance, dysphoric mood and agitation. The patient is not nervous/anxious.     Objective:  BP 120/82 mmHg  Pulse 86  Wt 326 lb (147.873 kg)  SpO2 98%  BP Readings from Last 3 Encounters:  06/08/15 120/82  03/09/15 108/78  10/10/14 140/90    Wt Readings from Last 3 Encounters:  06/08/15 326 lb (147.873 kg)  03/09/15 326 lb (147.873 kg)  10/10/14 323 lb (146.512 kg)    Physical Exam  Constitutional: He is oriented to person, place, and time. He appears well-developed. No distress.  NAD  HENT:  Mouth/Throat: Oropharynx is clear and moist.  Eyes: Conjunctivae are normal. Pupils are equal, round, and reactive to light.  Neck: Normal range of motion. No JVD present. No thyromegaly present.  Cardiovascular: Normal rate, regular  rhythm, normal heart sounds and intact distal pulses.  Exam reveals no gallop and no friction rub.   No murmur heard. Pulmonary/Chest: Effort normal and breath sounds normal. No respiratory distress. He has no wheezes. He has no rales. He exhibits no tenderness.  Abdominal: Soft. Bowel sounds are normal. He exhibits no distension and no mass. There is no tenderness. There is no rebound and no guarding.  Musculoskeletal: Normal range of motion. He exhibits no edema or tenderness.  Lymphadenopathy:    He has no cervical adenopathy.    Neurological: He is alert and oriented to person, place, and time. He has normal reflexes. No cranial nerve deficit. He exhibits normal muscle tone. He displays a negative Romberg sign. Coordination and gait normal.  Skin: Skin is warm and dry. No rash noted.  Psychiatric: He has a normal mood and affect. His behavior is normal. Judgment and thought content normal.    Lab Results  Component Value Date   WBC 7.1 09/03/2013   HGB 14.6 09/03/2013   HCT 44.6 09/03/2013   PLT 242.0 09/03/2013   GLUCOSE 131* 09/26/2014   CHOL 140 12/20/2013   TRIG 112.0 12/20/2013   HDL 41.80 12/20/2013   LDLDIRECT 85.6 07/02/2007   LDLCALC 76 12/20/2013   ALT 55* 09/03/2013   AST 37 09/03/2013   NA 138 09/26/2014   K 3.8 09/26/2014   CL 102 09/26/2014   CREATININE 1.36 09/26/2014   BUN 22 09/26/2014   CO2 25 09/26/2014   TSH 2.66 09/03/2013   PSA 0.75 11/18/2011   HGBA1C 6.6* 09/26/2014    No results found.  Assessment & Plan:   There are no diagnoses linked to this encounter. I am having Mr. Adi maintain his cholecalciferol, glucose blood, aspirin, JANUMET XR, carvedilol, hydrALAZINE, lovastatin, tadalafil, candesartan, chlorthalidone, spironolactone, ONETOUCH DELICA LANCETS FINE, dexlansoprazole, amLODipine, pioglitazone, and LORazepam.  No orders of the defined types were placed in this encounter.     Follow-up: No Follow-up on file.  Walker Kehr, MD

## 2015-06-09 ENCOUNTER — Other Ambulatory Visit: Payer: Self-pay | Admitting: Internal Medicine

## 2015-06-09 MED ORDER — RISEDRONATE SODIUM 30 MG PO TABS: 30.0000 mg | ORAL_TABLET | ORAL | Status: DC

## 2015-06-09 MED ORDER — PIOGLITAZONE HCL 30 MG PO TABS: 30.0000 mg | ORAL_TABLET | Freq: Every day | ORAL | Status: DC

## 2015-06-17 ENCOUNTER — Encounter: Payer: Self-pay | Admitting: Cardiovascular Disease

## 2015-07-13 NOTE — Telephone Encounter (Signed)
lmov for patient to make a OD 78M FU from 09/18/14   Need this appointment in order to keep giving pt refills.

## 2015-10-08 ENCOUNTER — Other Ambulatory Visit (INDEPENDENT_AMBULATORY_CARE_PROVIDER_SITE_OTHER): Payer: 59

## 2015-10-08 ENCOUNTER — Encounter: Payer: Self-pay | Admitting: Internal Medicine

## 2015-10-08 ENCOUNTER — Ambulatory Visit (INDEPENDENT_AMBULATORY_CARE_PROVIDER_SITE_OTHER): Payer: 59 | Admitting: Internal Medicine

## 2015-10-08 ENCOUNTER — Other Ambulatory Visit: Payer: Self-pay | Admitting: *Deleted

## 2015-10-08 VITALS — BP 130/84 | HR 90 | Ht 75.0 in | Wt 324.0 lb

## 2015-10-08 DIAGNOSIS — E113292 Type 2 diabetes mellitus with mild nonproliferative diabetic retinopathy without macular edema, left eye: Secondary | ICD-10-CM | POA: Diagnosis not present

## 2015-10-08 DIAGNOSIS — R7989 Other specified abnormal findings of blood chemistry: Secondary | ICD-10-CM | POA: Diagnosis not present

## 2015-10-08 DIAGNOSIS — IMO0002 Reserved for concepts with insufficient information to code with codable children: Secondary | ICD-10-CM

## 2015-10-08 DIAGNOSIS — Z23 Encounter for immunization: Secondary | ICD-10-CM

## 2015-10-08 DIAGNOSIS — H5213 Myopia, bilateral: Secondary | ICD-10-CM | POA: Diagnosis not present

## 2015-10-08 DIAGNOSIS — E1159 Type 2 diabetes mellitus with other circulatory complications: Secondary | ICD-10-CM | POA: Diagnosis not present

## 2015-10-08 DIAGNOSIS — I10 Essential (primary) hypertension: Secondary | ICD-10-CM | POA: Diagnosis not present

## 2015-10-08 DIAGNOSIS — H52223 Regular astigmatism, bilateral: Secondary | ICD-10-CM | POA: Diagnosis not present

## 2015-10-08 DIAGNOSIS — Z0000 Encounter for general adult medical examination without abnormal findings: Secondary | ICD-10-CM | POA: Diagnosis not present

## 2015-10-08 DIAGNOSIS — E1165 Type 2 diabetes mellitus with hyperglycemia: Secondary | ICD-10-CM | POA: Diagnosis not present

## 2015-10-08 DIAGNOSIS — H11153 Pinguecula, bilateral: Secondary | ICD-10-CM | POA: Diagnosis not present

## 2015-10-08 LAB — MICROALBUMIN / CREATININE URINE RATIO
Creatinine,U: 112.1 mg/dL
MICROALB UR: 2.1 mg/dL — AB (ref 0.0–1.9)
MICROALB/CREAT RATIO: 1.9 mg/g (ref 0.0–30.0)

## 2015-10-08 LAB — HEPATIC FUNCTION PANEL
ALT: 28 U/L (ref 0–53)
AST: 20 U/L (ref 0–37)
Albumin: 4.3 g/dL (ref 3.5–5.2)
Alkaline Phosphatase: 72 U/L (ref 39–117)
Bilirubin, Direct: 0.1 mg/dL (ref 0.0–0.3)
TOTAL PROTEIN: 7.8 g/dL (ref 6.0–8.3)
Total Bilirubin: 0.3 mg/dL (ref 0.2–1.2)

## 2015-10-08 LAB — URINALYSIS
BILIRUBIN URINE: NEGATIVE
HGB URINE DIPSTICK: NEGATIVE
Ketones, ur: NEGATIVE
Leukocytes, UA: NEGATIVE
NITRITE: NEGATIVE
Specific Gravity, Urine: 1.02 (ref 1.000–1.030)
TOTAL PROTEIN, URINE-UPE24: NEGATIVE
URINE GLUCOSE: NEGATIVE
UROBILINOGEN UA: 0.2 (ref 0.0–1.0)
pH: 6 (ref 5.0–8.0)

## 2015-10-08 LAB — BASIC METABOLIC PANEL
BUN: 26 mg/dL — AB (ref 6–23)
CO2: 26 mEq/L (ref 19–32)
Calcium: 10.4 mg/dL (ref 8.4–10.5)
Chloride: 106 mEq/L (ref 96–112)
Creatinine, Ser: 1.42 mg/dL (ref 0.40–1.50)
GFR: 67.11 mL/min (ref >60.00)
Glucose, Bld: 139 mg/dL — ABNORMAL HIGH (ref 70–99)
POTASSIUM: 4.7 meq/L (ref 3.5–5.1)
SODIUM: 136 meq/L (ref 135–145)

## 2015-10-08 LAB — CBC WITH DIFFERENTIAL/PLATELET
Basophils Absolute: 0.1 10*3/uL (ref 0.0–0.1)
Basophils Relative: 0.8 % (ref 0.0–3.0)
EOS PCT: 1.3 % (ref 0.0–5.0)
Eosinophils Absolute: 0.1 10*3/uL (ref 0.0–0.7)
HCT: 40 % (ref 39.0–52.0)
HEMOGLOBIN: 13.1 g/dL (ref 13.0–17.0)
Lymphocytes Relative: 25.6 % (ref 12.0–46.0)
Lymphs Abs: 1.8 10*3/uL (ref 0.7–4.0)
MCHC: 32.6 g/dL (ref 30.0–36.0)
MCV: 74 fl — ABNORMAL LOW (ref 78.0–100.0)
MONO ABS: 0.6 10*3/uL (ref 0.1–1.0)
MONOS PCT: 7.8 % (ref 3.0–12.0)
Neutro Abs: 4.6 10*3/uL (ref 1.4–7.7)
Neutrophils Relative %: 64.5 % (ref 43.0–77.0)
Platelets: 280 10*3/uL (ref 150.0–400.0)
RBC: 5.41 Mil/uL (ref 4.22–5.81)
RDW: 15.2 % (ref 11.5–15.5)
WBC: 7.1 10*3/uL (ref 4.0–10.5)

## 2015-10-08 LAB — TSH: TSH: 4.28 u[IU]/mL (ref 0.35–4.50)

## 2015-10-08 LAB — LDL CHOLESTEROL, DIRECT: LDL DIRECT: 49 mg/dL

## 2015-10-08 LAB — LIPID PANEL
CHOLESTEROL: 138 mg/dL (ref 0–200)
HDL: 32 mg/dL — ABNORMAL LOW (ref >39.00)
NonHDL: 105.7
TRIGLYCERIDES: 367 mg/dL — AB (ref 0.0–149.0)
Total CHOL/HDL Ratio: 4
VLDL: 73.4 mg/dL — ABNORMAL HIGH (ref 0.0–40.0)

## 2015-10-08 LAB — HEMOGLOBIN A1C: Hgb A1c MFr Bld: 7 % — ABNORMAL HIGH (ref 4.6–6.5)

## 2015-10-08 LAB — PSA: PSA: 3.3 ng/mL (ref 0.10–4.00)

## 2015-10-08 NOTE — Progress Notes (Signed)
Pre visit review using our clinic review tool, if applicable. No additional management support is needed unless otherwise documented below in the visit note. 

## 2015-10-08 NOTE — Assessment & Plan Note (Addendum)
On Janumet, ASA, Lovastatin, Actos No neuropathy sx's

## 2015-10-08 NOTE — Assessment & Plan Note (Signed)
Atacand, HCTZ, Verapamil, ASA, spironolactone 

## 2015-10-08 NOTE — Assessment & Plan Note (Signed)
We discussed age appropriate health related issues, including available/recomended screening tests and vaccinations. We discussed a need for adhering to healthy diet and exercise. Labs/EKG were reviewed/ordered. All questions were answered.  Needs a DOT letter

## 2015-10-08 NOTE — Progress Notes (Signed)
Subjective:  Patient ID: Nicholas Carr, male    DOB: [birth year]  Age: 55 y.o. MRN: [id number]  CC: Annual Exam   HPI Nicholas Carr presents for a well exam. F/u DM, HTN  Outpatient Prescriptions Prior to Visit  Medication Sig Dispense Refill  . amLODipine (NORVASC) 10 MG tablet Take 1 tablet (10 mg total) by mouth daily. 90 tablet 3  . aspirin 81 MG tablet Take 162 mg by mouth daily.    . candesartan (ATACAND) 32 MG tablet Take 1 tablet (32 mg total) by mouth daily. 90 tablet 3  . carvedilol (COREG) 25 MG tablet Take 1 tablet (25 mg total) by mouth 2 (two) times daily with a meal. 180 tablet 3  . chlorthalidone (HYGROTON) 25 MG tablet Take 1 tablet (25 mg total) by mouth daily. 90 tablet 3  . dexlansoprazole (DEXILANT) 60 MG capsule Take 1 capsule (60 mg total) by mouth daily. 90 capsule 3  . glucose blood (ONETOUCH VERIO) test strip Use as instructed 100 each 5  . hydrALAZINE (APRESOLINE) 50 MG tablet Take 1 tablet (50 mg total) by mouth 3 (three) times daily as needed (for systolic BP 99991111). 270 tablet 6  . LORazepam (ATIVAN) 1 MG tablet TAKE 1 TO 2 TABLETS BY MOUTH TWICE A DAY AS NEEDED 60 tablet 0  . lovastatin (MEVACOR) 40 MG tablet Take 1 tablet (40 mg total) by mouth at bedtime. 90 tablet 3  . ONETOUCH DELICA LANCETS FINE MISC Use to check blood sugar daily 100 each 3  . pioglitazone (ACTOS) 30 MG tablet Take 1 tablet (30 mg total) by mouth daily. 90 tablet 3  . risedronate (ACTONEL) 30 MG tablet Take 1 tablet (30 mg total) by mouth every 7 (seven) days. with water on empty stomach, nothing by mouth or lie down for next 30 minutes. 30 tablet 11  . SitaGLIPtin-MetFORMIN HCl (JANUMET XR) [phone] MG TB24 Take 1 tablet by mouth every morning. 90 tablet 3  . spironolactone (ALDACTONE) 25 MG tablet Take 1 tablet (25 mg total) by mouth daily. 90 tablet 3  . tadalafil (CIALIS) 20 MG tablet Take 1 tablet (20 mg total) by mouth daily as needed for erectile dysfunction. 12 tablet 0  .  cholecalciferol (VITAMIN D) 1000 UNITS tablet Take 1,000 Units by mouth daily.     No facility-administered medications prior to visit.    ROS Review of Systems  Constitutional: Negative for appetite change, fatigue and unexpected weight change.  HENT: Negative for congestion, nosebleeds, sneezing, sore throat and trouble swallowing.   Eyes: Negative for itching and visual disturbance.  Respiratory: Negative for cough.   Cardiovascular: Negative for chest pain, palpitations and leg swelling.  Gastrointestinal: Negative for nausea, diarrhea, blood in stool and abdominal distention.  Genitourinary: Negative for frequency and hematuria.  Musculoskeletal: Negative for back pain, joint swelling, gait problem and neck pain.  Skin: Negative for rash.  Neurological: Negative for dizziness, tremors, facial asymmetry, speech difficulty and weakness.  Psychiatric/Behavioral: Negative for sleep disturbance, dysphoric mood, decreased concentration and agitation. The patient is not nervous/anxious.     Objective:  BP 130/84 mmHg  Pulse 90  Ht 6\' 3"  (1.905 m)  Wt 324 lb (146.965 kg)  BMI 40.50 kg/m2  SpO2 98%  BP Readings from Last 3 Encounters:  10/08/15 130/84  06/08/15 120/82  03/09/15 108/78    Wt Readings from Last 3 Encounters:  10/08/15 324 lb (146.965 kg)  06/08/15 326 lb (147.873 kg)  03/09/15 326 lb (  147.873 kg)    Physical Exam  Constitutional: He is oriented to person, place, and time. He appears well-developed. No distress.  NAD  HENT:  Mouth/Throat: Oropharynx is clear and moist.  Eyes: Conjunctivae are normal. Pupils are equal, round, and reactive to light.  Neck: Normal range of motion. No JVD present. No thyromegaly present.  Cardiovascular: Normal rate, regular rhythm, normal heart sounds and intact distal pulses.  Exam reveals no gallop and no friction rub.   No murmur heard. Pulmonary/Chest: Effort normal and breath sounds normal. No respiratory distress. He has  no wheezes. He has no rales. He exhibits no tenderness.  Abdominal: Soft. Bowel sounds are normal. He exhibits no distension and no mass. There is no tenderness. There is no rebound and no guarding.  Musculoskeletal: Normal range of motion. He exhibits no edema or tenderness.  Lymphadenopathy:    He has no cervical adenopathy.  Neurological: He is alert and oriented to person, place, and time. He has normal reflexes. No cranial nerve deficit. He exhibits normal muscle tone. He displays a negative Romberg sign. Coordination and gait normal.  Skin: Skin is warm and dry. No rash noted.  Psychiatric: He has a normal mood and affect. His behavior is normal. Judgment and thought content normal.    Lab Results  Component Value Date   WBC 7.1 09/03/2013   HGB 14.6 09/03/2013   HCT 44.6 09/03/2013   PLT 242.0 09/03/2013   GLUCOSE 220* 06/08/2015   CHOL 140 12/20/2013   TRIG 112.0 12/20/2013   HDL 41.80 12/20/2013   LDLDIRECT 85.6 07/02/2007   LDLCALC 76 12/20/2013   ALT 55* 09/03/2013   AST 37 09/03/2013   NA 133* 06/08/2015   K 3.9 06/08/2015   CL 97 06/08/2015   CREATININE 1.35 06/08/2015   BUN 23 06/08/2015   CO2 26 06/08/2015   TSH 2.66 09/03/2013   PSA 0.75 11/18/2011   HGBA1C 7.9* 06/08/2015    No results found.  Assessment & Plan:   There are no diagnoses linked to this encounter. I am having Mr. Santellan maintain his cholecalciferol, aspirin, tadalafil, amLODipine, chlorthalidone, spironolactone, carvedilol, candesartan, dexlansoprazole, hydrALAZINE, glucose blood, SitaGLIPtin-MetFORMIN HCl, LORazepam, lovastatin, ONETOUCH DELICA LANCETS FINE, risedronate, and pioglitazone.  No orders of the defined types were placed in this encounter.     Follow-up: No Follow-up on file.  Walker Kehr, MD

## 2015-10-09 LAB — HEPATITIS C ANTIBODY: HCV AB: NEGATIVE

## 2016-02-08 ENCOUNTER — Ambulatory Visit (INDEPENDENT_AMBULATORY_CARE_PROVIDER_SITE_OTHER): Payer: 59 | Admitting: Internal Medicine

## 2016-02-08 ENCOUNTER — Encounter: Payer: Self-pay | Admitting: Internal Medicine

## 2016-02-08 ENCOUNTER — Other Ambulatory Visit (INDEPENDENT_AMBULATORY_CARE_PROVIDER_SITE_OTHER): Payer: 59

## 2016-02-08 DIAGNOSIS — E1165 Type 2 diabetes mellitus with hyperglycemia: Secondary | ICD-10-CM

## 2016-02-08 DIAGNOSIS — I10 Essential (primary) hypertension: Secondary | ICD-10-CM

## 2016-02-08 DIAGNOSIS — IMO0002 Reserved for concepts with insufficient information to code with codable children: Secondary | ICD-10-CM

## 2016-02-08 DIAGNOSIS — E1159 Type 2 diabetes mellitus with other circulatory complications: Secondary | ICD-10-CM

## 2016-02-08 DIAGNOSIS — Z23 Encounter for immunization: Secondary | ICD-10-CM | POA: Diagnosis not present

## 2016-02-08 DIAGNOSIS — F5102 Adjustment insomnia: Secondary | ICD-10-CM

## 2016-02-08 DIAGNOSIS — E782 Mixed hyperlipidemia: Secondary | ICD-10-CM

## 2016-02-08 LAB — BASIC METABOLIC PANEL
BUN: 27 mg/dL — AB (ref 6–23)
CHLORIDE: 101 meq/L (ref 96–112)
CO2: 27 meq/L (ref 19–32)
CREATININE: 1.46 mg/dL (ref 0.40–1.50)
Calcium: 10.1 mg/dL (ref 8.4–10.5)
GFR: 64.91 mL/min (ref >60.00)
GLUCOSE: 141 mg/dL — AB (ref 70–99)
Potassium: 4 mEq/L (ref 3.5–5.1)
Sodium: 137 mEq/L (ref 135–145)

## 2016-02-08 LAB — HEMOGLOBIN A1C: HEMOGLOBIN A1C: 6.6 % — AB (ref 4.6–6.5)

## 2016-02-08 NOTE — Progress Notes (Signed)
Subjective:  Patient ID: Nicholas Carr, male    DOB: [birth year]  Age: 55 y.o. MRN: [id number]  CC: No chief complaint on file.   HPI Nicholas Carr presents for HTN, DM, dyslipidemia f/u  Outpatient Medications Prior to Visit  Medication Sig Dispense Refill  . amLODipine (NORVASC) 10 MG tablet Take 1 tablet (10 mg total) by mouth daily. 90 tablet 3  . aspirin 81 MG tablet Take 162 mg by mouth daily.    . candesartan (ATACAND) 32 MG tablet Take 1 tablet (32 mg total) by mouth daily. 90 tablet 3  . carvedilol (COREG) 25 MG tablet Take 1 tablet (25 mg total) by mouth 2 (two) times daily with a meal. 180 tablet 3  . chlorthalidone (HYGROTON) 25 MG tablet Take 1 tablet (25 mg total) by mouth daily. 90 tablet 3  . dexlansoprazole (DEXILANT) 60 MG capsule Take 1 capsule (60 mg total) by mouth daily. 90 capsule 3  . glucose blood (ONETOUCH VERIO) test strip Use as instructed 100 each 5  . hydrALAZINE (APRESOLINE) 50 MG tablet Take 1 tablet (50 mg total) by mouth 3 (three) times daily as needed (for systolic BP 99991111). 270 tablet 6  . LORazepam (ATIVAN) 1 MG tablet TAKE 1 TO 2 TABLETS BY MOUTH TWICE A DAY AS NEEDED 60 tablet 0  . lovastatin (MEVACOR) 40 MG tablet Take 1 tablet (40 mg total) by mouth at bedtime. 90 tablet 3  . ONETOUCH DELICA LANCETS FINE MISC Use to check blood sugar daily 100 each 3  . pioglitazone (ACTOS) 30 MG tablet Take 1 tablet (30 mg total) by mouth daily. 90 tablet 3  . risedronate (ACTONEL) 30 MG tablet Take 1 tablet (30 mg total) by mouth every 7 (seven) days. with water on empty stomach, nothing by mouth or lie down for next 30 minutes. 30 tablet 11  . SitaGLIPtin-MetFORMIN HCl (JANUMET XR) [id number] MG TB24 Take 1 tablet by mouth every morning. 90 tablet 3  . spironolactone (ALDACTONE) 25 MG tablet Take 1 tablet (25 mg total) by mouth daily. 90 tablet 3  . tadalafil (CIALIS) 20 MG tablet Take 1 tablet (20 mg total) by mouth daily as needed for erectile dysfunction. 12  tablet 0  . cholecalciferol (VITAMIN D) 1000 UNITS tablet Take 1,000 Units by mouth daily.     No facility-administered medications prior to visit.     ROS Review of Systems  Constitutional: Negative for appetite change, fatigue and unexpected weight change.  HENT: Negative for congestion, nosebleeds, sneezing, sore throat and trouble swallowing.   Eyes: Negative for itching and visual disturbance.  Respiratory: Negative for cough.   Cardiovascular: Negative for chest pain, palpitations and leg swelling.  Gastrointestinal: Negative for abdominal distention, blood in stool, diarrhea and nausea.  Genitourinary: Negative for frequency and hematuria.  Musculoskeletal: Negative for back pain, gait problem, joint swelling and neck pain.  Skin: Negative for rash.  Neurological: Negative for dizziness, tremors, speech difficulty and weakness.  Psychiatric/Behavioral: Negative for agitation, dysphoric mood, sleep disturbance and suicidal ideas. The patient is not nervous/anxious.     Objective:  BP 122/78   Pulse 81   Temp 97.4 F (36.3 C)   Wt (!) 321 lb (145.6 kg)   SpO2 98%   BMI 40.12 kg/m   BP Readings from Last 3 Encounters:  02/08/16 122/78  10/08/15 130/84  06/08/15 120/82    Wt Readings from Last 3 Encounters:  02/08/16 (!) 321 lb (145.6 kg)  10/08/15 Marland Kitchen)  324 lb (147 kg)  06/08/15 (!) 326 lb (147.9 kg)    Physical Exam  Constitutional: He is oriented to person, place, and time. He appears well-developed and well-nourished. No distress.  HENT:  Head: Normocephalic and atraumatic.  Right Ear: External ear normal.  Left Ear: External ear normal.  Nose: Nose normal.  Mouth/Throat: Oropharynx is clear and moist. No oropharyngeal exudate.  Eyes: Conjunctivae and EOM are normal. Pupils are equal, round, and reactive to light. Right eye exhibits no discharge. Left eye exhibits no discharge. No scleral icterus.  Neck: Normal range of motion. Neck supple. No JVD present. No  tracheal deviation present. No thyromegaly present.  Cardiovascular: Normal rate, regular rhythm, normal heart sounds and intact distal pulses.  Exam reveals no gallop and no friction rub.   No murmur heard. Pulmonary/Chest: Effort normal and breath sounds normal. No stridor. No respiratory distress. He has no wheezes. He has no rales. He exhibits no tenderness.  Abdominal: Soft. Bowel sounds are normal. He exhibits no distension and no mass. There is no tenderness. There is no rebound and no guarding.  Genitourinary: Rectum normal, prostate normal and penis normal. Rectal exam shows guaiac negative stool. No penile tenderness.  Musculoskeletal: Normal range of motion. He exhibits no edema or tenderness.  Lymphadenopathy:    He has no cervical adenopathy.  Neurological: He is alert and oriented to person, place, and time. He has normal reflexes. No cranial nerve deficit. He exhibits normal muscle tone. Coordination abnormal.  Skin: Skin is warm and dry. No rash noted. He is not diaphoretic. No erythema. No pallor.  Psychiatric: He has a normal mood and affect. His behavior is normal. Judgment and thought content normal.    Lab Results  Component Value Date   WBC 7.1 10/08/2015   HGB 13.1 10/08/2015   HCT 40.0 10/08/2015   PLT 280.0 10/08/2015   GLUCOSE 139 (H) 10/08/2015   CHOL 138 10/08/2015   TRIG 367.0 (H) 10/08/2015   HDL 32.00 (L) 10/08/2015   LDLDIRECT 49.0 10/08/2015   LDLCALC 76 12/20/2013   ALT 28 10/08/2015   AST 20 10/08/2015   NA 136 10/08/2015   K 4.7 10/08/2015   CL 106 10/08/2015   CREATININE 1.42 10/08/2015   BUN 26 (H) 10/08/2015   CO2 26 10/08/2015   TSH 4.28 10/08/2015   PSA 3.30 10/08/2015   HGBA1C 7.0 (H) 10/08/2015   MICROALBUR 2.1 (H) 10/08/2015    No results found.  Assessment & Plan:   There are no diagnoses linked to this encounter. I am having Nicholas Carr maintain his cholecalciferol, aspirin, tadalafil, amLODipine, chlorthalidone, spironolactone,  carvedilol, candesartan, dexlansoprazole, hydrALAZINE, glucose blood, SitaGLIPtin-MetFORMIN HCl, LORazepam, lovastatin, ONETOUCH DELICA LANCETS FINE, risedronate, and pioglitazone.  No orders of the defined types were placed in this encounter.    Follow-up: No Follow-up on file.  Walker Kehr, MD

## 2016-02-08 NOTE — Assessment & Plan Note (Signed)
Janumet, ASA, Lovastatin, Actos Labs

## 2016-02-08 NOTE — Assessment & Plan Note (Signed)
Grieving discussed - cousin died

## 2016-02-08 NOTE — Assessment & Plan Note (Signed)
Lovastatin 

## 2016-02-08 NOTE — Progress Notes (Signed)
Pre visit review using our clinic review tool, if applicable. No additional management support is needed unless otherwise documented below in the visit note. 

## 2016-02-08 NOTE — Assessment & Plan Note (Signed)
Atacand, HCT, Verapamil, ASA, Spironolactone

## 2016-06-06 ENCOUNTER — Other Ambulatory Visit: Payer: Self-pay | Admitting: Internal Medicine

## 2016-06-07 ENCOUNTER — Telehealth: Payer: Self-pay | Admitting: Internal Medicine

## 2016-06-07 NOTE — Telephone Encounter (Signed)
Pts wife called and said that they went to the pharmacy to pick up his refill of dexlansoprazole (DEXILANT) 60mg  and due to insurance coverage it is very expensive and she also said that it is not working as well for him. They wanted to know if he could change him to Protonix instead. Please advise. Thanks [employer]

## 2016-06-08 MED ORDER — PANTOPRAZOLE SODIUM 40 MG PO TBEC: 40.0000 mg | DELAYED_RELEASE_TABLET | Freq: Every day | ORAL | 11 refills | Status: DC

## 2016-06-08 NOTE — Telephone Encounter (Signed)
Routing to dr plotnikov, please advise, I will call patient back, thanks 

## 2016-06-08 NOTE — Telephone Encounter (Signed)
Ok - see Rx Tx

## 2016-06-09 NOTE — Telephone Encounter (Signed)
Advised patient that rx has been sent

## 2016-06-13 ENCOUNTER — Other Ambulatory Visit: Payer: Self-pay | Admitting: Internal Medicine

## 2016-06-17 ENCOUNTER — Ambulatory Visit (INDEPENDENT_AMBULATORY_CARE_PROVIDER_SITE_OTHER): Payer: 59 | Admitting: Internal Medicine

## 2016-06-17 ENCOUNTER — Encounter: Payer: Self-pay | Admitting: Internal Medicine

## 2016-06-17 ENCOUNTER — Other Ambulatory Visit (INDEPENDENT_AMBULATORY_CARE_PROVIDER_SITE_OTHER): Payer: 59

## 2016-06-17 ENCOUNTER — Telehealth: Payer: Self-pay | Admitting: *Deleted

## 2016-06-17 DIAGNOSIS — E1159 Type 2 diabetes mellitus with other circulatory complications: Secondary | ICD-10-CM

## 2016-06-17 DIAGNOSIS — R350 Frequency of micturition: Secondary | ICD-10-CM | POA: Diagnosis not present

## 2016-06-17 DIAGNOSIS — E1165 Type 2 diabetes mellitus with hyperglycemia: Secondary | ICD-10-CM

## 2016-06-17 DIAGNOSIS — I10 Essential (primary) hypertension: Secondary | ICD-10-CM

## 2016-06-17 DIAGNOSIS — IMO0002 Reserved for concepts with insufficient information to code with codable children: Secondary | ICD-10-CM

## 2016-06-17 DIAGNOSIS — E669 Obesity, unspecified: Secondary | ICD-10-CM | POA: Insufficient documentation

## 2016-06-17 LAB — BASIC METABOLIC PANEL
BUN: 29 mg/dL — AB (ref 6–23)
CHLORIDE: 104 meq/L (ref 96–112)
CO2: 25 mEq/L (ref 19–32)
Calcium: 9.8 mg/dL (ref 8.4–10.5)
Creatinine, Ser: 1.46 mg/dL (ref 0.40–1.50)
GFR: 64.82 mL/min (ref >60.00)
Glucose, Bld: 149 mg/dL — ABNORMAL HIGH (ref 70–99)
POTASSIUM: 4 meq/L (ref 3.5–5.1)
SODIUM: 135 meq/L (ref 135–145)

## 2016-06-17 LAB — HEMOGLOBIN A1C: HEMOGLOBIN A1C: 6.8 % — AB (ref 4.6–6.5)

## 2016-06-17 MED ORDER — ALFUZOSIN HCL ER 10 MG PO TB24: 10.0000 mg | ORAL_TABLET | Freq: Every day | ORAL | 3 refills | Status: DC

## 2016-06-17 MED ORDER — IBANDRONATE SODIUM 150 MG PO TABS: 150.0000 mg | ORAL_TABLET | ORAL | 3 refills | Status: DC

## 2016-06-17 MED ORDER — LORAZEPAM 1 MG PO TABS
ORAL_TABLET | ORAL | 0 refills | Status: DC
Start: 2016-06-17 — End: 2017-10-06

## 2016-06-17 MED ORDER — RISEDRONATE SODIUM 30 MG PO TABS: 30.0000 mg | ORAL_TABLET | ORAL | 11 refills | Status: DC

## 2016-06-17 MED ORDER — HYDRALAZINE HCL 50 MG PO TABS: 50.0000 mg | ORAL_TABLET | Freq: Three times a day (TID) | ORAL | 6 refills | Status: DC | PRN

## 2016-06-17 MED ORDER — CARVEDILOL 25 MG PO TABS: 25.0000 mg | ORAL_TABLET | Freq: Two times a day (BID) | ORAL | 3 refills | Status: DC

## 2016-06-17 MED ORDER — SPIRONOLACTONE 25 MG PO TABS: 25.0000 mg | ORAL_TABLET | Freq: Every day | ORAL | 3 refills | Status: DC

## 2016-06-17 MED ORDER — FREESTYLE LANCETS MISC: 12 refills | Status: DC

## 2016-06-17 MED ORDER — FREESTYLE LITE DEVI: 1 refills | Status: DC

## 2016-06-17 MED ORDER — GLUCOSE BLOOD VI STRP: ORAL_STRIP | 12 refills | Status: DC

## 2016-06-17 MED ORDER — AMLODIPINE BESYLATE 10 MG PO TABS: 10.0000 mg | ORAL_TABLET | Freq: Every day | ORAL | 3 refills | Status: DC

## 2016-06-17 MED ORDER — SITAGLIP PHOS-METFORMIN HCL ER 100-1000 MG PO TB24: 1.0000 | ORAL_TABLET | Freq: Every morning | ORAL | 3 refills | Status: DC

## 2016-06-17 MED ORDER — PIOGLITAZONE HCL 30 MG PO TABS: 30.0000 mg | ORAL_TABLET | Freq: Every day | ORAL | 3 refills | Status: DC

## 2016-06-17 MED ORDER — CHLORTHALIDONE 25 MG PO TABS: 25.0000 mg | ORAL_TABLET | Freq: Every day | ORAL | 3 refills | Status: DC

## 2016-06-17 NOTE — Assessment & Plan Note (Signed)
Wt Readings from Last 3 Encounters:  06/17/16 (!) 322 lb 4 oz (146.2 kg)  02/08/16 (!) 321 lb (145.6 kg)  10/08/15 (!) 324 lb (147 kg)  Bariatric clinic discussed

## 2016-06-17 NOTE — Assessment & Plan Note (Signed)
Treat DM Uraxatral

## 2016-06-17 NOTE — Telephone Encounter (Signed)
Rx printed re-faxed electronically back to pharmacy...Nicholas Carr

## 2016-06-17 NOTE — Telephone Encounter (Signed)
Rec'd fax from pharmacy stating insurance plan does not cover pt risedronate 30 mg. The alternatives are Alentronate and Ibandronate. pls advise on change or if you want to proceed for PA...Nicholas Carr

## 2016-06-17 NOTE — Progress Notes (Signed)
Subjective:  Patient ID: Nicholas Carr, male    DOB: 12-Mar-1961  Age: 56 y.o. MRN: [id number]  CC: Hypertension (stable ) and Diabetes (type 2 , stable )   HPI ISAI GOTTLIEB presents for a HTN, anxiety, ED f/u C/o nocturia CBGs 130-145  Outpatient Medications Prior to Visit  Medication Sig Dispense Refill  . amLODipine (NORVASC) 10 MG tablet Take 1 tablet (10 mg total) by mouth daily. 90 tablet 3  . aspirin 81 MG tablet Take 162 mg by mouth daily.    . candesartan (ATACAND) 32 MG tablet TAKE 1 TABLET BY MOUTH DAILY. 90 tablet 1  . carvedilol (COREG) 25 MG tablet Take 1 tablet (25 mg total) by mouth 2 (two) times daily with a meal. 180 tablet 3  . chlorthalidone (HYGROTON) 25 MG tablet Take 1 tablet (25 mg total) by mouth daily. 90 tablet 3  . cholecalciferol (VITAMIN D) 1000 UNITS tablet Take 1,000 Units by mouth daily.    Marland Kitchen glucose blood (ONETOUCH VERIO) test strip Use to check blood sugars twice a day Dx E11.9 100 each 5  . hydrALAZINE (APRESOLINE) 50 MG tablet Take 1 tablet (50 mg total) by mouth 3 (three) times daily as needed (for systolic BP >086). 270 tablet 6  . LORazepam (ATIVAN) 1 MG tablet TAKE 1 TO 2 TABLETS BY MOUTH TWICE A DAY AS NEEDED 60 tablet 0  . lovastatin (MEVACOR) 40 MG tablet TAKE 1 TABLET BY MOUTH AT BEDTIME. 90 tablet 1  . ONETOUCH DELICA LANCETS FINE MISC Use to check blood sugar daily 100 each 3  . pantoprazole (PROTONIX) 40 MG tablet Take 1 tablet (40 mg total) by mouth daily. 30 tablet 11  . pioglitazone (ACTOS) 30 MG tablet Take 1 tablet (30 mg total) by mouth daily. 90 tablet 3  . risedronate (ACTONEL) 30 MG tablet Take 1 tablet (30 mg total) by mouth every 7 (seven) days. with water on empty stomach, nothing by mouth or lie down for next 30 minutes. 30 tablet 11  . SitaGLIPtin-MetFORMIN HCl (JANUMET XR) [phone] MG TB24 Take 1 tablet by mouth every morning. 90 tablet 3  . spironolactone (ALDACTONE) 25 MG tablet Take 1 tablet (25 mg total) by mouth daily.  90 tablet 3  . tadalafil (CIALIS) 20 MG tablet Take 1 tablet (20 mg total) by mouth daily as needed for erectile dysfunction. 12 tablet 0   No facility-administered medications prior to visit.     ROS Review of Systems  Constitutional: Negative for appetite change, fatigue and unexpected weight change.  HENT: Negative for congestion, nosebleeds, sneezing, sore throat and trouble swallowing.   Eyes: Negative for itching and visual disturbance.  Respiratory: Negative for cough.   Cardiovascular: Negative for chest pain, palpitations and leg swelling.  Gastrointestinal: Negative for abdominal distention, blood in stool, diarrhea and nausea.  Genitourinary: Positive for frequency. Negative for hematuria.  Musculoskeletal: Negative for back pain, gait problem, joint swelling and neck pain.  Skin: Negative for rash.  Neurological: Negative for dizziness, tremors, speech difficulty and weakness.  Psychiatric/Behavioral: Negative for agitation, dysphoric mood and sleep disturbance. The patient is not nervous/anxious.     Objective:  BP 112/68   Pulse 79   Temp 97.7 F (36.5 C) (Oral)   Resp 16   Ht 6\' 3"  (1.905 m)   Wt (!) 322 lb 4 oz (146.2 kg)   SpO2 97%   BMI 40.28 kg/m   BP Readings from Last 3 Encounters:  06/17/16 112/68  02/08/16 122/78  10/08/15 130/84    Wt Readings from Last 3 Encounters:  06/17/16 (!) 322 lb 4 oz (146.2 kg)  02/08/16 (!) 321 lb (145.6 kg)  10/08/15 (!) 324 lb (147 kg)    Physical Exam  Constitutional: He is oriented to person, place, and time. He appears well-developed. No distress.  NAD  HENT:  Mouth/Throat: Oropharynx is clear and moist.  Eyes: Conjunctivae are normal. Pupils are equal, round, and reactive to light.  Neck: Normal range of motion. No JVD present. No thyromegaly present.  Cardiovascular: Normal rate, regular rhythm, normal heart sounds and intact distal pulses.  Exam reveals no gallop and no friction rub.   No murmur  heard. Pulmonary/Chest: Effort normal and breath sounds normal. No respiratory distress. He has no wheezes. He has no rales. He exhibits no tenderness.  Abdominal: Soft. Bowel sounds are normal. He exhibits no distension and no mass. There is no tenderness. There is no rebound and no guarding.  Musculoskeletal: Normal range of motion. He exhibits no edema or tenderness.  Lymphadenopathy:    He has no cervical adenopathy.  Neurological: He is alert and oriented to person, place, and time. He has normal reflexes. No cranial nerve deficit. He exhibits normal muscle tone. He displays a negative Romberg sign. Coordination and gait normal.  Skin: Skin is warm and dry. No rash noted.  Psychiatric: He has a normal mood and affect. His behavior is normal. Judgment and thought content normal.  Obese  Lab Results  Component Value Date   WBC 7.1 10/08/2015   HGB 13.1 10/08/2015   HCT 40.0 10/08/2015   PLT 280.0 10/08/2015   GLUCOSE 141 (H) 02/08/2016   CHOL 138 10/08/2015   TRIG 367.0 (H) 10/08/2015   HDL 32.00 (L) 10/08/2015   LDLDIRECT 49.0 10/08/2015   LDLCALC 76 12/20/2013   ALT 28 10/08/2015   AST 20 10/08/2015   NA 137 02/08/2016   K 4.0 02/08/2016   CL 101 02/08/2016   CREATININE 1.46 02/08/2016   BUN 27 (H) 02/08/2016   CO2 27 02/08/2016   TSH 4.28 10/08/2015   PSA 3.30 10/08/2015   HGBA1C 6.6 (H) 02/08/2016   MICROALBUR 2.1 (H) 10/08/2015    No results found.  Assessment & Plan:   There are no diagnoses linked to this encounter. I have discontinued Mr. Witucki tadalafil. I am also having him maintain his cholecalciferol, aspirin, amLODipine, chlorthalidone, spironolactone, carvedilol, hydrALAZINE, SitaGLIPtin-MetFORMIN HCl, LORazepam, ONETOUCH DELICA LANCETS FINE, risedronate, pioglitazone, candesartan, lovastatin, pantoprazole, and glucose blood.  No orders of the defined types were placed in this encounter.    Follow-up: No Follow-up on file.  Walker Kehr, MD

## 2016-06-17 NOTE — Assessment & Plan Note (Signed)
Cont w/ Janumet, ASA, Lovastatin, Actos

## 2016-06-17 NOTE — Telephone Encounter (Signed)
Ok Boniva Thx

## 2016-06-17 NOTE — Assessment & Plan Note (Signed)
Atacand, HCTZ, Verapamil, ASA, spironolactone 

## 2016-06-17 NOTE — Progress Notes (Signed)
Pre-visit discussion using our clinic review tool. No additional management support is needed unless otherwise documented below in the visit note.  

## 2016-06-17 NOTE — Patient Instructions (Signed)
Dr Caren Beasley 

## 2016-09-14 ENCOUNTER — Other Ambulatory Visit: Payer: Self-pay | Admitting: Internal Medicine

## 2016-09-22 ENCOUNTER — Ambulatory Visit (INDEPENDENT_AMBULATORY_CARE_PROVIDER_SITE_OTHER): Payer: 59 | Admitting: Internal Medicine

## 2016-09-22 ENCOUNTER — Other Ambulatory Visit (INDEPENDENT_AMBULATORY_CARE_PROVIDER_SITE_OTHER): Payer: 59

## 2016-09-22 ENCOUNTER — Encounter: Payer: Self-pay | Admitting: Internal Medicine

## 2016-09-22 DIAGNOSIS — H524 Presbyopia: Secondary | ICD-10-CM | POA: Diagnosis not present

## 2016-09-22 DIAGNOSIS — E119 Type 2 diabetes mellitus without complications: Secondary | ICD-10-CM | POA: Diagnosis not present

## 2016-09-22 DIAGNOSIS — H5213 Myopia, bilateral: Secondary | ICD-10-CM | POA: Diagnosis not present

## 2016-09-22 DIAGNOSIS — E1165 Type 2 diabetes mellitus with hyperglycemia: Secondary | ICD-10-CM

## 2016-09-22 DIAGNOSIS — F5102 Adjustment insomnia: Secondary | ICD-10-CM

## 2016-09-22 DIAGNOSIS — K219 Gastro-esophageal reflux disease without esophagitis: Secondary | ICD-10-CM

## 2016-09-22 DIAGNOSIS — I10 Essential (primary) hypertension: Secondary | ICD-10-CM | POA: Diagnosis not present

## 2016-09-22 DIAGNOSIS — E1159 Type 2 diabetes mellitus with other circulatory complications: Secondary | ICD-10-CM | POA: Diagnosis not present

## 2016-09-22 DIAGNOSIS — H52223 Regular astigmatism, bilateral: Secondary | ICD-10-CM | POA: Diagnosis not present

## 2016-09-22 DIAGNOSIS — H11153 Pinguecula, bilateral: Secondary | ICD-10-CM | POA: Diagnosis not present

## 2016-09-22 DIAGNOSIS — IMO0002 Reserved for concepts with insufficient information to code with codable children: Secondary | ICD-10-CM

## 2016-09-22 LAB — BASIC METABOLIC PANEL
BUN: 36 mg/dL — ABNORMAL HIGH (ref 6–23)
CALCIUM: 10.1 mg/dL (ref 8.4–10.5)
CO2: 24 meq/L (ref 19–32)
CREATININE: 1.55 mg/dL — AB (ref 0.40–1.50)
Chloride: 104 mEq/L (ref 96–112)
GFR: 60.43 mL/min (ref >60.00)
Glucose, Bld: 153 mg/dL — ABNORMAL HIGH (ref 70–99)
Potassium: 4.2 mEq/L (ref 3.5–5.1)
Sodium: 136 mEq/L (ref 135–145)

## 2016-09-22 LAB — HEMOGLOBIN A1C: Hgb A1c MFr Bld: 6.5 % (ref 4.6–6.5)

## 2016-09-22 MED ORDER — TADALAFIL 20 MG PO TABS: 20.0000 mg | ORAL_TABLET | Freq: Every day | ORAL | 5 refills | Status: DC | PRN

## 2016-09-22 NOTE — Assessment & Plan Note (Signed)
On Janumet, ASA, Lovastatin, Actos 

## 2016-09-22 NOTE — Assessment & Plan Note (Signed)
Better - loosing wt

## 2016-09-22 NOTE — Assessment & Plan Note (Signed)
Doing well 

## 2016-09-22 NOTE — Progress Notes (Signed)
Subjective:  Patient ID: Nicholas Carr, male    DOB: 08-31-1960  Age: 56 y.o. MRN: [id number]  CC: No chief complaint on file.   HPI KANAN SOBEK presents for HTN, DM, ED f/u  Outpatient Medications Prior to Visit  Medication Sig Dispense Refill  . alfuzosin (UROXATRAL) 10 MG 24 hr tablet Take 1 tablet (10 mg total) by mouth daily. 90 tablet 3  . amLODipine (NORVASC) 10 MG tablet Take 1 tablet (10 mg total) by mouth daily. 90 tablet 3  . aspirin 81 MG tablet Take 162 mg by mouth daily.    . Blood Glucose Monitoring Suppl (FREESTYLE LITE) DEVI As directed 1 each 1  . candesartan (ATACAND) 32 MG tablet TAKE 1 TABLET BY MOUTH DAILY. 90 tablet 1  . carvedilol (COREG) 25 MG tablet Take 1 tablet (25 mg total) by mouth 2 (two) times daily with a meal. 180 tablet 3  . chlorthalidone (HYGROTON) 25 MG tablet Take 1 tablet (25 mg total) by mouth daily. 90 tablet 3  . glucose blood (FREESTYLE LITE) test strip Use to check blood sugars twice a day. Annual appt is due in July must see MD for refills 100 each 0  . hydrALAZINE (APRESOLINE) 50 MG tablet Take 1 tablet (50 mg total) by mouth 3 (three) times daily as needed (for systolic BP >378). 270 tablet 6  . ibandronate (BONIVA) 150 MG tablet Take 1 tablet (150 mg total) by mouth every 30 (thirty) days. Take in the morning with a full glass of water, on an empty stomach, and do not take anything else by mouth or lie down for the next 30 min. 3 tablet 3  . Lancets (FREESTYLE) lancets Use as instructed 100 each 12  . LORazepam (ATIVAN) 1 MG tablet TAKE 1 TO 2 TABLETS BY MOUTH TWICE A DAY AS NEEDED 60 tablet 0  . lovastatin (MEVACOR) 40 MG tablet TAKE 1 TABLET BY MOUTH AT BEDTIME. 90 tablet 1  . pantoprazole (PROTONIX) 40 MG tablet Take 1 tablet (40 mg total) by mouth daily. 30 tablet 11  . pioglitazone (ACTOS) 30 MG tablet Take 1 tablet (30 mg total) by mouth daily. 90 tablet 3  . SitaGLIPtin-MetFORMIN HCl (JANUMET XR) [phone] MG TB24 Take 1 tablet by  mouth every morning. 90 tablet 3  . spironolactone (ALDACTONE) 25 MG tablet Take 1 tablet (25 mg total) by mouth daily. 90 tablet 3  . cholecalciferol (VITAMIN D) 1000 UNITS tablet Take 1,000 Units by mouth daily.     No facility-administered medications prior to visit.     ROS Review of Systems  Constitutional: Negative for appetite change, fatigue and unexpected weight change.  HENT: Negative for congestion, nosebleeds, sneezing, sore throat and trouble swallowing.   Eyes: Negative for itching and visual disturbance.  Respiratory: Negative for cough.   Cardiovascular: Negative for chest pain, palpitations and leg swelling.  Gastrointestinal: Negative for abdominal distention, blood in stool, diarrhea and nausea.  Genitourinary: Negative for frequency and hematuria.  Musculoskeletal: Negative for back pain, gait problem, joint swelling and neck pain.  Skin: Negative for rash.  Neurological: Negative for dizziness, tremors, speech difficulty and weakness.  Psychiatric/Behavioral: Negative for agitation, dysphoric mood, sleep disturbance and suicidal ideas. The patient is not nervous/anxious.     Objective:  BP 114/76 (BP Location: Left Arm, Patient Position: Sitting, Cuff Size: Large)   Pulse 84   Temp 97.8 F (36.6 C) (Oral)   Ht 6\' 3"  (1.905 m)   Wt Marland Kitchen)  314 lb (142.4 kg)   SpO2 99%   BMI 39.25 kg/m   BP Readings from Last 3 Encounters:  09/22/16 114/76  06/17/16 112/68  02/08/16 122/78    Wt Readings from Last 3 Encounters:  09/22/16 (!) 314 lb (142.4 kg)  06/17/16 (!) 322 lb 4 oz (146.2 kg)  02/08/16 (!) 321 lb (145.6 kg)    Physical Exam  Constitutional: He is oriented to person, place, and time. He appears well-developed. No distress.  NAD  HENT:  Mouth/Throat: Oropharynx is clear and moist.  Eyes: Conjunctivae are normal. Pupils are equal, round, and reactive to light.  Neck: Normal range of motion. No JVD present. No thyromegaly present.  Cardiovascular:  Normal rate, regular rhythm, normal heart sounds and intact distal pulses.  Exam reveals no gallop and no friction rub.   No murmur heard. Pulmonary/Chest: Effort normal and breath sounds normal. No respiratory distress. He has no wheezes. He has no rales. He exhibits no tenderness.  Abdominal: Soft. Bowel sounds are normal. He exhibits no distension and no mass. There is no tenderness. There is no rebound and no guarding.  Musculoskeletal: Normal range of motion. He exhibits no edema or tenderness.  Lymphadenopathy:    He has no cervical adenopathy.  Neurological: He is alert and oriented to person, place, and time. He has normal reflexes. No cranial nerve deficit. He exhibits normal muscle tone. He displays a negative Romberg sign. Coordination and gait normal.  Skin: Skin is warm and dry. No rash noted.  Psychiatric: He has a normal mood and affect. His behavior is normal. Judgment and thought content normal.  dry skin on feet  Lab Results  Component Value Date   WBC 7.1 10/08/2015   HGB 13.1 10/08/2015   HCT 40.0 10/08/2015   PLT 280.0 10/08/2015   GLUCOSE 149 (H) 06/17/2016   CHOL 138 10/08/2015   TRIG 367.0 (H) 10/08/2015   HDL 32.00 (L) 10/08/2015   LDLDIRECT 49.0 10/08/2015   LDLCALC 76 12/20/2013   ALT 28 10/08/2015   AST 20 10/08/2015   NA 135 06/17/2016   K 4.0 06/17/2016   CL 104 06/17/2016   CREATININE 1.46 06/17/2016   BUN 29 (H) 06/17/2016   CO2 25 06/17/2016   TSH 4.28 10/08/2015   PSA 3.30 10/08/2015   HGBA1C 6.8 (H) 06/17/2016   MICROALBUR 2.1 (H) 10/08/2015    No results found.  Assessment & Plan:   There are no diagnoses linked to this encounter. I am having Mr. Ballinas maintain his cholecalciferol, aspirin, candesartan, lovastatin, pantoprazole, freestyle, FREESTYLE LITE, alfuzosin, amLODipine, carvedilol, chlorthalidone, hydrALAZINE, LORazepam, pioglitazone, SitaGLIPtin-MetFORMIN HCl, spironolactone, ibandronate, and glucose blood.  No orders of the  defined types were placed in this encounter.    Follow-up: No Follow-up on file.  Walker Kehr, MD

## 2016-09-22 NOTE — Assessment & Plan Note (Signed)
On Protonix 

## 2016-09-22 NOTE — Patient Instructions (Signed)
Try GLYTONE exfoliating body lotion by Desmond Dike (free acid value 17.5) for rough skin patches

## 2016-09-22 NOTE — Assessment & Plan Note (Signed)
Lorazepam prn 

## 2016-09-22 NOTE — Assessment & Plan Note (Signed)
Atacand, HCTZ, Verapamil, ASA, spironolactone Pt lost wt

## 2016-09-26 ENCOUNTER — Telehealth: Payer: Self-pay | Admitting: Internal Medicine

## 2016-09-26 NOTE — Telephone Encounter (Signed)
Patient & wife called about the appointment they he had last week 6/28. 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 Patient would also like his labs results. Please advise or follow up with patient. Thank you.

## 2016-09-27 NOTE — Telephone Encounter (Signed)
LM notifying pt letter ready to be picked up

## 2016-09-27 NOTE — Telephone Encounter (Signed)
letter

## 2016-09-30 ENCOUNTER — Telehealth: Payer: Self-pay | Admitting: Internal Medicine

## 2016-09-30 NOTE — Telephone Encounter (Signed)
Please advise, you prescribed Cialis 09/22/16

## 2016-09-30 NOTE — Telephone Encounter (Signed)
Pt would like to be put on Viagra

## 2016-10-03 MED ORDER — SILDENAFIL CITRATE 100 MG PO TABS: 100.0000 mg | ORAL_TABLET | ORAL | 5 refills | Status: DC | PRN

## 2016-10-03 NOTE — Telephone Encounter (Signed)
LM notifying pt

## 2016-10-03 NOTE — Telephone Encounter (Signed)
OK Viagra

## 2016-11-29 ENCOUNTER — Other Ambulatory Visit: Payer: Self-pay | Admitting: Internal Medicine

## 2016-12-05 ENCOUNTER — Other Ambulatory Visit: Payer: Self-pay | Admitting: Internal Medicine

## 2016-12-08 ENCOUNTER — Telehealth: Payer: Self-pay | Admitting: Internal Medicine

## 2016-12-08 NOTE — Telephone Encounter (Signed)
Spouse called stating that Janumet XR is not covered by [employer].  New scripts will need to go to [facility].  States the script has to be broken down in order for coverage.  Is requesting this to be changed.

## 2016-12-09 NOTE — Telephone Encounter (Signed)
What is covered? Thx

## 2016-12-12 MED ORDER — METFORMIN HCL ER (MOD) 1000 MG PO TB24: 1000.0000 mg | ORAL_TABLET | Freq: Every day | ORAL | 3 refills | Status: DC

## 2016-12-12 MED ORDER — METFORMIN HCL ER 500 MG PO TB24: 1000.0000 mg | ORAL_TABLET | Freq: Every day | ORAL | 3 refills | Status: DC

## 2016-12-12 MED ORDER — SITAGLIPTIN PHOSPHATE 100 MG PO TABS: 100.0000 mg | ORAL_TABLET | Freq: Every day | ORAL | 3 refills | Status: DC

## 2016-12-12 NOTE — Telephone Encounter (Signed)
Pharmacy called requesting clarification on metformin.  Can be reached back at [phone].

## 2016-12-12 NOTE — Telephone Encounter (Signed)
You will have top separate the two meds... Rx for metformin and januvia...Nicholas Carr

## 2016-12-12 NOTE — Telephone Encounter (Signed)
Updated correct metformin (Glumetza) is not covered on plan. Change to Glucophage 1000 mg daily...Johny Chess

## 2016-12-12 NOTE — Telephone Encounter (Signed)
Done. Thx.

## 2016-12-19 ENCOUNTER — Other Ambulatory Visit: Payer: Self-pay | Admitting: Internal Medicine

## 2017-01-20 ENCOUNTER — Ambulatory Visit (INDEPENDENT_AMBULATORY_CARE_PROVIDER_SITE_OTHER): Payer: 59 | Admitting: Internal Medicine

## 2017-01-20 ENCOUNTER — Other Ambulatory Visit (INDEPENDENT_AMBULATORY_CARE_PROVIDER_SITE_OTHER): Payer: 59

## 2017-01-20 ENCOUNTER — Encounter: Payer: Self-pay | Admitting: Internal Medicine

## 2017-01-20 VITALS — BP 112/70 | HR 82 | Temp 98.1°F | Ht 75.0 in | Wt 314.0 lb

## 2017-01-20 DIAGNOSIS — E1165 Type 2 diabetes mellitus with hyperglycemia: Secondary | ICD-10-CM | POA: Diagnosis not present

## 2017-01-20 DIAGNOSIS — F329 Major depressive disorder, single episode, unspecified: Secondary | ICD-10-CM

## 2017-01-20 DIAGNOSIS — E559 Vitamin D deficiency, unspecified: Secondary | ICD-10-CM | POA: Diagnosis not present

## 2017-01-20 DIAGNOSIS — I10 Essential (primary) hypertension: Secondary | ICD-10-CM | POA: Diagnosis not present

## 2017-01-20 DIAGNOSIS — E782 Mixed hyperlipidemia: Secondary | ICD-10-CM | POA: Diagnosis not present

## 2017-01-20 DIAGNOSIS — Z23 Encounter for immunization: Secondary | ICD-10-CM

## 2017-01-20 DIAGNOSIS — IMO0002 Reserved for concepts with insufficient information to code with codable children: Secondary | ICD-10-CM

## 2017-01-20 LAB — BASIC METABOLIC PANEL
BUN: 27 mg/dL — AB (ref 6–23)
CALCIUM: 9.6 mg/dL (ref 8.4–10.5)
CO2: 25 mEq/L (ref 19–32)
CREATININE: 1.39 mg/dL (ref 0.40–1.50)
Chloride: 104 mEq/L (ref 96–112)
GFR: 68.45 mL/min (ref >60.00)
GLUCOSE: 144 mg/dL — AB (ref 70–99)
POTASSIUM: 4.2 meq/L (ref 3.5–5.1)
Sodium: 138 mEq/L (ref 135–145)

## 2017-01-20 LAB — HEMOGLOBIN A1C: HEMOGLOBIN A1C: 6.5 % (ref 4.6–6.5)

## 2017-01-20 NOTE — Assessment & Plan Note (Signed)
On Vit D 

## 2017-01-20 NOTE — Assessment & Plan Note (Signed)
Doing well 

## 2017-01-20 NOTE — Assessment & Plan Note (Signed)
Atacand, HCTZ, Verapamil, ASA, spironolactone

## 2017-01-20 NOTE — Addendum Note (Signed)
Addended by: Karren Cobble on: 01/20/2017 08:18 AM   Modules accepted: Orders

## 2017-01-20 NOTE — Progress Notes (Signed)
Subjective:  Patient ID: Nicholas Carr, male    DOB: November 05, 1960  Age: 56 y.o. MRN: [id number]  CC: No chief complaint on file.   HPI Nicholas Carr presents for DM, HTN, GERD f/u  Outpatient Medications Prior to Visit  Medication Sig Dispense Refill  . alfuzosin (UROXATRAL) 10 MG 24 hr tablet Take 1 tablet (10 mg total) by mouth daily. 90 tablet 3  . amLODipine (NORVASC) 10 MG tablet Take 1 tablet (10 mg total) by mouth daily. 90 tablet 3  . aspirin 81 MG tablet Take 162 mg by mouth daily.    . Blood Glucose Monitoring Suppl (FREESTYLE LITE) DEVI As directed 1 each 1  . candesartan (ATACAND) 32 MG tablet TAKE 1 TABLET BY MOUTH DAILY. 90 tablet 1  . carvedilol (COREG) 25 MG tablet Take 1 tablet (25 mg total) by mouth 2 (two) times daily with a meal. 180 tablet 3  . chlorthalidone (HYGROTON) 25 MG tablet Take 1 tablet (25 mg total) by mouth daily. 90 tablet 3  . glucose blood (FREESTYLE LITE) test strip Use to check blood sugars twice a day. 100 each 5  . hydrALAZINE (APRESOLINE) 50 MG tablet Take 1 tablet (50 mg total) by mouth 3 (three) times daily as needed (for systolic BP >371). 270 tablet 6  . Lancets (FREESTYLE) lancets Use as instructed 100 each 12  . LORazepam (ATIVAN) 1 MG tablet TAKE 1 TO 2 TABLETS BY MOUTH TWICE A DAY AS NEEDED 60 tablet 0  . lovastatin (MEVACOR) 40 MG tablet TAKE 1 TABLET BY MOUTH AT BEDTIME. 90 tablet 1  . metFORMIN (GLUCOPHAGE-XR) 500 MG 24 hr tablet Take 2 tablets (1,000 mg total) by mouth daily with breakfast. 180 tablet 3  . pantoprazole (PROTONIX) 40 MG tablet Take 1 tablet (40 mg total) by mouth daily. 30 tablet 11  . pioglitazone (ACTOS) 30 MG tablet Take 1 tablet (30 mg total) by mouth daily. 90 tablet 3  . sildenafil (VIAGRA) 100 MG tablet Take 1 tablet (100 mg total) by mouth as needed for erectile dysfunction. 12 tablet 5  . sitaGLIPtin (JANUVIA) 100 MG tablet Take 1 tablet (100 mg total) by mouth daily. 90 tablet 3  . spironolactone (ALDACTONE)  25 MG tablet Take 1 tablet (25 mg total) by mouth daily. 90 tablet 3  . cholecalciferol (VITAMIN D) 1000 UNITS tablet Take 1,000 Units by mouth daily.     No facility-administered medications prior to visit.     ROS Review of Systems  Constitutional: Negative for appetite change, fatigue and unexpected weight change.  HENT: Negative for congestion, nosebleeds, sneezing, sore throat and trouble swallowing.   Eyes: Negative for itching and visual disturbance.  Respiratory: Negative for cough.   Cardiovascular: Negative for chest pain, palpitations and leg swelling.  Gastrointestinal: Negative for abdominal distention, blood in stool, diarrhea and nausea.  Genitourinary: Negative for frequency and hematuria.  Musculoskeletal: Negative for back pain, gait problem, joint swelling and neck pain.  Skin: Negative for rash.  Neurological: Negative for dizziness, tremors, speech difficulty and weakness.  Psychiatric/Behavioral: Negative for agitation, dysphoric mood and sleep disturbance. The patient is not nervous/anxious.     Objective:  BP 112/70 (BP Location: Left Arm, Patient Position: Sitting, Cuff Size: Large)   Pulse 82   Temp 98.1 F (36.7 C) (Oral)   Ht 6\' 3"  (1.905 m)   Wt (!) 314 lb (142.4 kg)   SpO2 99%   BMI 39.25 kg/m   BP Readings from Last  3 Encounters:  01/20/17 112/70  09/22/16 114/76  06/17/16 112/68    Wt Readings from Last 3 Encounters:  01/20/17 (!) 314 lb (142.4 kg)  09/22/16 (!) 314 lb (142.4 kg)  06/17/16 (!) 322 lb 4 oz (146.2 kg)    Physical Exam  Constitutional: He is oriented to person, place, and time. He appears well-developed. No distress.  NAD  HENT:  Mouth/Throat: Oropharynx is clear and moist.  Eyes: Pupils are equal, round, and reactive to light. Conjunctivae are normal.  Neck: Normal range of motion. No JVD present. No thyromegaly present.  Cardiovascular: Normal rate, regular rhythm, normal heart sounds and intact distal pulses.  Exam  reveals no gallop and no friction rub.   No murmur heard. Pulmonary/Chest: Effort normal and breath sounds normal. No respiratory distress. He has no wheezes. He has no rales. He exhibits no tenderness.  Abdominal: Soft. Bowel sounds are normal. He exhibits no distension and no mass. There is no tenderness. There is no rebound and no guarding.  Musculoskeletal: Normal range of motion. He exhibits no edema or tenderness.  Lymphadenopathy:    He has no cervical adenopathy.  Neurological: He is alert and oriented to person, place, and time. He has normal reflexes. No cranial nerve deficit. He exhibits normal muscle tone. He displays a negative Romberg sign. Coordination and gait normal.  Skin: Skin is warm and dry. No rash noted.  Psychiatric: He has a normal mood and affect. His behavior is normal. Judgment and thought content normal.  obese  Lab Results  Component Value Date   WBC 7.1 10/08/2015   HGB 13.1 10/08/2015   HCT 40.0 10/08/2015   PLT 280.0 10/08/2015   GLUCOSE 153 (H) 09/22/2016   CHOL 138 10/08/2015   TRIG 367.0 (H) 10/08/2015   HDL 32.00 (L) 10/08/2015   LDLDIRECT 49.0 10/08/2015   LDLCALC 76 12/20/2013   ALT 28 10/08/2015   AST 20 10/08/2015   NA 136 09/22/2016   K 4.2 09/22/2016   CL 104 09/22/2016   CREATININE 1.55 (H) 09/22/2016   BUN 36 (H) 09/22/2016   CO2 24 09/22/2016   TSH 4.28 10/08/2015   PSA 3.30 10/08/2015   HGBA1C 6.5 09/22/2016   MICROALBUR 2.1 (H) 10/08/2015    No results found.  Assessment & Plan:   There are no diagnoses linked to this encounter. I am having Mr. Nixon maintain his cholecalciferol, aspirin, pantoprazole, freestyle, FREESTYLE LITE, alfuzosin, amLODipine, carvedilol, chlorthalidone, hydrALAZINE, LORazepam, pioglitazone, spironolactone, sildenafil, glucose blood, lovastatin, sitaGLIPtin, metFORMIN, and candesartan.  No orders of the defined types were placed in this encounter.    Follow-up: No Follow-up on file.  Walker Kehr, MD

## 2017-01-20 NOTE — Assessment & Plan Note (Signed)
On  ASA, Lovastatin

## 2017-01-20 NOTE — Assessment & Plan Note (Signed)
On Janumet, ASA, Lovastatin, Actos

## 2017-05-23 ENCOUNTER — Other Ambulatory Visit (INDEPENDENT_AMBULATORY_CARE_PROVIDER_SITE_OTHER): Payer: No Typology Code available for payment source

## 2017-05-23 ENCOUNTER — Encounter: Payer: Self-pay | Admitting: Internal Medicine

## 2017-05-23 ENCOUNTER — Ambulatory Visit (INDEPENDENT_AMBULATORY_CARE_PROVIDER_SITE_OTHER): Payer: No Typology Code available for payment source | Admitting: Internal Medicine

## 2017-05-23 VITALS — BP 116/72 | HR 79 | Temp 98.1°F | Ht 75.0 in | Wt 306.0 lb

## 2017-05-23 DIAGNOSIS — I10 Essential (primary) hypertension: Secondary | ICD-10-CM

## 2017-05-23 DIAGNOSIS — E1165 Type 2 diabetes mellitus with hyperglycemia: Secondary | ICD-10-CM

## 2017-05-23 LAB — CBC WITH DIFFERENTIAL/PLATELET
BASOS ABS: 0 10*3/uL (ref 0.0–0.1)
Basophils Relative: 0.5 % (ref 0.0–3.0)
EOS ABS: 0.1 10*3/uL (ref 0.0–0.7)
Eosinophils Relative: 1.7 % (ref 0.0–5.0)
HCT: 39.7 % (ref 39.0–52.0)
Hemoglobin: 13.1 g/dL (ref 13.0–17.0)
LYMPHS ABS: 2.2 10*3/uL (ref 0.7–4.0)
Lymphocytes Relative: 27 % (ref 12.0–46.0)
MCHC: 33.1 g/dL (ref 30.0–36.0)
MCV: 72.3 fl — ABNORMAL LOW (ref 78.0–100.0)
MONO ABS: 0.7 10*3/uL (ref 0.1–1.0)
Monocytes Relative: 8.4 % (ref 3.0–12.0)
NEUTROS PCT: 62.4 % (ref 43.0–77.0)
Neutro Abs: 5.2 10*3/uL (ref 1.4–7.7)
PLATELETS: 327 10*3/uL (ref 150.0–400.0)
RBC: 5.49 Mil/uL (ref 4.22–5.81)
RDW: 14.6 % (ref 11.5–15.5)
WBC: 8.3 10*3/uL (ref 4.0–10.5)

## 2017-05-23 LAB — URINALYSIS
BILIRUBIN URINE: NEGATIVE
HGB URINE DIPSTICK: NEGATIVE
KETONES UR: NEGATIVE
LEUKOCYTES UA: NEGATIVE
Nitrite: NEGATIVE
PH: 6 (ref 5.0–8.0)
Specific Gravity, Urine: 1.025 (ref 1.000–1.030)
Total Protein, Urine: NEGATIVE
UROBILINOGEN UA: 0.2 (ref 0.0–1.0)
Urine Glucose: NEGATIVE

## 2017-05-23 LAB — BASIC METABOLIC PANEL
BUN: 31 mg/dL — ABNORMAL HIGH (ref 6–23)
CHLORIDE: 104 meq/L (ref 96–112)
CO2: 26 meq/L (ref 19–32)
Calcium: 10.5 mg/dL (ref 8.4–10.5)
Creatinine, Ser: 1.57 mg/dL — ABNORMAL HIGH (ref 0.40–1.50)
GFR: 59.4 mL/min — ABNORMAL LOW (ref >60.00)
GLUCOSE: 113 mg/dL — AB (ref 70–99)
Potassium: 4.2 mEq/L (ref 3.5–5.1)
Sodium: 137 mEq/L (ref 135–145)

## 2017-05-23 LAB — LIPID PANEL
CHOL/HDL RATIO: 4
Cholesterol: 149 mg/dL (ref 0–200)
HDL: 35.6 mg/dL — AB (ref >39.00)
LDL CALC: 88 mg/dL (ref 0–99)
NONHDL: 113.06
TRIGLYCERIDES: 125 mg/dL (ref 0.0–149.0)
VLDL: 25 mg/dL (ref 0.0–40.0)

## 2017-05-23 LAB — MICROALBUMIN / CREATININE URINE RATIO
Creatinine,U: 142.6 mg/dL
MICROALB/CREAT RATIO: 0.6 mg/g (ref 0.0–30.0)
Microalb, Ur: 0.9 mg/dL (ref 0.0–1.9)

## 2017-05-23 LAB — HEPATIC FUNCTION PANEL
ALBUMIN: 4.1 g/dL (ref 3.5–5.2)
ALT: 20 U/L (ref 0–53)
AST: 15 U/L (ref 0–37)
Alkaline Phosphatase: 74 U/L (ref 39–117)
BILIRUBIN TOTAL: 0.3 mg/dL (ref 0.2–1.2)
Bilirubin, Direct: 0.1 mg/dL (ref 0.0–0.3)
Total Protein: 8.2 g/dL (ref 6.0–8.3)

## 2017-05-23 LAB — HEMOGLOBIN A1C: HEMOGLOBIN A1C: 6.3 % (ref 4.6–6.5)

## 2017-05-23 LAB — TSH: TSH: 3.61 u[IU]/mL (ref 0.35–4.50)

## 2017-05-23 MED ORDER — CANDESARTAN CILEXETIL-HCTZ 16-12.5 MG PO TABS: 1.0000 | ORAL_TABLET | Freq: Every day | ORAL | 3 refills | Status: DC

## 2017-05-23 NOTE — Progress Notes (Signed)
Subjective:  Patient ID: Nicholas Carr, male    DOB: [birth year]  Age: 57 y.o. MRN: [id number]  CC: No chief complaint on file.   HPI Nicholas Carr presents for low BP: he was dizzy; BP was low. Stopped Norvasc, HCT. Reduced  To 1/2 tab candesartan Lost wt on diet  Outpatient Medications Prior to Visit  Medication Sig Dispense Refill  . alfuzosin (UROXATRAL) 10 MG 24 hr tablet Take 1 tablet (10 mg total) by mouth daily. 90 tablet 3  . amLODipine (NORVASC) 10 MG tablet Take 1 tablet (10 mg total) by mouth daily. 90 tablet 3  . aspirin 81 MG tablet Take 162 mg by mouth daily.    . Blood Glucose Monitoring Suppl (FREESTYLE LITE) DEVI As directed 1 each 1  . candesartan (ATACAND) 32 MG tablet TAKE 1 TABLET BY MOUTH DAILY. 90 tablet 1  . carvedilol (COREG) 25 MG tablet Take 1 tablet (25 mg total) by mouth 2 (two) times daily with a meal. 180 tablet 3  . chlorthalidone (HYGROTON) 25 MG tablet Take 1 tablet (25 mg total) by mouth daily. 90 tablet 3  . glucose blood (FREESTYLE LITE) test strip Use to check blood sugars twice a day. 100 each 5  . hydrALAZINE (APRESOLINE) 50 MG tablet Take 1 tablet (50 mg total) by mouth 3 (three) times daily as needed (for systolic BP >967). 270 tablet 6  . Lancets (FREESTYLE) lancets Use as instructed 100 each 12  . LORazepam (ATIVAN) 1 MG tablet TAKE 1 TO 2 TABLETS BY MOUTH TWICE A DAY AS NEEDED 60 tablet 0  . lovastatin (MEVACOR) 40 MG tablet TAKE 1 TABLET BY MOUTH AT BEDTIME. 90 tablet 1  . metFORMIN (GLUCOPHAGE-XR) 500 MG 24 hr tablet Take 2 tablets (1,000 mg total) by mouth daily with breakfast. 180 tablet 3  . pantoprazole (PROTONIX) 40 MG tablet Take 1 tablet (40 mg total) by mouth daily. 30 tablet 11  . pioglitazone (ACTOS) 30 MG tablet Take 1 tablet (30 mg total) by mouth daily. 90 tablet 3  . sildenafil (VIAGRA) 100 MG tablet Take 1 tablet (100 mg total) by mouth as needed for erectile dysfunction. 12 tablet 5  . sitaGLIPtin (JANUVIA) 100 MG tablet  Take 1 tablet (100 mg total) by mouth daily. 90 tablet 3  . spironolactone (ALDACTONE) 25 MG tablet Take 1 tablet (25 mg total) by mouth daily. 90 tablet 3  . cholecalciferol (VITAMIN D) 1000 UNITS tablet Take 1,000 Units by mouth daily.     No facility-administered medications prior to visit.     ROS Review of Systems  Constitutional: Negative for appetite change, fatigue and unexpected weight change.  HENT: Negative for congestion, nosebleeds, sneezing, sore throat and trouble swallowing.   Eyes: Negative for itching and visual disturbance.  Respiratory: Negative for cough.   Cardiovascular: Negative for chest pain, palpitations and leg swelling.  Gastrointestinal: Negative for abdominal distention, blood in stool, diarrhea and nausea.  Genitourinary: Negative for frequency and hematuria.  Musculoskeletal: Negative for back pain, gait problem, joint swelling and neck pain.  Skin: Negative for rash.  Neurological: Negative for dizziness, tremors, speech difficulty and weakness.  Psychiatric/Behavioral: Negative for agitation, dysphoric mood and sleep disturbance. The patient is not nervous/anxious.     Objective:  BP 116/72 (BP Location: Left Arm, Patient Position: Sitting, Cuff Size: Large)   Pulse 79   Temp 98.1 F (36.7 C) (Oral)   Ht 6\' 3"  (1.905 m)   Wt (!) 306 lb (  138.8 kg)   SpO2 98%   BMI 38.25 kg/m   BP Readings from Last 3 Encounters:  05/23/17 116/72  01/20/17 112/70  09/22/16 114/76    Wt Readings from Last 3 Encounters:  05/23/17 (!) 306 lb (138.8 kg)  01/20/17 (!) 314 lb (142.4 kg)  09/22/16 (!) 314 lb (142.4 kg)    Physical Exam  Constitutional: He is oriented to person, place, and time. He appears well-developed. No distress.  NAD  HENT:  Mouth/Throat: Oropharynx is clear and moist.  Eyes: Conjunctivae are normal. Pupils are equal, round, and reactive to light.  Neck: Normal range of motion. No JVD present. No thyromegaly present.  Cardiovascular:  Normal rate, regular rhythm, normal heart sounds and intact distal pulses. Exam reveals no gallop and no friction rub.  No murmur heard. Pulmonary/Chest: Effort normal and breath sounds normal. No respiratory distress. He has no wheezes. He has no rales. He exhibits no tenderness.  Abdominal: Soft. Bowel sounds are normal. He exhibits no distension and no mass. There is no tenderness. There is no rebound and no guarding.  Musculoskeletal: Normal range of motion. He exhibits no edema or tenderness.  Lymphadenopathy:    He has no cervical adenopathy.  Neurological: He is alert and oriented to person, place, and time. He has normal reflexes. No cranial nerve deficit. He exhibits normal muscle tone. He displays a negative Romberg sign. Coordination and gait normal.  Skin: Skin is warm and dry. No rash noted.  Psychiatric: He has a normal mood and affect. His behavior is normal. Judgment and thought content normal.  obese  Lab Results  Component Value Date   WBC 7.1 10/08/2015   HGB 13.1 10/08/2015   HCT 40.0 10/08/2015   PLT 280.0 10/08/2015   GLUCOSE 144 (H) 01/20/2017   CHOL 138 10/08/2015   TRIG 367.0 (H) 10/08/2015   HDL 32.00 (L) 10/08/2015   LDLDIRECT 49.0 10/08/2015   LDLCALC 76 12/20/2013   ALT 28 10/08/2015   AST 20 10/08/2015   NA 138 01/20/2017   K 4.2 01/20/2017   CL 104 01/20/2017   CREATININE 1.39 01/20/2017   BUN 27 (H) 01/20/2017   CO2 25 01/20/2017   TSH 4.28 10/08/2015   PSA 3.30 10/08/2015   HGBA1C 6.5 01/20/2017   MICROALBUR 2.1 (H) 10/08/2015    No results found.  Assessment & Plan:   There are no diagnoses linked to this encounter. I am having Chrissie Noa L. Go maintain his cholecalciferol, aspirin, pantoprazole, freestyle, FREESTYLE LITE, alfuzosin, amLODipine, carvedilol, chlorthalidone, hydrALAZINE, LORazepam, pioglitazone, spironolactone, sildenafil, glucose blood, lovastatin, sitaGLIPtin, metFORMIN, and candesartan.  No orders of the defined types  were placed in this encounter.    Follow-up: No Follow-up on file.  Walker Kehr, MD

## 2017-05-23 NOTE — Patient Instructions (Signed)
Wt Readings from Last 3 Encounters:  05/23/17 (!) 306 lb (138.8 kg)  01/20/17 (!) 314 lb (142.4 kg)  09/22/16 (!) 314 lb (142.4 kg)    BP Readings from Last 3 Encounters:  05/23/17 116/72  01/20/17 112/70  09/22/16 114/76

## 2017-05-25 ENCOUNTER — Telehealth: Payer: Self-pay | Admitting: Internal Medicine

## 2017-05-25 NOTE — Telephone Encounter (Signed)
Copied from [place]. Topic: Quick Communication - Rx Refill/Question >> May 25, 2017  7:35 AM Scherrie Gerlach wrote: Medication: candesartan-hydrochlorothiazide (ATACAND HCT) 16-12.5 MG tablet wife calling to advise pt had been on a 25 hydrochlorothiazide, and this new med called in had only 12.5 (half) that amount.  Pt has already picked up this med, so is requesting another Rx for Hydrochlorothiazide 12.5 mg tab. 90 days Wife states pt is used to taking 25 mg.  The pharmacist advised pt to call the dr and have another Rx called in for 12.5 .  [place], [place] [phone] (Phone) [phone] (Fax)

## 2017-05-27 NOTE — Telephone Encounter (Signed)
Review of the dose of HCTZ on purpose, because of the slightly elevated creatinine.  Please continue with the current plan.  Thank you

## 2017-05-29 NOTE — Telephone Encounter (Signed)
Notified pt w/Md response.../lmb 

## 2017-06-01 ENCOUNTER — Ambulatory Visit: Payer: Self-pay

## 2017-06-01 NOTE — Telephone Encounter (Signed)
Please advise 

## 2017-06-01 NOTE — Telephone Encounter (Signed)
Pt. Requesting medication be changed due to elevated home readings. BP this morning 140/92. Feels "a little lightheaded".Please advise pt. Answer Assessment - Initial Assessment Questions 1. BLOOD PRESSURE: "What is the blood pressure?" "Did you take at least two measurements 5 minutes apart?"     140/92 2. ONSET: "When did you take your blood pressure?"     This morning 3. HOW: "How did you obtain the blood pressure?" (e.g., visiting nurse, automatic home BP monitor)     Home BP monitor 4. HISTORY: "Do you have a history of high blood pressure?"     Yes 5. MEDICATIONS: "Are you taking any medications for blood pressure?" "Have you missed any doses recently?"     No missed doses 6. OTHER SYMPTOMS: "Do you have any symptoms?" (e.g., headache, chest pain, blurred vision, difficulty breathing, weakness)     Lightheaded 7. PREGNANCY: "Is there any chance you are pregnant?" "When was your last menstrual period?"     No  Protocols used: HIGH BLOOD PRESSURE-A-AH

## 2017-06-01 NOTE — Telephone Encounter (Signed)
Called and talked with Mr Nicholas Carr, patient----patient has been taken off all diuretics due to kidney function---however, patient did not realize he was supposed to be taking carvidelol twice daily with meals, patient is going to take that now and recheck his bp later to see if its better controlled---patient instructed to either call back if not better, or go to ED if he becomes symptomatic and our office has already closed

## 2017-06-02 ENCOUNTER — Encounter: Payer: 59 | Admitting: Internal Medicine

## 2017-06-06 ENCOUNTER — Encounter: Payer: Self-pay | Admitting: *Deleted

## 2017-06-06 ENCOUNTER — Other Ambulatory Visit: Payer: Self-pay | Admitting: Internal Medicine

## 2017-06-06 ENCOUNTER — Ambulatory Visit: Payer: Self-pay | Admitting: *Deleted

## 2017-06-06 NOTE — Telephone Encounter (Signed)
Pt reports headache last night; B/P 146/82, no other symptoms.  States took Aleve, effective. Pt checked B/P during triage call, 157/99, after 5 minutes 148/91. No headache presently. Last took meds at 0630. Seen 05/23/17 by Dr. Alain Marion for "low B/P" (116/72.)  Triaged 06/01/17 "lightheaded, B/P 140/92, however  he was not taking his Carvidelol twice a day at that time. States he is taking all meds as prescribed, has not missed any meds as prescribed. Appt made for Friday with Jodi Mourning. Care advise given per protocol. Instructed to call back for B/P  160/100, severe headache, one sided weakness, difficulty with speech or walking. Reason for Disposition . [5] Systolic BP  >= 956 OR Diastolic >= 90 AND [3] taking BP medications  Answer Assessment - Initial Assessment Questions 1. BLOOD PRESSURE: "What is the blood pressure?" "Did you take at least two measurements 5 minutes apart?"     157/99 ...148/91 after 5 minutes 2. ONSET: "When did you take your blood pressure?"    During triage call 3. HOW: "How did you obtain the blood pressure?" (e.g., visiting nurse, automatic home BP monitor)    Home B/P monitor 4. HISTORY: "Do you have a history of high blood pressure?"     Yes 5. MEDICATIONS: "Are you taking any medications for blood pressure?" "Have you missed any doses recently?"     Yes, no missed dose 6. OTHER SYMPTOMS: "Do you have any symptoms?" (e.g., headache, chest pain, blurred vision, difficulty breathing, weakness)     Headache last night when B/P 146/82, relieved with Aleve, no H/A presently  Protocols used: HIGH BLOOD PRESSURE-A-AH

## 2017-06-06 NOTE — Telephone Encounter (Signed)
This encounter was created in error - please disregard.

## 2017-06-09 ENCOUNTER — Encounter: Payer: Self-pay | Admitting: Family

## 2017-06-09 ENCOUNTER — Other Ambulatory Visit (INDEPENDENT_AMBULATORY_CARE_PROVIDER_SITE_OTHER): Payer: No Typology Code available for payment source

## 2017-06-09 ENCOUNTER — Ambulatory Visit (INDEPENDENT_AMBULATORY_CARE_PROVIDER_SITE_OTHER): Payer: No Typology Code available for payment source | Admitting: Family

## 2017-06-09 VITALS — BP 158/92 | HR 86 | Temp 97.8°F | Ht 75.0 in | Wt 314.0 lb

## 2017-06-09 DIAGNOSIS — I10 Essential (primary) hypertension: Secondary | ICD-10-CM

## 2017-06-09 DIAGNOSIS — R600 Localized edema: Secondary | ICD-10-CM | POA: Diagnosis not present

## 2017-06-09 LAB — COMPREHENSIVE METABOLIC PANEL
ALBUMIN: 4 g/dL (ref 3.5–5.2)
ALK PHOS: 64 U/L (ref 39–117)
ALT: 26 U/L (ref 0–53)
AST: 20 U/L (ref 0–37)
BILIRUBIN TOTAL: 0.3 mg/dL (ref 0.2–1.2)
BUN: 19 mg/dL (ref 6–23)
CALCIUM: 9.6 mg/dL (ref 8.4–10.5)
CO2: 22 mEq/L (ref 19–32)
Chloride: 107 mEq/L (ref 96–112)
Creatinine, Ser: 1.22 mg/dL (ref 0.40–1.50)
GFR: 79.45 mL/min (ref >60.00)
Glucose, Bld: 130 mg/dL — ABNORMAL HIGH (ref 70–99)
Potassium: 4 mEq/L (ref 3.5–5.1)
Sodium: 137 mEq/L (ref 135–145)
TOTAL PROTEIN: 7.5 g/dL (ref 6.0–8.3)

## 2017-06-09 MED ORDER — HYDRALAZINE HCL 50 MG PO TABS: 50.0000 mg | ORAL_TABLET | Freq: Three times a day (TID) | ORAL | 2 refills | Status: DC | PRN

## 2017-06-09 MED ORDER — POTASSIUM CHLORIDE ER 10 MEQ PO TBCR: 10.0000 meq | EXTENDED_RELEASE_TABLET | Freq: Every day | ORAL | 0 refills | Status: DC

## 2017-06-09 MED ORDER — CANDESARTAN CILEXETIL 32 MG PO TABS: 32.0000 mg | ORAL_TABLET | Freq: Every day | ORAL | 1 refills | Status: DC

## 2017-06-09 MED ORDER — FUROSEMIDE 20 MG PO TABS: 20.0000 mg | ORAL_TABLET | Freq: Every day | ORAL | 0 refills | Status: DC

## 2017-06-09 MED ORDER — SPIRONOLACTONE 25 MG PO TABS: 25.0000 mg | ORAL_TABLET | Freq: Every day | ORAL | 0 refills | Status: DC

## 2017-06-09 MED ORDER — CARVEDILOL 25 MG PO TABS: 25.0000 mg | ORAL_TABLET | Freq: Two times a day (BID) | ORAL | 1 refills | Status: DC

## 2017-06-09 MED ORDER — HYDROCHLOROTHIAZIDE 12.5 MG PO CAPS: 12.5000 mg | ORAL_CAPSULE | Freq: Every day | ORAL | 0 refills | Status: DC

## 2017-06-09 NOTE — Progress Notes (Signed)
Reviewed with patient; he expressed understanding and will keep planned follow-up with his PCP for April; he understands to call back if Lasix does not help with swelling over the weekend.
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
## 2017-06-09 NOTE — Patient Instructions (Signed)
Going forward:  Use Lasix for the next 2 days to get fluid off and continue the 3 current medications;  On Monday:  Go back to Spironolactone 25 mg, Candesartan 32 mg, Carvedilol 25 mg twice a day and HCTZ 12.5 mg daily;

## 2017-06-12 ENCOUNTER — Telehealth: Payer: Self-pay | Admitting: Family

## 2017-06-12 NOTE — Telephone Encounter (Signed)
Please call and check on him; how is he feeling? Blood pressure better?

## 2017-06-13 NOTE — Telephone Encounter (Signed)
(  FYI) Spoke with patient today. He still feels like he is adjusting to his meds but overall feels like things are stabilizing. He is checking his BP and he has been told that if it goes back to being high and he it feels like its not being control to contact office. Patient thankful for call.

## 2017-06-20 ENCOUNTER — Other Ambulatory Visit: Payer: Self-pay | Admitting: Internal Medicine

## 2017-07-07 ENCOUNTER — Ambulatory Visit (INDEPENDENT_AMBULATORY_CARE_PROVIDER_SITE_OTHER): Payer: No Typology Code available for payment source | Admitting: Internal Medicine

## 2017-07-07 ENCOUNTER — Encounter: Payer: Self-pay | Admitting: Internal Medicine

## 2017-07-07 DIAGNOSIS — I10 Essential (primary) hypertension: Secondary | ICD-10-CM

## 2017-07-07 DIAGNOSIS — R7989 Other specified abnormal findings of blood chemistry: Secondary | ICD-10-CM | POA: Diagnosis not present

## 2017-07-07 DIAGNOSIS — E1165 Type 2 diabetes mellitus with hyperglycemia: Secondary | ICD-10-CM | POA: Diagnosis not present

## 2017-07-07 DIAGNOSIS — Z0000 Encounter for general adult medical examination without abnormal findings: Secondary | ICD-10-CM

## 2017-07-07 DIAGNOSIS — E559 Vitamin D deficiency, unspecified: Secondary | ICD-10-CM

## 2017-07-07 NOTE — Assessment & Plan Note (Signed)
Labs in 3 mo

## 2017-07-07 NOTE — Addendum Note (Signed)
Addended by: Karren Cobble on: 07/07/2017 01:03 PM   Modules accepted: Orders

## 2017-07-07 NOTE — Progress Notes (Signed)
Subjective:  Patient ID: Nicholas Carr, male    DOB: October 22, 1960  Age: 57 y.o. MRN: [id number]  CC: No chief complaint on file.   HPI Nicholas Carr presents for a well exam. Lost 10 lbs F/u  HTN, DM  Outpatient Medications Prior to Visit  Medication Sig Dispense Refill  . alfuzosin (UROXATRAL) 10 MG 24 hr tablet TAKE 1 TABLET BY MOUTH DAILY. 90 tablet 3  . aspirin 81 MG tablet Take 162 mg by mouth daily.    . Blood Glucose Monitoring Suppl (FREESTYLE LITE) DEVI As directed 1 each 1  . candesartan (ATACAND) 32 MG tablet Take 1 tablet (32 mg total) by mouth daily. 90 tablet 1  . carvedilol (COREG) 25 MG tablet Take 1 tablet (25 mg total) by mouth 2 (two) times daily with a meal. 180 tablet 1  . chlorthalidone (HYGROTON) 25 MG tablet TAKE 1 TABLET BY MOUTH DAILY. 90 tablet 3  . furosemide (LASIX) 20 MG tablet Take 1 tablet (20 mg total) by mouth daily. As needed for swelling 30 tablet 0  . glucose blood (FREESTYLE LITE) test strip Use to check blood sugars twice a day. 100 each 5  . hydrALAZINE (APRESOLINE) 50 MG tablet Take 1 tablet (50 mg total) by mouth 3 (three) times daily as needed (for systolic BP >268). 270 tablet 2  . hydrochlorothiazide (MICROZIDE) 12.5 MG capsule Take 1 capsule (12.5 mg total) by mouth daily. 90 capsule 0  . Lancets (FREESTYLE) lancets Use as instructed 100 each 12  . LORazepam (ATIVAN) 1 MG tablet TAKE 1 TO 2 TABLETS BY MOUTH TWICE A DAY AS NEEDED 60 tablet 0  . lovastatin (MEVACOR) 40 MG tablet TAKE 1 TABLET BY MOUTH AT BEDTIME 90 tablet 1  . metFORMIN (GLUCOPHAGE-XR) 500 MG 24 hr tablet Take 2 tablets (1,000 mg total) by mouth daily with breakfast. 180 tablet 3  . pantoprazole (PROTONIX) 40 MG tablet TAKE 1 TABLET (40 MG TOTAL) BY MOUTH DAILY. 30 tablet 11  . pioglitazone (ACTOS) 30 MG tablet Take 1 tablet (30 mg total) by mouth daily. 90 tablet 3  . potassium chloride (K-DUR) 10 MEQ tablet Take 1 tablet (10 mEq total) by mouth daily. Use as directed with  Furosemide 30 tablet 0  . sildenafil (VIAGRA) 100 MG tablet Take 1 tablet (100 mg total) by mouth as needed for erectile dysfunction. 12 tablet 5  . sitaGLIPtin (JANUVIA) 100 MG tablet Take 1 tablet (100 mg total) by mouth daily. 90 tablet 3  . spironolactone (ALDACTONE) 25 MG tablet Take 1 tablet (25 mg total) by mouth daily. 90 tablet 0  . cholecalciferol (VITAMIN D) 1000 UNITS tablet Take 1,000 Units by mouth daily.     No facility-administered medications prior to visit.     ROS Review of Systems  Constitutional: Negative for appetite change, fatigue and unexpected weight change.  HENT: Negative for congestion, nosebleeds, sneezing, sore throat and trouble swallowing.   Eyes: Negative for itching and visual disturbance.  Respiratory: Negative for cough.   Cardiovascular: Negative for chest pain, palpitations and leg swelling.  Gastrointestinal: Negative for abdominal distention, blood in stool, diarrhea and nausea.  Genitourinary: Negative for frequency and hematuria.  Musculoskeletal: Negative for back pain, gait problem, joint swelling and neck pain.  Skin: Negative for rash.  Neurological: Negative for dizziness, tremors, speech difficulty and weakness.  Psychiatric/Behavioral: Negative for agitation, dysphoric mood and sleep disturbance. The patient is not nervous/anxious.     Objective:  BP 136/86 (BP  Location: Left Arm, Patient Position: Sitting, Cuff Size: Large)   Pulse 74   Temp 98.2 F (36.8 C) (Oral)   Ht 6\' 3"  (1.905 m)   Wt (!) 304 lb (137.9 kg)   SpO2 99%   BMI 38.00 kg/m   BP Readings from Last 3 Encounters:  07/07/17 136/86  06/09/17 (!) 158/92  05/23/17 116/72    Wt Readings from Last 3 Encounters:  07/07/17 (!) 304 lb (137.9 kg)  06/09/17 (!) 314 lb (142.4 kg)  05/23/17 (!) 306 lb (138.8 kg)    Physical Exam  Constitutional: He is oriented to person, place, and time. He appears well-developed. No distress.  NAD  HENT:  Mouth/Throat: Oropharynx  is clear and moist.  Eyes: Pupils are equal, round, and reactive to light. Conjunctivae are normal.  Neck: Normal range of motion. No JVD present. No thyromegaly present.  Cardiovascular: Normal rate, regular rhythm, normal heart sounds and intact distal pulses. Exam reveals no gallop and no friction rub.  No murmur heard. Pulmonary/Chest: Effort normal and breath sounds normal. No respiratory distress. He has no wheezes. He has no rales. He exhibits no tenderness.  Abdominal: Soft. Bowel sounds are normal. He exhibits no distension and no mass. There is no tenderness. There is no rebound and no guarding.  Genitourinary: Rectum normal. Rectal exam shows guaiac negative stool.  Musculoskeletal: Normal range of motion. He exhibits no edema or tenderness.  Lymphadenopathy:    He has no cervical adenopathy.  Neurological: He is alert and oriented to person, place, and time. He has normal reflexes. No cranial nerve deficit. He exhibits normal muscle tone. He displays a negative Romberg sign. Coordination and gait normal.  Skin: Skin is warm and dry. No rash noted.  Psychiatric: He has a normal mood and affect. His behavior is normal. Judgment and thought content normal.  prostate 1+  Lab Results  Component Value Date   WBC 8.3 05/23/2017   HGB 13.1 05/23/2017   HCT 39.7 05/23/2017   PLT 327.0 05/23/2017   GLUCOSE 130 (H) 06/09/2017   CHOL 149 05/23/2017   TRIG 125.0 05/23/2017   HDL 35.60 (L) 05/23/2017   LDLDIRECT 49.0 10/08/2015   LDLCALC 88 05/23/2017   ALT 26 06/09/2017   AST 20 06/09/2017   NA 137 06/09/2017   K 4.0 06/09/2017   CL 107 06/09/2017   CREATININE 1.22 06/09/2017   BUN 19 06/09/2017   CO2 22 06/09/2017   TSH 3.61 05/23/2017   PSA 3.30 10/08/2015   HGBA1C 6.3 05/23/2017   MICROALBUR 0.9 05/23/2017    No results found.  Assessment & Plan:   There are no diagnoses linked to this encounter. I am having Nicholas Carr maintain his cholecalciferol, aspirin,  freestyle, FREESTYLE LITE, LORazepam, pioglitazone, sildenafil, glucose blood, sitaGLIPtin, metFORMIN, alfuzosin, lovastatin, pantoprazole, hydrALAZINE, furosemide, potassium chloride, hydrochlorothiazide, carvedilol, candesartan, spironolactone, and chlorthalidone.  No orders of the defined types were placed in this encounter.    Follow-up: No follow-ups on file.  Walker Kehr, MD

## 2017-07-07 NOTE — Assessment & Plan Note (Signed)
Atacand, HCTZ, Verapamil, ASA, spironolactone, Bystolic Renal US

## 2017-07-07 NOTE — Assessment & Plan Note (Signed)
Wt Readings from Last 3 Encounters:  07/07/17 (!) 304 lb (137.9 kg)  06/09/17 (!) 314 lb (142.4 kg)  05/23/17 (!) 306 lb (138.8 kg)

## 2017-07-07 NOTE — Assessment & Plan Note (Signed)
We discussed age appropriate health related issues, including available/recomended screening tests and vaccinations. We discussed a need for adhering to healthy diet and exercise. Labs were ordered to be later reviewed . All questions were answered.  Added PSA

## 2017-07-07 NOTE — Assessment & Plan Note (Signed)
Vit d 

## 2017-07-07 NOTE — Patient Instructions (Signed)
Shingrix vaccine

## 2017-07-31 ENCOUNTER — Other Ambulatory Visit: Payer: Self-pay | Admitting: Internal Medicine

## 2017-08-10 ENCOUNTER — Telehealth: Payer: Self-pay | Admitting: Internal Medicine

## 2017-08-10 NOTE — Telephone Encounter (Signed)
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 Please and thank you.

## 2017-08-10 NOTE — Telephone Encounter (Signed)
Okay to write

## 2017-08-10 NOTE — Telephone Encounter (Signed)
Ok Thx 

## 2017-08-14 NOTE — Telephone Encounter (Signed)
Letter written and placed up front for pt to pick up, pt notified

## 2017-09-04 ENCOUNTER — Other Ambulatory Visit: Payer: Self-pay | Admitting: Internal Medicine

## 2017-09-11 ENCOUNTER — Other Ambulatory Visit (INDEPENDENT_AMBULATORY_CARE_PROVIDER_SITE_OTHER): Payer: No Typology Code available for payment source

## 2017-09-11 ENCOUNTER — Other Ambulatory Visit: Payer: Self-pay | Admitting: Internal Medicine

## 2017-09-11 DIAGNOSIS — Z0000 Encounter for general adult medical examination without abnormal findings: Secondary | ICD-10-CM

## 2017-09-11 DIAGNOSIS — E1165 Type 2 diabetes mellitus with hyperglycemia: Secondary | ICD-10-CM | POA: Diagnosis not present

## 2017-09-11 DIAGNOSIS — R972 Elevated prostate specific antigen [PSA]: Secondary | ICD-10-CM

## 2017-09-11 DIAGNOSIS — I10 Essential (primary) hypertension: Secondary | ICD-10-CM | POA: Diagnosis not present

## 2017-09-11 LAB — HM DIABETES EYE EXAM

## 2017-09-11 LAB — HEPATIC FUNCTION PANEL
ALT: 24 U/L (ref 0–53)
AST: 21 U/L (ref 0–37)
Albumin: 4.3 g/dL (ref 3.5–5.2)
Alkaline Phosphatase: 74 U/L (ref 39–117)
Bilirubin, Direct: 0.1 mg/dL (ref 0.0–0.3)
Total Bilirubin: 0.3 mg/dL (ref 0.2–1.2)
Total Protein: 7.6 g/dL (ref 6.0–8.3)

## 2017-09-11 LAB — BASIC METABOLIC PANEL
BUN: 43 mg/dL — ABNORMAL HIGH (ref 6–23)
CHLORIDE: 105 meq/L (ref 96–112)
CO2: 23 meq/L (ref 19–32)
Calcium: 10 mg/dL (ref 8.4–10.5)
Creatinine, Ser: 1.81 mg/dL — ABNORMAL HIGH (ref 0.40–1.50)
GFR: 50.35 mL/min — ABNORMAL LOW (ref >60.00)
Glucose, Bld: 131 mg/dL — ABNORMAL HIGH (ref 70–99)
POTASSIUM: 4.6 meq/L (ref 3.5–5.1)
Sodium: 138 mEq/L (ref 135–145)

## 2017-09-11 LAB — PSA: PSA: 6.55 ng/mL — ABNORMAL HIGH (ref 0.10–4.00)

## 2017-09-11 LAB — HEMOGLOBIN A1C: Hgb A1c MFr Bld: 6.5 % (ref 4.6–6.5)

## 2017-09-13 ENCOUNTER — Telehealth: Payer: Self-pay

## 2017-09-13 DIAGNOSIS — R972 Elevated prostate specific antigen [PSA]: Secondary | ICD-10-CM

## 2017-09-13 NOTE — Telephone Encounter (Signed)
Because of pts PSA  Copied from [place] [id number]. Topic: Referral - Request >> Sep 12, 2017  5:52 PM Nicholas Carr B wrote: Reason for CRM: pt's wife called and asked for a emergency referral for [facility]

## 2017-09-13 NOTE — Telephone Encounter (Signed)
Will do. Thanks.

## 2017-09-19 ENCOUNTER — Other Ambulatory Visit: Payer: No Typology Code available for payment source

## 2017-09-19 DIAGNOSIS — R972 Elevated prostate specific antigen [PSA]: Secondary | ICD-10-CM

## 2017-09-20 ENCOUNTER — Telehealth: Payer: Self-pay | Admitting: Internal Medicine

## 2017-09-20 LAB — PSA, TOTAL AND FREE
PSA, % Free: 10 % (calc) — ABNORMAL LOW (ref >25)
PSA, Free: 0.6 ng/mL
PSA, TOTAL: 5.8 ng/mL — AB (ref <=4.0)

## 2017-09-20 NOTE — Telephone Encounter (Signed)
Copied from [place] [id number]. Topic: Quick Communication - See Telephone Encounter >> Sep 20, 2017  8:56 AM Bea Graff, NT wrote: CRM for notification. See Telephone encounter for: 09/20/17. Pts wife Lorriane Shire calling requesting a call to see why it may be taking awhile for her husbands labs from yesterday to result.

## 2017-09-20 NOTE — Telephone Encounter (Signed)
Can you please follow up regarding lab results.

## 2017-09-21 NOTE — Telephone Encounter (Signed)
Wife notified Dr. Alain Marion reviewed lab results and sent them through mychart.

## 2017-10-06 ENCOUNTER — Telehealth: Payer: Self-pay | Admitting: Internal Medicine

## 2017-10-06 ENCOUNTER — Encounter: Payer: Self-pay | Admitting: Internal Medicine

## 2017-10-06 ENCOUNTER — Ambulatory Visit (INDEPENDENT_AMBULATORY_CARE_PROVIDER_SITE_OTHER): Payer: No Typology Code available for payment source | Admitting: Internal Medicine

## 2017-10-06 VITALS — BP 110/76 | HR 74 | Temp 98.1°F | Resp 18 | Ht 75.0 in | Wt 302.1 lb

## 2017-10-06 DIAGNOSIS — N183 Chronic kidney disease, stage 3 unspecified: Secondary | ICD-10-CM | POA: Insufficient documentation

## 2017-10-06 DIAGNOSIS — E1165 Type 2 diabetes mellitus with hyperglycemia: Secondary | ICD-10-CM

## 2017-10-06 DIAGNOSIS — Z0184 Encounter for antibody response examination: Secondary | ICD-10-CM

## 2017-10-06 DIAGNOSIS — G4733 Obstructive sleep apnea (adult) (pediatric): Secondary | ICD-10-CM | POA: Diagnosis not present

## 2017-10-06 DIAGNOSIS — R0683 Snoring: Secondary | ICD-10-CM

## 2017-10-06 DIAGNOSIS — C61 Malignant neoplasm of prostate: Secondary | ICD-10-CM

## 2017-10-06 DIAGNOSIS — Z1159 Encounter for screening for other viral diseases: Secondary | ICD-10-CM | POA: Diagnosis not present

## 2017-10-06 DIAGNOSIS — I10 Essential (primary) hypertension: Secondary | ICD-10-CM

## 2017-10-06 DIAGNOSIS — E559 Vitamin D deficiency, unspecified: Secondary | ICD-10-CM

## 2017-10-06 DIAGNOSIS — R972 Elevated prostate specific antigen [PSA]: Secondary | ICD-10-CM | POA: Diagnosis not present

## 2017-10-06 DIAGNOSIS — R0681 Apnea, not elsewhere classified: Secondary | ICD-10-CM | POA: Diagnosis not present

## 2017-10-06 DIAGNOSIS — R319 Hematuria, unspecified: Secondary | ICD-10-CM

## 2017-10-06 LAB — VITAMIN D 25 HYDROXY (VIT D DEFICIENCY, FRACTURES): VITD: 41 ng/mL (ref 30.00–100.00)

## 2017-10-06 MED ORDER — SPIRONOLACTONE 50 MG PO TABS: 50.0000 mg | ORAL_TABLET | Freq: Every day | ORAL | 1 refills | Status: DC

## 2017-10-06 MED ORDER — SPIRONOLACTONE 25 MG PO TABS: 25.0000 mg | ORAL_TABLET | Freq: Every day | ORAL | 1 refills | Status: DC

## 2017-10-06 MED ORDER — AMLODIPINE BESYLATE 5 MG PO TABS: 5.0000 mg | ORAL_TABLET | Freq: Every day | ORAL | 1 refills | Status: DC

## 2017-10-06 NOTE — Progress Notes (Signed)
Pre-visit discussion using our clinic review tool. No additional management support is needed unless otherwise documented below in the visit note.  

## 2017-10-06 NOTE — Telephone Encounter (Signed)
ROI received from [facility] on 10/06/2017.

## 2017-10-06 NOTE — Patient Instructions (Addendum)
-if your blood pressure drops <90/<60 call the clinic and  -->cut spironolactone to 25 mg  In 1/2 (12.5 mg) until your next visit  Stop lasix 20 and hydrochlorothiazide 12.5 mg daily  Stop potassium  Spironolactone do 25 mg daily (not 50 mg daily) and reduce norvasc to 5 mg daily   We will refer you to Dr. Roni Bread urology  We will refer you to pulmonology to see up sleep study   Take care and f/u in 2-3 weeks with me    Prostate-Specific Antigen Test Why am I having this test? The prostate-specific antigen (PSA) test is performed to determine how much PSA you have in your blood. PSA is a type of protein that is normally present in the prostate gland. Certain conditions can cause PSA blood levels to increase, such as:  Infection in the prostate (prostatitis).  Enlargement of the prostate (hypertrophy).  Prostate cancer.  Because PSA levels increase greatly from prostate cancer, this test can be used to confirm a diagnosis of prostate cancer. It may also be used to monitor treatment for prostate cancer and to watch for a return of prostate cancer after treatment has finished. This test has a very high false-positive rate. Therefore, routine PSA screening for all men is no longer recommended. A false-positive result is incorrect because it indicates a condition or finding is present when it is not. What kind of sample is taken? A blood sample is required for this test. It is usually collected by inserting a needle into a vein or by sticking a finger with a small needle. How do I prepare for this test? There is no preparation required for this test. However, there are factors that can affect the results of a PSA test. To get the most accurate results:  Avoid having a rectal exam within several hours before having your blood drawn for this test.  Avoid having any procedures performed on the prostate gland within 6 weeks of having this test.  Avoid ejaculating within 24 hours of having this  test.  Tell your health care provider if you had a recent urinary tract infection (UTI).  Tell your health care provider if you are taking medicines to assist with hair growth, such as finasteride.  Tell your health care provider if you have been exposed to a medicine called diethylstilbestrol.  Let your health care provider know if any of these factors apply to you. You may be asked to reschedule the test. What are the reference ranges? Reference ranges are established after testing a large group of people. Reference ranges may vary among different people, labs, and hospitals. It is your responsibility to obtain your test results. Ask the lab or department performing the test when and how you will get your results.  Low: 0-2.5 ng/mL.  Slightly to moderately elevated: 2.6-10.0 ng/mL.  Moderately elevated: 10.0-19.9 ng/mL.  Significantly elevated: 20 ng/mL or greater.  What do the results mean? PSA test results greater than 4 ng/mL are found in the majority of men with prostate cancer. If your test result is above this level, this can indicate an increased risk for prostate cancer. Increased PSA levels can also indicate other health conditions. Talk with your health care provider to discuss your results, treatment options, and if necessary, the need for more tests. Talk with your health care provider if you have any questions about your results. Talk with your health care provider to discuss your results, treatment options, and if necessary, the need for more  tests. Talk with your health care provider if you have any questions about your results. This information is not intended to replace advice given to you by your health care provider. Make sure you discuss any questions you have with your health care provider. Document Released: 04/16/2004 Document Revised: 11/18/2015 Document Reviewed: 08/07/2013 Elsevier Interactive Patient Education  2018 [employer].  Hypertension Hypertension,  commonly called high blood pressure, is when the force of blood pumping through the arteries is too strong. The arteries are the blood vessels that carry blood from the heart throughout the body. Hypertension forces the heart to work harder to pump blood and may cause arteries to become narrow or stiff. Having untreated or uncontrolled hypertension can cause heart attacks, strokes, kidney disease, and other problems. A blood pressure reading consists of a higher number over a lower number. Ideally, your blood pressure should be below 120/80. The first ("top") number is called the systolic pressure. It is a measure of the pressure in your arteries as your heart beats. The second ("bottom") number is called the diastolic pressure. It is a measure of the pressure in your arteries as the heart relaxes. What are the causes? The cause of this condition is not known. What increases the risk? Some risk factors for high blood pressure are under your control. Others are not. Factors you can change  Smoking.  Having type 2 diabetes mellitus, high cholesterol, or both.  Not getting enough exercise or physical activity.  Being overweight.  Having too much fat, sugar, calories, or salt (sodium) in your diet.  Drinking too much alcohol. Factors that are difficult or impossible to change  Having chronic kidney disease.  Having a family history of high blood pressure.  Age. Risk increases with age.  Race. You may be at higher risk if you are African-American.  Gender. Men are at higher risk than women before age 38. After age 66, women are at higher risk than men.  Having obstructive sleep apnea.  Stress. What are the signs or symptoms? Extremely high blood pressure (hypertensive crisis) may cause:  Headache.  Anxiety.  Shortness of breath.  Nosebleed.  Nausea and vomiting.  Severe chest pain.  Jerky movements you cannot control (seizures).  How is this diagnosed? This condition is  diagnosed by measuring your blood pressure while you are seated, with your arm resting on a surface. The cuff of the blood pressure monitor will be placed directly against the skin of your upper arm at the level of your heart. It should be measured at least twice using the same arm. Certain conditions can cause a difference in blood pressure between your right and left arms. Certain factors can cause blood pressure readings to be lower or higher than normal (elevated) for a short period of time:  When your blood pressure is higher when you are in a health care provider's office than when you are at home, this is called white coat hypertension. Most people with this condition do not need medicines.  When your blood pressure is higher at home than when you are in a health care provider's office, this is called masked hypertension. Most people with this condition may need medicines to control blood pressure.  If you have a high blood pressure reading during one visit or you have normal blood pressure with other risk factors:  You may be asked to return on a different day to have your blood pressure checked again.  You may be asked to monitor your  blood pressure at home for 1 week or longer.  If you are diagnosed with hypertension, you may have other blood or imaging tests to help your health care provider understand your overall risk for other conditions. How is this treated? This condition is treated by making healthy lifestyle changes, such as eating healthy foods, exercising more, and reducing your alcohol intake. Your health care provider may prescribe medicine if lifestyle changes are not enough to get your blood pressure under control, and if:  Your systolic blood pressure is above 130.  Your diastolic blood pressure is above 80.  Your personal target blood pressure may vary depending on your medical conditions, your age, and other factors. Follow these instructions at home: Eating and  drinking  Eat a diet that is high in fiber and potassium, and low in sodium, added sugar, and fat. An example eating plan is called the DASH (Dietary Approaches to Stop Hypertension) diet. To eat this way: ? Eat plenty of fresh fruits and vegetables. Try to fill half of your plate at each meal with fruits and vegetables. ? Eat whole grains, such as whole wheat pasta, brown rice, or whole grain bread. Fill about one quarter of your plate with whole grains. ? Eat or drink low-fat dairy products, such as skim milk or low-fat yogurt. ? Avoid fatty cuts of meat, processed or cured meats, and poultry with skin. Fill about one quarter of your plate with lean proteins, such as fish, chicken without skin, beans, eggs, and tofu. ? Avoid premade and processed foods. These tend to be higher in sodium, added sugar, and fat.  Reduce your daily sodium intake. Most people with hypertension should eat less than 1,500 mg of sodium a day.  Limit alcohol intake to no more than 1 drink a day for nonpregnant women and 2 drinks a day for men. One drink equals 12 oz of beer, 5 oz of wine, or 1 oz of hard liquor. Lifestyle  Work with your health care provider to maintain a healthy body weight or to lose weight. Ask what an ideal weight is for you.  Get at least 30 minutes of exercise that causes your heart to beat faster (aerobic exercise) most days of the week. Activities may include walking, swimming, or biking.  Include exercise to strengthen your muscles (resistance exercise), such as pilates or lifting weights, as part of your weekly exercise routine. Try to do these types of exercises for 30 minutes at least 3 days a week.  Do not use any products that contain nicotine or tobacco, such as cigarettes and e-cigarettes. If you need help quitting, ask your health care provider.  Monitor your blood pressure at home as told by your health care provider.  Keep all follow-up visits as told by your health care  provider. This is important. Medicines  Take over-the-counter and prescription medicines only as told by your health care provider. Follow directions carefully. Blood pressure medicines must be taken as prescribed.  Do not skip doses of blood pressure medicine. Doing this puts you at risk for problems and can make the medicine less effective.  Ask your health care provider about side effects or reactions to medicines that you should watch for. Contact a health care provider if:  You think you are having a reaction to a medicine you are taking.  You have headaches that keep coming back (recurring).  You feel dizzy.  You have swelling in your ankles.  You have trouble with your vision. Get  help right away if:  You develop a severe headache or confusion.  You have unusual weakness or numbness.  You feel faint.  You have severe pain in your chest or abdomen.  You vomit repeatedly.  You have trouble breathing. Summary  Hypertension is when the force of blood pumping through your arteries is too strong. If this condition is not controlled, it may put you at risk for serious complications.  Your personal target blood pressure may vary depending on your medical conditions, your age, and other factors. For most people, a normal blood pressure is less than 120/80.  Hypertension is treated with lifestyle changes, medicines, or a combination of both. Lifestyle changes include weight loss, eating a healthy, low-sodium diet, exercising more, and limiting alcohol. This information is not intended to replace advice given to you by your health care provider. Make sure you discuss any questions you have with your health care provider. Document Released: 03/14/2005 Document Revised: 02/10/2016 Document Reviewed: 02/10/2016 Elsevier Interactive Patient Education  [employer].
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
## 2017-10-07 LAB — URINALYSIS, ROUTINE W REFLEX MICROSCOPIC
Bacteria, UA: NONE SEEN /HPF
Bilirubin Urine: NEGATIVE
GLUCOSE, UA: NEGATIVE
HYALINE CAST: NONE SEEN /LPF
Hgb urine dipstick: NEGATIVE
Ketones, ur: NEGATIVE
Nitrite: NEGATIVE
PROTEIN: NEGATIVE
RBC / HPF: NONE SEEN /HPF (ref 0–2)
Specific Gravity, Urine: 1.018 (ref 1.001–1.03)
pH: 6 (ref 5.0–8.0)

## 2017-10-07 LAB — URINE CULTURE
MICRO NUMBER:: 90828209
RESULT: NO GROWTH
SPECIMEN QUALITY:: ADEQUATE

## 2017-10-09 ENCOUNTER — Telehealth: Payer: Self-pay | Admitting: Internal Medicine

## 2017-10-09 LAB — MEASLES/MUMPS/RUBELLA IMMUNITY
Mumps IgG: >300 AU/mL
Rubella: 6.33 index

## 2017-10-09 LAB — HEPATITIS B SURFACE ANTIBODY, QUANTITATIVE

## 2017-10-09 NOTE — Telephone Encounter (Signed)
ROI received from [facility] on 10/09/17.

## 2017-10-16 ENCOUNTER — Encounter: Payer: Self-pay | Admitting: Internal Medicine

## 2017-10-16 ENCOUNTER — Telehealth: Payer: Self-pay | Admitting: Internal Medicine

## 2017-10-16 NOTE — Telephone Encounter (Signed)
Call [facility] [phone] I did not see urine results from most recent DOT physical in 2019 please fax to me   [place]

## 2017-10-16 NOTE — Telephone Encounter (Signed)
Mychart message has been sent to ask patient to have them fax over resuklts.

## 2017-10-19 ENCOUNTER — Encounter: Payer: Self-pay | Admitting: Internal Medicine

## 2017-10-19 ENCOUNTER — Ambulatory Visit: Payer: No Typology Code available for payment source | Admitting: Internal Medicine

## 2017-11-10 ENCOUNTER — Other Ambulatory Visit: Payer: No Typology Code available for payment source

## 2017-11-10 ENCOUNTER — Ambulatory Visit: Payer: No Typology Code available for payment source | Admitting: Internal Medicine

## 2017-11-14 ENCOUNTER — Ambulatory Visit (INDEPENDENT_AMBULATORY_CARE_PROVIDER_SITE_OTHER): Payer: No Typology Code available for payment source | Admitting: Pulmonary Disease

## 2017-11-14 ENCOUNTER — Encounter: Payer: Self-pay | Admitting: Pulmonary Disease

## 2017-11-14 VITALS — BP 130/80 | HR 78 | Ht 75.0 in | Wt 301.0 lb

## 2017-11-14 DIAGNOSIS — G4733 Obstructive sleep apnea (adult) (pediatric): Secondary | ICD-10-CM | POA: Diagnosis not present

## 2017-11-14 NOTE — Patient Instructions (Signed)
Will arrange for home sleep study Will call to arrange for follow up after sleep study reviewed  

## 2017-11-14 NOTE — Progress Notes (Signed)
[facility]  Chief Complaint  Patient presents with  . sleep consult    per Karlyn Agee- no prior sleep study. c/o loud snoring on back, daytime sleepiness & sleep taking.     Constitutional: BP 130/80 (BP Location: Left Arm, Cuff Size: Normal)   Pulse 78   Ht 6\' 3"  (1.905 m)   Wt (!) 301 lb (136.5 kg)   SpO2 97%   BMI 37.62 kg/m   History of Present Illness: Nicholas Carr is a 57 y.o. male with obstructive sleep apnea.  He had sleep study in 2011.  He was found to have moderate sleep apnea.  He had trouble with CPAP related to feeling like pressure was too much.  He is a Proofreader.  He had recent physical and there was concern he could still have sleep apnea.  He still snores and will stop breathing at time while asleep.    He goes to sleep at 10 pm.  He falls asleep in 10 minutes.  He wakes up 3 times to use the bathroom.  He gets out of bed at 4 am.  He feels tired in the morning.  He denies morning headache.  He does not use anything to help him fall sleep or stay awake.  He denies sleep walking, sleep talking, bruxism, or nightmares.  There is no history of restless legs.  He denies sleep hallucinations, sleep paralysis, or cataplexy.  The Epworth score is 10 out of 24.  He gets episodes of reflux at night, and gets cramps in his legs in the early morning hours.  Comprehensive Respiratory Exam:  Appearance - well kempt  ENMT - nasal mucosa moist, turbinates clear, midline nasal septum, no dental lesions, no gingival bleeding, no oral exudates, no tonsillar hypertrophy Neck - no masses, trachea midline, no thyromegaly, no elevation in JVP Respiratory - normal appearance of chest wall, normal respiratory effort w/o accessory muscle use, no dullness on percussion, no wheezing or rales CV - s1s2 regular rate and rhythm, no murmurs, no peripheral edema, radial pulses symmetric GI - soft, non tender, no masses Lymph - no  adenopathy noted in neck and axillary areas MSK - normal muscle strength and tone, normal gait Ext - no cyanosis, clubbing, or joint inflammation noted Skin - no rashes, lesions, or ulcers Neuro - oriented to person, place, and time Psych - normal mood and affect  Discussion: He has snoring, sleep disruption, apnea, and daytime sleepiness.  He has nocturnal reflux and leg cramps, and has nocturia.  His BMI is > 35.  He has history of hypertension and prior history of sleep apnea.  He is a Proofreader.  Assessment/Plan:  Snoring with excessive daytime sleepiness. - will need to arrange for a home sleep study  Obesity. - discussed how weight can impact sleep and risk for sleep disordered breathing - discussed options to assist with weight loss: combination of diet modification, cardiovascular and strength training exercises  Cardiovascular risk. - had an extensive discussion regarding the adverse health consequences related to untreated sleep disordered breathing - specifically discussed the risks for hypertension, coronary artery disease, cardiac dysrhythmias, cerebrovascular disease, and diabetes - lifestyle modification discussed  Safe driving practices. - discussed how sleep disruption can increase risk of accidents, particularly when driving - safe driving practices were discussed  Therapies for obstructive sleep apnea. - if the sleep study shows significant sleep apnea, then various therapies for treatment were reviewed: CPAP, oral appliance, and  surgical interventions    Patient Instructions  Will arrange for home sleep study Will call to arrange for follow up after sleep study reviewed     Chesley Mires, MD [place] 11/14/2017, 10:14 AM  Flow Sheet  Sleep tests: HST 10/04/09 >> AHI 21  Review of Systems: All negative  Past Medical History: He  has a past medical history of Acne, Depression, Diabetes mellitus without complication  ([place]), Elevated PSA, Hyperlipidemia, Hypertension, OSA on CPAP, Vitamin D deficiency, and Vitamin D deficiency.  Past Surgical History: He  has a past surgical history that includes exploratoy abdominal surgery (1985) and Inguinal hernia repair.  Family History: His family history includes Asthma in his other; Cancer in his other; Diabetes in his brother, maternal grandmother, and other; Heart disease in his other; Hyperlipidemia in his other; Hypertension in his other; Mental illness in his father; Obesity in his other; Sleep apnea in his other; Stroke in his other.  Social History: He  reports that he quit smoking about 20 years ago. His smoking use included cigarettes. He has a 24.00 pack-year smoking history. He has never used smokeless tobacco. He reports that he drinks alcohol. He reports that he does not use drugs.  Medications: Allergies as of 11/14/2017      Reactions   Boniva [ibandronic Acid]    achy   Lorazepam    Deep sleep   Nortriptyline    A very deep sleep   Wellbutrin [bupropion]    nightmares      Medication List        Accurate as of 11/14/17 10:14 AM. Always use your most recent med list.          alfuzosin 10 MG 24 hr tablet Commonly known as:  UROXATRAL TAKE 1 TABLET BY MOUTH DAILY.   amLODipine 5 MG tablet Commonly known as:  NORVASC Take 1 tablet (5 mg total) by mouth daily.   aspirin 81 MG tablet Take 162 mg by mouth daily.   candesartan 32 MG tablet Commonly known as:  ATACAND Take 1 tablet (32 mg total) by mouth daily.   carvedilol 25 MG tablet Commonly known as:  COREG Take 1 tablet (25 mg total) by mouth 2 (two) times daily with a meal.   chlorthalidone 25 MG tablet Commonly known as:  HYGROTON TAKE 1 TABLET BY MOUTH DAILY.   cholecalciferol 1000 units tablet Commonly known as:  VITAMIN D Take 1,000 Units by mouth daily.   freestyle lancets Use as instructed   FREESTYLE LITE Devi As directed   glucose blood test strip Use  to check blood sugars twice a day.   hydrALAZINE 50 MG tablet Commonly known as:  APRESOLINE Take 1 tablet (50 mg total) by mouth 3 (three) times daily as needed (for systolic BP >073).   lovastatin 40 MG tablet Commonly known as:  MEVACOR TAKE 1 TABLET BY MOUTH AT BEDTIME   metFORMIN 500 MG 24 hr tablet Commonly known as:  GLUCOPHAGE-XR Take 2 tablets (1,000 mg total) by mouth daily with breakfast.   pantoprazole 40 MG tablet Commonly known as:  PROTONIX TAKE 1 TABLET (40 MG TOTAL) BY MOUTH DAILY.   pioglitazone 30 MG tablet Commonly known as:  ACTOS Take 1 tablet (30 mg total) by mouth daily.   sildenafil 100 MG tablet Commonly known as:  VIAGRA Take 1 tablet (100 mg total) by mouth as needed for erectile dysfunction.   sitaGLIPtin 100 MG tablet Commonly known as:  JANUVIA Take 1  tablet (100 mg total) by mouth daily.   spironolactone 25 MG tablet Commonly known as:  ALDACTONE Take 1 tablet (25 mg total) by mouth daily. In am

## 2017-11-30 ENCOUNTER — Telehealth: Payer: Self-pay | Admitting: Internal Medicine

## 2017-11-30 ENCOUNTER — Telehealth: Payer: Self-pay

## 2017-11-30 NOTE — Telephone Encounter (Signed)
Patient 's wife has been notified  

## 2017-11-30 NOTE — Telephone Encounter (Signed)
Reduce norvasc to 5 mg qd  cont coreg 25 mg bid -taking 25 mg qd will change 12.5 mg bid 1/2 25 mg qd   Stop chlorthalidone 12. mg qd  Continue spironolactone 12.5 mg qd if BP <90s/<60s call the clinic  Call clinic in 1 week for update   [place]

## 2017-11-30 NOTE — Telephone Encounter (Signed)
Patient is having issues since yesterday with BP being low. On 11-29-17 it was 70/69 patient states that he feels whoozy in his head. Patient has no reading on today. Does medication need to be adjusted. Please notify wife.

## 2017-11-30 NOTE — Telephone Encounter (Signed)
Reduce norvasc to 5 mg qd cont coreg 25 mg bid  Stop chlorthalidone 12. mg qd  Continue spironolactone 12.5 mg qd if BP <90s/<60s call the clinic  Call clinic in 1 week for update

## 2017-12-05 ENCOUNTER — Other Ambulatory Visit: Payer: Self-pay | Admitting: Internal Medicine

## 2017-12-11 DIAGNOSIS — G4733 Obstructive sleep apnea (adult) (pediatric): Secondary | ICD-10-CM | POA: Diagnosis not present

## 2017-12-12 DIAGNOSIS — G4733 Obstructive sleep apnea (adult) (pediatric): Secondary | ICD-10-CM | POA: Diagnosis not present

## 2017-12-13 ENCOUNTER — Other Ambulatory Visit: Payer: Self-pay | Admitting: *Deleted

## 2017-12-13 DIAGNOSIS — G4733 Obstructive sleep apnea (adult) (pediatric): Secondary | ICD-10-CM

## 2017-12-13 DIAGNOSIS — C61 Malignant neoplasm of prostate: Secondary | ICD-10-CM | POA: Insufficient documentation

## 2017-12-14 ENCOUNTER — Telehealth: Payer: Self-pay | Admitting: Pulmonary Disease

## 2017-12-14 NOTE — Telephone Encounter (Signed)
HST 12/11/17 >> AHI 21.9, SpO2 low 81%   Please inform him that his sleep study shows moderate obstructive sleep apnea.  Please arrange for ROV with me or NP to discuss treatment options.

## 2017-12-15 ENCOUNTER — Encounter: Payer: Self-pay | Admitting: Adult Health

## 2017-12-15 ENCOUNTER — Ambulatory Visit: Payer: No Typology Code available for payment source | Admitting: Adult Health

## 2017-12-15 DIAGNOSIS — G4733 Obstructive sleep apnea (adult) (pediatric): Secondary | ICD-10-CM | POA: Diagnosis not present

## 2017-12-15 DIAGNOSIS — E669 Obesity, unspecified: Secondary | ICD-10-CM | POA: Diagnosis not present

## 2017-12-15 NOTE — Telephone Encounter (Signed)
Attempted to call patient today regarding results. I did not receive an answer at time of call. I have left a voicemail message for pt to return call. X1  

## 2017-12-15 NOTE — Progress Notes (Signed)
@Patient  ID: Nicholas Carr, male    DOB: 02-Sep-1960, 57 y.o.   MRN: [id number]  Chief Complaint  Patient presents with  . Follow-up    OSA     Referring provider: McLean-Scocuzza, Olivia Mackie *  HPI: 57 year old male seen for sleep consult November 14, 2017 found to have moderate sleep apnea [employer] driver -[employer]   TEST  HST 06/7652 >AHI 21/hr  HST 11/2017 >AHI 21/9, O2 low 81%.  12/15/2017 Follow up : OSA  Patient returns for a one-month follow-up.  Patient was seen last visit for a sleep consult for daytime sleepiness.  Patient was set up for home sleep study that was done on December 11, 2017 that showed moderate sleep apnea with a AHI at 21/hour, SPO2 low 81%.  We discussed his sleep study results.  Went over sleep apnea with patient education.  We reviewed treatment options including weight loss oral appliance and CPAP.  Patient would like to proceed with CPAP at bedtime.. Discussed healthy weight and weight loss  Allergies  Allergen Reactions  . Boniva [Ibandronic Acid]     achy  . Lorazepam     Deep sleep  . Nortriptyline     A very deep sleep  . Wellbutrin [Bupropion]     nightmares    Immunization History  Administered Date(s) Administered  . Influenza Whole 04/22/2009  . Influenza,inj,Quad PF,6+ Mos 12/20/2013, 03/09/2015, 02/08/2016, 01/20/2017  . Pneumococcal Polysaccharide-23 10/08/2015  . Td 05/26/2010    Past Medical History:  Diagnosis Date  . Acne    ingrown hair  . Depression   . Diabetes mellitus without complication ([place])   . Elevated PSA   . Hyperlipidemia   . Hypertension   . OSA on CPAP   . Vitamin D deficiency   . Vitamin D deficiency     Tobacco History: Social History   Tobacco Use  Smoking Status Former Smoker  . Packs/day: 1.50  . Years: 16.00  . Pack years: 24.00  . Types: Cigarettes  . Last attempt to quit: 1999  . Years since quitting: 20.7  Smokeless Tobacco Never Used   Counseling given: Not Answered   Outpatient  Medications Prior to Visit  Medication Sig Dispense Refill  . ACCU-CHEK FASTCLIX LANCETS MISC CHECK BLOOD SUGAR TWICE A DAY 102 each 0  . ACCU-CHEK GUIDE test strip USE TO CHECK BLOOD SUGARS TWICE A DAY. 100 each 0  . alfuzosin (UROXATRAL) 10 MG 24 hr tablet TAKE 1 TABLET BY MOUTH DAILY. 90 tablet 3  . amLODipine (NORVASC) 5 MG tablet Take 1 tablet (5 mg total) by mouth daily. 90 tablet 1  . Blood Glucose Monitoring Suppl (FREESTYLE LITE) DEVI As directed 1 each 1  . candesartan (ATACAND) 32 MG tablet Take 1 tablet (32 mg total) by mouth daily. 90 tablet 1  . carvedilol (COREG) 25 MG tablet Take 1 tablet (25 mg total) by mouth 2 (two) times daily with a meal. 180 tablet 1  . chlorthalidone (HYGROTON) 25 MG tablet TAKE 1 TABLET BY MOUTH DAILY. (Patient taking differently: 12.5 mg. ) 90 tablet 3  . hydrALAZINE (APRESOLINE) 50 MG tablet Take 1 tablet (50 mg total) by mouth 3 (three) times daily as needed (for systolic BP >650). 270 tablet 2  . JANUVIA 100 MG tablet TAKE 1 TABLET BY MOUTH DAILY. 90 tablet 0  . lovastatin (MEVACOR) 40 MG tablet TAKE 1 TABLET BY MOUTH AT BEDTIME 90 tablet 0  . metFORMIN (GLUCOPHAGE-XR) 500 MG 24 hr tablet  TAKE 2 TABLETS (1,000 MG TOTAL) BY MOUTH DAILY WITH BREAKFAST. 90 tablet 0  . pantoprazole (PROTONIX) 40 MG tablet TAKE 1 TABLET (40 MG TOTAL) BY MOUTH DAILY. 30 tablet 11  . pioglitazone (ACTOS) 30 MG tablet Take 1 tablet (30 mg total) by mouth daily. 90 tablet 3  . spironolactone (ALDACTONE) 25 MG tablet Take 1 tablet (25 mg total) by mouth daily. In am (Patient taking differently: Take 12.5 mg by mouth daily. In am) 90 tablet 1  . aspirin 81 MG tablet Take 162 mg by mouth daily.    . sildenafil (VIAGRA) 100 MG tablet Take 1 tablet (100 mg total) by mouth as needed for erectile dysfunction. 12 tablet 5  . cholecalciferol (VITAMIN D) 1000 UNITS tablet Take 1,000 Units by mouth daily.     No facility-administered medications prior to visit.      Review of  Systems  Constitutional:   No  weight loss, night sweats,  Fevers, chills, fatigue, or  lassitude.  HEENT:   No headaches,  Difficulty swallowing,  Tooth/dental problems, or  Sore throat,                No sneezing, itching, ear ache, nasal congestion, post nasal drip,   CV:  No chest pain,  Orthopnea, PND, swelling in lower extremities, anasarca, dizziness, palpitations, syncope.   GI  No heartburn, indigestion, abdominal pain, nausea, vomiting, diarrhea, change in bowel habits, loss of appetite, bloody stools.   Resp: No shortness of breath with exertion or at rest.  No excess mucus, no productive cough,  No non-productive cough,  No coughing up of blood.  No change in color of mucus.  No wheezing.  No chest wall deformity  Skin: no rash or lesions.  GU: no dysuria, change in color of urine, no urgency or frequency.  No flank pain, no hematuria   MS:  No joint pain or swelling.  No decreased range of motion.  No back pain.    Physical Exam  BP 132/80 (BP Location: Left Arm, Cuff Size: Large)   Pulse 81   Ht 6\' 3"  (1.905 m)   Wt (!) 300 lb 3.2 oz (136.2 kg)   SpO2 98%   BMI 37.52 kg/m   GEN: A/Ox3; pleasant , NAD, obese    HEENT:  [place]/AT,  EACs-clear, TMs-wnl, NOSE-clear, THROAT-clear, no lesions, no postnasal drip or exudate noted. Class 3 MP airway   NECK:  Supple w/ fair ROM; no JVD; normal carotid impulses w/o bruits; no thyromegaly or nodules palpated; no lymphadenopathy.    RESP  Clear  P & A; w/o, wheezes/ rales/ or rhonchi. no accessory muscle use, no dullness to percussion  CARD:  RRR, no m/r/g, no peripheral edema, pulses intact, no cyanosis or clubbing.  GI:   Soft & nt; nml bowel sounds; no organomegaly or masses detected.   Musco: Warm bil, no deformities or joint swelling noted.   Neuro: alert, no focal deficits noted.    Skin: Warm, no lesions or rashes    Lab Results:    BNP No results found for: BNP  ProBNP No results found for:  PROBNP  Imaging: No results found.   Assessment & Plan:   Obstructive sleep apnea Moderate OSA - Begin CPAP   Plan  Patient Instructions  Begin CPAP at bedtime Goal was to wear for at least 4 hours or more each night Work on healthy weight Do not drive a sleepy Follow-up with Dr. Halford Chessman or Royal Piedra NP  6-8 weeks and As needed       Obesity (BMI 30-39.9) Wt loss      Rexene Edison, NP 12/15/2017

## 2017-12-15 NOTE — Telephone Encounter (Signed)
Pt is aware of results and apt made today at 3:30pm with TP. Nothing more needed at this time.

## 2017-12-15 NOTE — Assessment & Plan Note (Signed)
Wt loss  

## 2017-12-15 NOTE — Progress Notes (Signed)
Reviewed and agree with assessment/plan.   Gagandeep Kossman, MD [facility] 03/23/2016, 12:24 PM Pager:  [phone]  

## 2017-12-15 NOTE — Assessment & Plan Note (Signed)
Moderate OSA - Begin CPAP   Plan  Patient Instructions  Begin CPAP at bedtime Goal was to wear for at least 4 hours or more each night Work on healthy weight Do not drive a sleepy Follow-up with Dr. Halford Chessman or Andreia Gandolfi NP  6-8 weeks and As needed

## 2017-12-15 NOTE — Telephone Encounter (Signed)
Pt is calling back [phone]

## 2017-12-15 NOTE — Addendum Note (Signed)
Addended by: Parke Poisson E on: 12/15/2017 05:15 PM   Modules accepted: Orders

## 2017-12-15 NOTE — Patient Instructions (Signed)
Begin CPAP at bedtime Goal was to wear for at least 4 hours or more each night Work on healthy weight Do not drive a sleepy Follow-up with Dr. Halford Chessman or Parrett NP  6-8 weeks and As needed

## 2017-12-20 ENCOUNTER — Other Ambulatory Visit: Payer: Self-pay | Admitting: Urology

## 2017-12-20 DIAGNOSIS — C61 Malignant neoplasm of prostate: Secondary | ICD-10-CM

## 2018-01-01 ENCOUNTER — Telehealth: Payer: Self-pay | Admitting: Family

## 2018-01-01 ENCOUNTER — Other Ambulatory Visit: Payer: Self-pay | Admitting: Family

## 2018-01-01 ENCOUNTER — Encounter (HOSPITAL_COMMUNITY)
Admission: RE | Admit: 2018-01-01 | Discharge: 2018-01-01 | Disposition: A | Discharge status: Home / Self Care | Payer: No Typology Code available for payment source | Source: Ambulatory Visit | Attending: Urology | Admitting: Urology

## 2018-01-01 DIAGNOSIS — C61 Malignant neoplasm of prostate: Secondary | ICD-10-CM | POA: Diagnosis not present

## 2018-01-01 DIAGNOSIS — R937 Abnormal findings on diagnostic imaging of other parts of musculoskeletal system: Secondary | ICD-10-CM | POA: Diagnosis not present

## 2018-01-01 MED ORDER — TECHNETIUM TC 99M MEDRONATE IV KIT
20.0000 | PACK | Freq: Once | INTRAVENOUS | Status: AC | PRN
  Administered 2018-01-01: 21.3 via INTRAVENOUS

## 2018-01-01 NOTE — Telephone Encounter (Signed)
Pam from [facility].  States they are trying to refax this request to Dr. Alain Marion but the fax keeps coming back as failed.  Will Dr. Alain Marion refill? Pam can be reached at [phone]

## 2018-01-01 NOTE — Telephone Encounter (Signed)
Notified Pam that patient is no longer with [facility] and he sees Mclean-Scocuzzo.

## 2018-01-02 ENCOUNTER — Other Ambulatory Visit: Payer: Self-pay | Admitting: Internal Medicine

## 2018-01-02 MED ORDER — CANDESARTAN CILEXETIL 32 MG PO TABS: 32.0000 mg | ORAL_TABLET | Freq: Every day | ORAL | 3 refills | Status: DC

## 2018-01-04 ENCOUNTER — Other Ambulatory Visit: Payer: Self-pay | Admitting: Urology

## 2018-01-04 DIAGNOSIS — C61 Malignant neoplasm of prostate: Secondary | ICD-10-CM

## 2018-01-16 ENCOUNTER — Ambulatory Visit (HOSPITAL_COMMUNITY)
Admission: RE | Admit: 2018-01-16 | Discharge: 2018-01-16 | Disposition: A | Discharge status: Home / Self Care | Payer: No Typology Code available for payment source | Source: Ambulatory Visit | Attending: Urology | Admitting: Urology

## 2018-01-16 DIAGNOSIS — C61 Malignant neoplasm of prostate: Secondary | ICD-10-CM | POA: Insufficient documentation

## 2018-01-16 MED ORDER — AXUMIN (FLUCICLOVINE F 18) INJECTION
9.9200 | Freq: Once | INTRAVENOUS | Status: AC
  Administered 2018-01-16: 9.92 via INTRAVENOUS

## 2018-01-16 MED ORDER — FLUDEOXYGLUCOSE F - 18 (FDG) INJECTION: 9.9200 | Freq: Once | INTRAVENOUS | Status: DC | PRN

## 2018-01-25 ENCOUNTER — Encounter: Payer: Self-pay | Admitting: Internal Medicine

## 2018-01-29 ENCOUNTER — Other Ambulatory Visit: Payer: Self-pay | Admitting: Internal Medicine

## 2018-01-29 ENCOUNTER — Other Ambulatory Visit: Payer: Self-pay | Admitting: Urology

## 2018-02-13 ENCOUNTER — Other Ambulatory Visit: Payer: Self-pay | Admitting: Internal Medicine

## 2018-02-13 DIAGNOSIS — I10 Essential (primary) hypertension: Secondary | ICD-10-CM

## 2018-02-13 DIAGNOSIS — E119 Type 2 diabetes mellitus without complications: Secondary | ICD-10-CM

## 2018-02-13 MED ORDER — SPIRONOLACTONE 25 MG PO TABS: 12.5000 mg | ORAL_TABLET | Freq: Every day | ORAL | Status: DC

## 2018-02-13 MED ORDER — PIOGLITAZONE HCL 30 MG PO TABS: 30.0000 mg | ORAL_TABLET | Freq: Every day | ORAL | 3 refills | Status: DC | PRN

## 2018-02-13 MED ORDER — CARVEDILOL 25 MG PO TABS: 12.5000 mg | ORAL_TABLET | Freq: Every day | ORAL | Status: DC

## 2018-02-15 NOTE — Patient Instructions (Addendum)
Nicholas Carr  02/15/2018   Your procedure is scheduled on: 02-26-18   Report to [facility] Main  Entrance    Report to admitting at 9:15AM    Call this number if you have problems the morning of surgery [phone]   PLEASE BRING CPAP MASK AND  TUBING ONLY. DEVICE WILL BE PROVIDED!    Remember: FOLLOW ALL BOWEL PREP ORDERS PROVIDED BY YOUR SURGEON!!  Do not eat food or drink liquids :After Midnight. BRUSH YOUR TEETH MORNING OF SURGERY AND RINSE YOUR MOUTH OUT, NO CHEWING GUM CANDY OR MINTS.      Take these medicines the morning of surgery with A SIP OF WATER: AMLODIPINE, CARVEDILOL, HYDRALAZINE IF NEEDED, PANTOPRAZOLE  DO NOT TAKE ANY DIABETIC MEDICATIONS DAY OF YOUR SURGERY!!!                                You may not have any metal on your body including hair pins and              piercings  Do not wear jewelry, make-up, lotions, powders or perfumes, deodorant                 Men may shave face and neck.   Do not bring valuables to the hospital. [employer].  Contacts, dentures or bridgework may not be worn into surgery.      Patients discharged the day of surgery will not be allowed to drive home.  Name and phone number of your driver:  Special Instructions: N/A              Please read over the following fact sheets you were given: _____________________________________________________________________             [facility] - Preparing for Surgery Before surgery, you can play an important role.  Because skin is not sterile, your skin needs to be as free of germs as possible.  You can reduce the number of germs on your skin by washing with CHG (chlorahexidine gluconate) soap before surgery.  CHG is an antiseptic cleaner which kills germs and bonds with the skin to continue killing germs even after washing. Please DO NOT use if you have an allergy to CHG or antibacterial soaps.  If your skin  becomes reddened/irritated stop using the CHG and inform your nurse when you arrive at Short Stay. Do not shave (including legs and underarms) for at least 48 hours prior to the first CHG shower.  You may shave your face/neck. Please follow these instructions carefully:  1.  Shower with CHG Soap the night before surgery and the  morning of Surgery.  2.  If you choose to wash your hair, wash your hair first as usual with your  normal  shampoo.  3.  After you shampoo, rinse your hair and body thoroughly to remove the  shampoo.                           4.  Use CHG as you would any other liquid soap.  You can apply chg directly  to the skin and wash  Gently with a scrungie or clean washcloth.  5.  Apply the CHG Soap to your body ONLY FROM THE NECK DOWN.   Do not use on face/ open                           Wound or open sores. Avoid contact with eyes, ears mouth and genitals (private parts).                       Wash face,  Genitals (private parts) with your normal soap.             6.  Wash thoroughly, paying special attention to the area where your surgery  will be performed.  7.  Thoroughly rinse your body with warm water from the neck down.  8.  DO NOT shower/wash with your normal soap after using and rinsing off  the CHG Soap.                9.  Pat yourself dry with a clean towel.            10.  Wear clean pajamas.            11.  Place clean sheets on your bed the night of your first shower and do not  sleep with pets. Day of Surgery : Do not apply any lotions/deodorants the morning of surgery.  Please wear clean clothes to the hospital/surgery center.  FAILURE TO FOLLOW THESE INSTRUCTIONS MAY RESULT IN THE CANCELLATION OF YOUR SURGERY PATIENT SIGNATURE_________________________________  NURSE SIGNATURE__________________________________  ________________________________________________________________________   Nicholas Carr  An incentive spirometer is a  tool that can help keep your lungs clear and active. This tool measures how well you are filling your lungs with each breath. Taking long deep breaths may help reverse or decrease the chance of developing breathing (pulmonary) problems (especially infection) following:  A long period of time when you are unable to move or be active. BEFORE THE PROCEDURE   If the spirometer includes an indicator to show your best effort, your nurse or respiratory therapist will set it to a desired goal.  If possible, sit up straight or lean slightly forward. Try not to slouch.  Hold the incentive spirometer in an upright position. INSTRUCTIONS FOR USE  1. Sit on the edge of your bed if possible, or sit up as far as you can in bed or on a chair. 2. Hold the incentive spirometer in an upright position. 3. Breathe out normally. 4. Place the mouthpiece in your mouth and seal your lips tightly around it. 5. Breathe in slowly and as deeply as possible, raising the piston or the ball toward the top of the column. 6. Hold your breath for 3-5 seconds or for as long as possible. Allow the piston or ball to fall to the bottom of the column. 7. Remove the mouthpiece from your mouth and breathe out normally. 8. Rest for a few seconds and repeat Steps 1 through 7 at least 10 times every 1-2 hours when you are awake. Take your time and take a few normal breaths between deep breaths. 9. The spirometer may include an indicator to show your best effort. Use the indicator as a goal to work toward during each repetition. 10. After each set of 10 deep breaths, practice coughing to be sure your lungs are clear. If you have an incision (the cut made at the time of surgery),  support your incision when coughing by placing a pillow or rolled up towels firmly against it. Once you are able to get out of bed, walk around indoors and cough well. You may stop using the incentive spirometer when instructed by your caregiver.  RISKS AND  COMPLICATIONS  Take your time so you do not get dizzy or light-headed.  If you are in pain, you may need to take or ask for pain medication before doing incentive spirometry. It is harder to take a deep breath if you are having pain. AFTER USE  Rest and breathe slowly and easily.  It can be helpful to keep track of a log of your progress. Your caregiver can provide you with a simple table to help with this. If you are using the spirometer at home, follow these instructions: [place] IF:   You are having difficultly using the spirometer.  You have trouble using the spirometer as often as instructed.  Your pain medication is not giving enough relief while using the spirometer.  You develop fever of 100.5 F (38.1 C) or higher. SEEK IMMEDIATE MEDICAL CARE IF:   You cough up bloody sputum that had not been present before.  You develop fever of 102 F (38.9 C) or greater.  You develop worsening pain at or near the incision site. MAKE SURE YOU:   Understand these instructions.  Will watch your condition.  Will get help right away if you are not doing well or get worse. Document Released: 07/25/2006 Document Revised: 06/06/2011 Document Reviewed: 09/25/2006 ExitCare Patient Information 2014 Exit[employer], [place].   ________________________________________________________________________  WHAT IS A BLOOD TRANSFUSION? Blood Transfusion Information  A transfusion is the replacement of blood or some of its parts. Blood is made up of multiple cells which provide different functions.  Red blood cells carry oxygen and are used for blood loss replacement.  White blood cells fight against infection.  Platelets control bleeding.  Plasma helps clot blood.  Other blood products are available for specialized needs, such as hemophilia or other clotting disorders. BEFORE THE TRANSFUSION  Who gives blood for transfusions?   Healthy volunteers who are fully evaluated to make sure  their blood is safe. This is blood bank blood. Transfusion therapy is the safest it has ever been in the practice of medicine. Before blood is taken from a donor, a complete history is taken to make sure that person has no history of diseases nor engages in risky social behavior (examples are intravenous drug use or sexual activity with multiple partners). The donor's travel history is screened to minimize risk of transmitting infections, such as malaria. The donated blood is tested for signs of infectious diseases, such as HIV and hepatitis. The blood is then tested to be sure it is compatible with you in order to minimize the chance of a transfusion reaction. If you or a relative donates blood, this is often done in anticipation of surgery and is not appropriate for emergency situations. It takes many days to process the donated blood. RISKS AND COMPLICATIONS Although transfusion therapy is very safe and saves many lives, the main dangers of transfusion include:   Getting an infectious disease.  Developing a transfusion reaction. This is an allergic reaction to something in the blood you were given. Every precaution is taken to prevent this. The decision to have a blood transfusion has been considered carefully by your caregiver before blood is given. Blood is not given unless the benefits outweigh the risks. AFTER THE TRANSFUSION  Right after receiving a blood transfusion, you will usually feel much better and more energetic. This is especially true if your red blood cells have gotten low (anemic). The transfusion raises the level of the red blood cells which carry oxygen, and this usually causes an energy increase.  The nurse administering the transfusion will monitor you carefully for complications. HOME CARE INSTRUCTIONS  No special instructions are needed after a transfusion. You may find your energy is better. Speak with your caregiver about any limitations on activity for underlying diseases  you may have. SEEK MEDICAL CARE IF:   Your condition is not improving after your transfusion.  You develop redness or irritation at the intravenous (IV) site. SEEK IMMEDIATE MEDICAL CARE IF:  Any of the following symptoms occur over the next 12 hours:  Shaking chills.  You have a temperature by mouth above 102 F (38.9 C), not controlled by medicine.  Chest, back, or muscle pain.  People around you feel you are not acting correctly or are confused.  Shortness of breath or difficulty breathing.  Dizziness and fainting.  You get a rash or develop hives.  You have a decrease in urine output.  Your urine turns a dark color or changes to pink, red, or brown. Any of the following symptoms occur over the next 10 days:  You have a temperature by mouth above 102 F (38.9 C), not controlled by medicine.  Shortness of breath.  Weakness after normal activity.  The white part of the eye turns yellow (jaundice).  You have a decrease in the amount of urine or are urinating less often.  Your urine turns a dark color or changes to pink, red, or brown. Document Released: 03/11/2000 Document Revised: 06/06/2011 Document Reviewed: 10/29/2007 [facility] Patient Information 2014 [place], [place].  ______________________________________________________________________

## 2018-02-16 ENCOUNTER — Encounter (HOSPITAL_COMMUNITY): Payer: Self-pay

## 2018-02-16 ENCOUNTER — Ambulatory Visit: Payer: No Typology Code available for payment source | Admitting: Pulmonary Disease

## 2018-02-16 ENCOUNTER — Encounter: Payer: Self-pay | Admitting: Pulmonary Disease

## 2018-02-16 ENCOUNTER — Encounter (HOSPITAL_COMMUNITY)
Admission: RE | Admit: 2018-02-16 | Discharge: 2018-02-16 | Disposition: A | Discharge status: Home / Self Care | Payer: No Typology Code available for payment source | Source: Ambulatory Visit | Attending: Urology | Admitting: Urology

## 2018-02-16 ENCOUNTER — Other Ambulatory Visit: Payer: Self-pay

## 2018-02-16 VITALS — BP 122/80 | HR 86 | Ht 75.0 in | Wt 294.4 lb

## 2018-02-16 DIAGNOSIS — E119 Type 2 diabetes mellitus without complications: Secondary | ICD-10-CM | POA: Insufficient documentation

## 2018-02-16 DIAGNOSIS — G4733 Obstructive sleep apnea (adult) (pediatric): Secondary | ICD-10-CM | POA: Diagnosis not present

## 2018-02-16 DIAGNOSIS — Z01818 Encounter for other preprocedural examination: Secondary | ICD-10-CM | POA: Diagnosis not present

## 2018-02-16 DIAGNOSIS — E669 Obesity, unspecified: Secondary | ICD-10-CM | POA: Diagnosis not present

## 2018-02-16 DIAGNOSIS — G4730 Sleep apnea, unspecified: Secondary | ICD-10-CM

## 2018-02-16 DIAGNOSIS — I10 Essential (primary) hypertension: Secondary | ICD-10-CM | POA: Insufficient documentation

## 2018-02-16 HISTORY — DX: Malignant (primary) neoplasm, unspecified: C80.1

## 2018-02-16 HISTORY — DX: Personal history of urinary calculi: Z87.442

## 2018-02-16 HISTORY — DX: Gastro-esophageal reflux disease without esophagitis: K21.9

## 2018-02-16 LAB — BASIC METABOLIC PANEL
Anion gap: 8 (ref 5–15)
BUN: 25 mg/dL — ABNORMAL HIGH (ref 6–20)
CHLORIDE: 107 mmol/L (ref 98–111)
CO2: 24 mmol/L (ref 22–32)
Calcium: 9.1 mg/dL (ref 8.9–10.3)
Creatinine, Ser: 1.64 mg/dL — ABNORMAL HIGH (ref 0.61–1.24)
GFR, EST AFRICAN AMERICAN: 53 mL/min — AB (ref >60)
GFR, EST NON AFRICAN AMERICAN: 46 mL/min — AB (ref >60)
Glucose, Bld: 123 mg/dL — ABNORMAL HIGH (ref 70–99)
POTASSIUM: 5 mmol/L (ref 3.5–5.1)
SODIUM: 139 mmol/L (ref 135–145)

## 2018-02-16 LAB — CBC
HEMATOCRIT: 42.7 % (ref 39.0–52.0)
Hemoglobin: 13.3 g/dL (ref 13.0–17.0)
MCH: 23.3 pg — ABNORMAL LOW (ref 26.0–34.0)
MCHC: 31.1 g/dL (ref 30.0–36.0)
MCV: 74.8 fL — ABNORMAL LOW (ref 80.0–100.0)
NRBC: 0 % (ref 0.0–0.2)
Platelets: 242 10*3/uL (ref 150–400)
RBC: 5.71 MIL/uL (ref 4.22–5.81)
RDW: 15.3 % (ref 11.5–15.5)
WBC: 6.3 10*3/uL (ref 4.0–10.5)

## 2018-02-16 LAB — ABO/RH: ABO/RH(D): O POS

## 2018-02-16 LAB — GLUCOSE, CAPILLARY: Glucose-Capillary: 122 mg/dL — ABNORMAL HIGH (ref 70–99)

## 2018-02-16 LAB — HEMOGLOBIN A1C
Hgb A1c MFr Bld: 5.7 % — ABNORMAL HIGH (ref 4.8–5.6)
MEAN PLASMA GLUCOSE: 116.89 mg/dL

## 2018-02-16 NOTE — Progress Notes (Signed)
[facility]  Chief Complaint  Patient presents with  . Follow-up    pt wearing cpap avg-5-7hr nightly- feels pressure & mask are okay. wakes feeling well rested. DME:[facility]    Constitutional:  BP 122/80 (BP Location: Left Arm, Cuff Size: Normal)   Pulse 86   Ht 6\' 3"  (1.905 m)   Wt 294 lb 6.4 oz (133.5 kg)   SpO2 95%   BMI 36.80 kg/m   Past Medical History:  Depression, DM, HLD, HTN, Vitamin D deficiency, Prostate cancer  Brief Summary:  Nicholas Carr is a 57 y.o. male with obstructive sleep apnea.  He has been doing well with CPAP.  Sleeping better and feels more rested.  Has nasal pillows mask.  Now using small size.  Still has some irritation in his nose.  Had some problems with pressure feeling in his chest, but not an issue now.  Not having dry mouth or sore throat.   Physical Exam:   Appearance - well kempt   ENMT - clear nasal mucosa, midline nasal  septum, no oral exudates, no LAN, trachea midline, MP 3  Respiratory - normal chest wall, normal respiratory effort, no accessory muscle use, no wheeze/rales  CV - s1s2 regular rate and rhythm, no murmurs, no peripheral edema, radial pulses symmetric  GI - soft, non tender, no masses  Lymph - no adenopathy noted in neck and axillary areas  MSK - normal gait  Ext - no cyanosis, clubbing, or joint inflammation noted  Skin - no rashes, lesions, or ulcers  Neuro - normal strength, oriented x 3  Psych - normal mood and affect   Assessment/Plan:   Obstructive sleep apnea. 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 - discussed importance of weight loss  Patient Instructions  Follow up in 1 year    Chesley Mires, MD [place] Pager: [phone] 02/16/2018, 9:51 AM  Flow Sheet    Sleep tests:  HST 10/04/09 >> AHI 21 HST  12/11/17 >> AHI 21.9, SpO2 low 81% Auto CPAP 01/16/18 to 02/14/18 >> used on 30 of 30 nights with average 6 hrs 44 min.  Average AHI 2.3 with median CPAP 7 and 95 th percentile CPAP 9 cm H2O.  Medications:   Allergies as of 02/16/2018      Reactions   Boniva [ibandronic Acid] Other (See Comments)   achy   Lorazepam Other (See Comments)   Deep sleep   Nortriptyline Other (See Comments)   A very deep sleep   Wellbutrin [bupropion] Other (See Comments)   nightmares      Medication List        Accurate as of 02/16/18  9:51 AM. Always use your most recent med list.          ACCU-CHEK FASTCLIX LANCETS Misc CHECK BLOOD SUGAR TWICE A DAY   ACCU-CHEK GUIDE test strip Generic drug:  glucose blood USE TO CHECK BLOOD SUGARS TWICE A DAY.   alfuzosin 10 MG 24 hr tablet Commonly known as:  UROXATRAL TAKE 1 TABLET BY MOUTH DAILY.   amLODipine 5 MG tablet Commonly known as:  NORVASC Take 1 tablet (5 mg total) by mouth daily.   candesartan 32 MG tablet Commonly known as:  ATACAND Take 1 tablet (32 mg total) by mouth daily.   carvedilol 25 MG tablet Commonly known as:  COREG Take 0.5 tablets (12.5 mg total) by mouth daily.   chlorthalidone 25 MG tablet Commonly known as:  HYGROTON Take 12.5 mg by mouth daily.   FREESTYLE LITE Devi As directed   hydrALAZINE 50 MG tablet Commonly known as:  APRESOLINE Take 1 tablet (50 mg total) by mouth 3 (three) times daily as needed (for systolic BP >403).   JANUVIA 100 MG tablet Generic drug:  sitaGLIPtin TAKE 1 TABLET BY MOUTH DAILY.   lovastatin 40 MG tablet Commonly known as:  MEVACOR TAKE 1 TABLET BY MOUTH AT BEDTIME   metFORMIN 500 MG 24 hr tablet Commonly known as:  GLUCOPHAGE-XR TAKE 2 TABLETS BY MOUTH DAILY WITH BREAKFAST.   pantoprazole 40 MG tablet Commonly known as:  PROTONIX TAKE 1 TABLET (40 MG TOTAL) BY MOUTH DAILY.   pioglitazone 30 MG tablet Commonly known as:  ACTOS Take 1 tablet (30 mg total) by mouth daily  as needed. cbg >140   sildenafil 100 MG tablet Commonly known as:  VIAGRA Take 1 tablet (100 mg total) by mouth as needed for erectile dysfunction.   spironolactone 25 MG tablet Commonly known as:  ALDACTONE Take 0.5 tablets (12.5 mg total) by mouth daily. In am       Past Surgical History:  He  has a past surgical history that includes exploratoy abdominal surgery (1985) and Inguinal hernia repair.  Family History:  His family history includes Asthma in his other; Cancer in his other; Diabetes in his brother, maternal grandmother, and other; Heart disease in his other; Hyperlipidemia in his other; Hypertension in his other; Mental illness in his father; Obesity in his other; Sleep apnea in his other; Stroke in his other.  Social History:  He  reports that he quit smoking about 20 years ago. His smoking use included cigarettes. He has a 24.00 pack-year smoking history. He has never used smokeless tobacco. He reports that he drinks alcohol. He reports that he does not use drugs.

## 2018-02-16 NOTE — Progress Notes (Signed)
BMP ROUTED [place]

## 2018-02-16 NOTE — Patient Instructions (Signed)
Follow up in 1 year.
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
## 2018-02-25 MED ORDER — DEXTROSE 5 % IV SOLN
3.0000 g | INTRAVENOUS | Status: AC
  Administered 2018-02-26: 3 g via INTRAVENOUS
  Filled 2018-02-25: qty 3

## 2018-02-26 ENCOUNTER — Encounter (HOSPITAL_COMMUNITY)
Admission: RE | Disposition: A | Discharge status: Home / Self Care | Payer: Self-pay | Source: Ambulatory Visit | Attending: Urology

## 2018-02-26 ENCOUNTER — Other Ambulatory Visit: Payer: Self-pay | Admitting: Internal Medicine

## 2018-02-26 ENCOUNTER — Ambulatory Visit (HOSPITAL_COMMUNITY): Payer: No Typology Code available for payment source | Admitting: Certified Registered Nurse Anesthetist

## 2018-02-26 ENCOUNTER — Observation Stay (HOSPITAL_COMMUNITY)
Admission: RE | Admit: 2018-02-26 | Discharge: 2018-02-27 | Disposition: A | Discharge status: Home / Self Care | Payer: No Typology Code available for payment source | Source: Ambulatory Visit | Attending: Urology | Admitting: Urology

## 2018-02-26 ENCOUNTER — Encounter (HOSPITAL_COMMUNITY): Payer: Self-pay | Admitting: Emergency Medicine

## 2018-02-26 ENCOUNTER — Other Ambulatory Visit: Payer: Self-pay

## 2018-02-26 DIAGNOSIS — G4730 Sleep apnea, unspecified: Secondary | ICD-10-CM | POA: Diagnosis not present

## 2018-02-26 DIAGNOSIS — I10 Essential (primary) hypertension: Secondary | ICD-10-CM | POA: Diagnosis not present

## 2018-02-26 DIAGNOSIS — Z7984 Long term (current) use of oral hypoglycemic drugs: Secondary | ICD-10-CM | POA: Insufficient documentation

## 2018-02-26 DIAGNOSIS — Z79899 Other long term (current) drug therapy: Secondary | ICD-10-CM | POA: Insufficient documentation

## 2018-02-26 DIAGNOSIS — C61 Malignant neoplasm of prostate: Secondary | ICD-10-CM | POA: Diagnosis not present

## 2018-02-26 DIAGNOSIS — E119 Type 2 diabetes mellitus without complications: Secondary | ICD-10-CM | POA: Diagnosis not present

## 2018-02-26 HISTORY — PX: ROBOT ASSISTED LAPAROSCOPIC RADICAL PROSTATECTOMY: SHX5141

## 2018-02-26 HISTORY — PX: LYMPHADENECTOMY: SHX5960

## 2018-02-26 LAB — HEMOGLOBIN AND HEMATOCRIT, BLOOD
HCT: 44.4 % (ref 39.0–52.0)
Hemoglobin: 14 g/dL (ref 13.0–17.0)

## 2018-02-26 LAB — TYPE AND SCREEN
ABO/RH(D): O POS
ANTIBODY SCREEN: NEGATIVE

## 2018-02-26 LAB — GLUCOSE, CAPILLARY
GLUCOSE-CAPILLARY: 117 mg/dL — AB (ref 70–99)
Glucose-Capillary: 97 mg/dL (ref 70–99)
Glucose-Capillary: 144 mg/dL — ABNORMAL HIGH (ref 70–99)
Glucose-Capillary: 142 mg/dL — ABNORMAL HIGH (ref 70–99)

## 2018-02-26 SURGERY — XI ROBOTIC ASSISTED LAPAROSCOPIC RADICAL PROSTATECTOMY LEVEL 3
Anesthesia: General

## 2018-02-26 MED ORDER — MEPERIDINE HCL 50 MG/ML IJ SOLN: 6.2500 mg | INTRAMUSCULAR | Status: DC | PRN

## 2018-02-26 MED ORDER — HYDROMORPHONE HCL 1 MG/ML IJ SOLN
INTRAMUSCULAR | Status: AC
  Filled 2018-02-26: qty 1

## 2018-02-26 MED ORDER — SULFAMETHOXAZOLE-TRIMETHOPRIM 800-160 MG PO TABS: 1.0000 | ORAL_TABLET | Freq: Two times a day (BID) | ORAL | 0 refills | Status: DC

## 2018-02-26 MED ORDER — FLEET ENEMA 7-19 GM/118ML RE ENEM: 1.0000 | ENEMA | Freq: Once | RECTAL | Status: DC

## 2018-02-26 MED ORDER — KETOROLAC TROMETHAMINE 15 MG/ML IJ SOLN
15.0000 mg | Freq: Four times a day (QID) | INTRAMUSCULAR | Status: DC
  Administered 2018-02-26 – 2018-02-27 (×3): 15 mg via INTRAVENOUS
  Filled 2018-02-26 (×4): qty 1

## 2018-02-26 MED ORDER — ZOLPIDEM TARTRATE 5 MG PO TABS
5.0000 mg | ORAL_TABLET | Freq: Every evening | ORAL | Status: DC | PRN
  Administered 2018-02-27: 5 mg via ORAL
  Filled 2018-02-26: qty 1

## 2018-02-26 MED ORDER — PRAVASTATIN SODIUM 40 MG PO TABS
40.0000 mg | ORAL_TABLET | Freq: Every day | ORAL | Status: DC
  Administered 2018-02-26: 40 mg via ORAL
  Filled 2018-02-26: qty 1

## 2018-02-26 MED ORDER — HYDROMORPHONE HCL 1 MG/ML IJ SOLN
0.2500 mg | INTRAMUSCULAR | Status: DC | PRN
  Administered 2018-02-26 (×2): 0.5 mg via INTRAVENOUS

## 2018-02-26 MED ORDER — HYDRALAZINE HCL 50 MG PO TABS: 50.0000 mg | ORAL_TABLET | Freq: Three times a day (TID) | ORAL | Status: DC | PRN

## 2018-02-26 MED ORDER — DOCUSATE SODIUM 100 MG PO CAPS
100.0000 mg | ORAL_CAPSULE | Freq: Two times a day (BID) | ORAL | Status: DC
  Administered 2018-02-26 – 2018-02-27 (×2): 100 mg via ORAL
  Filled 2018-02-26 (×2): qty 1

## 2018-02-26 MED ORDER — CARVEDILOL 12.5 MG PO TABS
12.5000 mg | ORAL_TABLET | Freq: Every day | ORAL | Status: DC
  Administered 2018-02-27: 12.5 mg via ORAL
  Filled 2018-02-26: qty 1

## 2018-02-26 MED ORDER — TRAMADOL HCL 50 MG PO TABS: 50.0000 mg | ORAL_TABLET | Freq: Four times a day (QID) | ORAL | 0 refills | Status: DC | PRN

## 2018-02-26 MED ORDER — SODIUM CHLORIDE 0.45 % IV SOLN
INTRAVENOUS | Status: DC
  Administered 2018-02-26 – 2018-02-27 (×3): via INTRAVENOUS

## 2018-02-26 MED ORDER — MIDAZOLAM HCL 5 MG/5ML IJ SOLN
INTRAMUSCULAR | Status: DC | PRN
  Administered 2018-02-26: 2 mg via INTRAVENOUS

## 2018-02-26 MED ORDER — FENTANYL CITRATE (PF) 250 MCG/5ML IJ SOLN
INTRAMUSCULAR | Status: AC
  Filled 2018-02-26: qty 5

## 2018-02-26 MED ORDER — FENTANYL CITRATE (PF) 100 MCG/2ML IJ SOLN
INTRAMUSCULAR | Status: AC
  Filled 2018-02-26: qty 2

## 2018-02-26 MED ORDER — ROCURONIUM BROMIDE 50 MG/5ML IV SOSY
PREFILLED_SYRINGE | INTRAVENOUS | Status: DC | PRN
  Administered 2018-02-26: 50 mg via INTRAVENOUS
  Administered 2018-02-26: 20 mg via INTRAVENOUS
  Administered 2018-02-26: 10 mg via INTRAVENOUS
  Administered 2018-02-26: 20 mg via INTRAVENOUS
  Administered 2018-02-26: 10 mg via INTRAVENOUS
  Administered 2018-02-26 (×3): 20 mg via INTRAVENOUS

## 2018-02-26 MED ORDER — BUPIVACAINE HCL (PF) 0.25 % IJ SOLN
INTRAMUSCULAR | Status: AC
  Filled 2018-02-26: qty 30

## 2018-02-26 MED ORDER — MIDAZOLAM HCL 2 MG/2ML IJ SOLN
INTRAMUSCULAR | Status: AC
  Filled 2018-02-26: qty 2

## 2018-02-26 MED ORDER — LACTATED RINGERS IV SOLN
INTRAVENOUS | Status: DC
  Administered 2018-02-26 (×2): via INTRAVENOUS

## 2018-02-26 MED ORDER — SODIUM CHLORIDE 0.9 % IV SOLN
INTRAVENOUS | Status: DC | PRN
  Administered 2018-02-26: 40 ug/min via INTRAVENOUS

## 2018-02-26 MED ORDER — PROPOFOL 10 MG/ML IV BOLUS
INTRAVENOUS | Status: DC | PRN
  Administered 2018-02-26: 145 mg via INTRAVENOUS

## 2018-02-26 MED ORDER — LIDOCAINE 2% (20 MG/ML) 5 ML SYRINGE
INTRAMUSCULAR | Status: AC
  Filled 2018-02-26: qty 5

## 2018-02-26 MED ORDER — SUGAMMADEX SODIUM 500 MG/5ML IV SOLN
INTRAVENOUS | Status: DC | PRN
  Administered 2018-02-26: 300 mg via INTRAVENOUS

## 2018-02-26 MED ORDER — BUPIVACAINE-EPINEPHRINE (PF) 0.25% -1:200000 IJ SOLN
INTRAMUSCULAR | Status: AC
  Filled 2018-02-26: qty 30

## 2018-02-26 MED ORDER — BUPIVACAINE-EPINEPHRINE (PF) 0.25% -1:200000 IJ SOLN
INTRAMUSCULAR | Status: DC | PRN
  Administered 2018-02-26: 30 mL

## 2018-02-26 MED ORDER — LACTATED RINGERS IV SOLN
INTRAVENOUS | Status: DC | PRN
  Administered 2018-02-26: 15:00:00

## 2018-02-26 MED ORDER — IRBESARTAN 300 MG PO TABS
300.0000 mg | ORAL_TABLET | Freq: Every day | ORAL | Status: DC
  Administered 2018-02-27: 300 mg via ORAL
  Filled 2018-02-26: qty 1

## 2018-02-26 MED ORDER — SODIUM CHLORIDE 0.9 % IV BOLUS
1000.0000 mL | Freq: Once | INTRAVENOUS | Status: AC
  Administered 2018-02-26: 1000 mL via INTRAVENOUS

## 2018-02-26 MED ORDER — DIPHENHYDRAMINE HCL 50 MG/ML IJ SOLN: 12.5000 mg | Freq: Four times a day (QID) | INTRAMUSCULAR | Status: DC | PRN

## 2018-02-26 MED ORDER — PHENYLEPHRINE HCL 10 MG/ML IJ SOLN
INTRAMUSCULAR | Status: AC
  Filled 2018-02-26: qty 1

## 2018-02-26 MED ORDER — HEPARIN SODIUM (PORCINE) 1000 UNIT/ML IJ SOLN
INTRAMUSCULAR | Status: AC
  Filled 2018-02-26: qty 1

## 2018-02-26 MED ORDER — SUGAMMADEX SODIUM 500 MG/5ML IV SOLN
INTRAVENOUS | Status: AC
  Filled 2018-02-26: qty 5

## 2018-02-26 MED ORDER — MAGNESIUM CITRATE PO SOLN: 1.0000 | Freq: Once | ORAL | Status: DC

## 2018-02-26 MED ORDER — POTASSIUM CHLORIDE IN NACL 20-0.45 MEQ/L-% IV SOLN: INTRAVENOUS | Status: DC

## 2018-02-26 MED ORDER — FENTANYL CITRATE (PF) 100 MCG/2ML IJ SOLN
INTRAMUSCULAR | Status: DC | PRN
  Administered 2018-02-26 (×7): 50 ug via INTRAVENOUS
  Administered 2018-02-26: 100 ug via INTRAVENOUS

## 2018-02-26 MED ORDER — CHLORTHALIDONE 25 MG PO TABS
12.5000 mg | ORAL_TABLET | Freq: Every day | ORAL | Status: DC
  Administered 2018-02-27: 12.5 mg via ORAL
  Filled 2018-02-26 (×2): qty 0.5

## 2018-02-26 MED ORDER — AMLODIPINE BESYLATE 5 MG PO TABS
5.0000 mg | ORAL_TABLET | Freq: Every day | ORAL | Status: DC
  Administered 2018-02-27: 5 mg via ORAL
  Filled 2018-02-26: qty 1

## 2018-02-26 MED ORDER — MORPHINE SULFATE (PF) 2 MG/ML IV SOLN
2.0000 mg | INTRAVENOUS | Status: DC | PRN
  Administered 2018-02-26: 2 mg via INTRAVENOUS
  Administered 2018-02-27: 4 mg via INTRAVENOUS
  Filled 2018-02-26: qty 1
  Filled 2018-02-26: qty 2

## 2018-02-26 MED ORDER — ROCURONIUM BROMIDE 100 MG/10ML IV SOLN
INTRAVENOUS | Status: AC
  Filled 2018-02-26: qty 1

## 2018-02-26 MED ORDER — CEFAZOLIN SODIUM-DEXTROSE 1-4 GM/50ML-% IV SOLN
1.0000 g | Freq: Three times a day (TID) | INTRAVENOUS | Status: AC
  Administered 2018-02-26 – 2018-02-27 (×2): 1 g via INTRAVENOUS
  Filled 2018-02-26 (×2): qty 50

## 2018-02-26 MED ORDER — ACETAMINOPHEN 325 MG PO TABS: 650.0000 mg | ORAL_TABLET | ORAL | Status: DC | PRN

## 2018-02-26 MED ORDER — SODIUM CHLORIDE 0.9 % IR SOLN
Status: DC | PRN
  Administered 2018-02-26: 1000 mL via INTRAVESICAL

## 2018-02-26 MED ORDER — SPIRONOLACTONE 12.5 MG HALF TABLET
12.5000 mg | ORAL_TABLET | Freq: Every day | ORAL | Status: DC
  Administered 2018-02-27: 12.5 mg via ORAL
  Filled 2018-02-26: qty 1

## 2018-02-26 MED ORDER — LIDOCAINE 2% (20 MG/ML) 5 ML SYRINGE
INTRAMUSCULAR | Status: DC | PRN
  Administered 2018-02-26: 100 mg via INTRAVENOUS

## 2018-02-26 MED ORDER — DIPHENHYDRAMINE HCL 12.5 MG/5ML PO ELIX: 12.5000 mg | ORAL_SOLUTION | Freq: Four times a day (QID) | ORAL | Status: DC | PRN

## 2018-02-26 MED ORDER — ONDANSETRON HCL 4 MG/2ML IJ SOLN: 4.0000 mg | Freq: Once | INTRAMUSCULAR | Status: DC | PRN

## 2018-02-26 MED ORDER — BACITRACIN-NEOMYCIN-POLYMYXIN 400-5-5000 EX OINT: 1.0000 "application " | TOPICAL_OINTMENT | Freq: Three times a day (TID) | CUTANEOUS | Status: DC | PRN

## 2018-02-26 MED ORDER — ONDANSETRON HCL 4 MG/2ML IJ SOLN: 4.0000 mg | INTRAMUSCULAR | Status: DC | PRN

## 2018-02-26 MED ORDER — PANTOPRAZOLE SODIUM 40 MG PO TBEC
40.0000 mg | DELAYED_RELEASE_TABLET | Freq: Every day | ORAL | Status: DC
  Administered 2018-02-27: 40 mg via ORAL
  Filled 2018-02-26: qty 1

## 2018-02-26 MED ORDER — BELLADONNA ALKALOIDS-OPIUM 16.2-60 MG RE SUPP
1.0000 | Freq: Four times a day (QID) | RECTAL | Status: DC | PRN
  Administered 2018-02-26 – 2018-02-27 (×2): 1 via RECTAL
  Filled 2018-02-26: qty 1

## 2018-02-26 MED ORDER — ONDANSETRON HCL 4 MG/2ML IJ SOLN
INTRAMUSCULAR | Status: DC | PRN
  Administered 2018-02-26: 4 mg via INTRAVENOUS

## 2018-02-26 MED ORDER — LACTATED RINGERS IV SOLN
INTRAVENOUS | Status: DC | PRN
  Administered 2018-02-26: 11:00:00 via INTRAVENOUS

## 2018-02-26 MED ORDER — ONDANSETRON HCL 4 MG/2ML IJ SOLN
INTRAMUSCULAR | Status: AC
  Filled 2018-02-26: qty 2

## 2018-02-26 MED ORDER — BELLADONNA ALKALOIDS-OPIUM 16.2-60 MG RE SUPP
RECTAL | Status: AC
  Filled 2018-02-26: qty 1

## 2018-02-26 MED ORDER — INSULIN ASPART 100 UNIT/ML ~~LOC~~ SOLN
0.0000 [IU] | SUBCUTANEOUS | Status: DC
  Administered 2018-02-26 – 2018-02-27 (×3): 2 [IU] via SUBCUTANEOUS

## 2018-02-26 MED ORDER — PROPOFOL 10 MG/ML IV BOLUS
INTRAVENOUS | Status: AC
  Filled 2018-02-26: qty 20

## 2018-02-26 SURGICAL SUPPLY — 59 items
APPLICATOR COTTON TIP 6 STRL (MISCELLANEOUS) ×2 IMPLANT
APPLICATOR COTTON TIP 6IN STRL (MISCELLANEOUS) ×4
CATH FOLEY 2WAY SLVR 18FR 30CC (CATHETERS) ×4 IMPLANT
CATH ROBINSON RED A/P 16FR (CATHETERS) ×4 IMPLANT
CATH ROBINSON RED A/P 8FR (CATHETERS) IMPLANT
CATH TIEMANN FOLEY 18FR 5CC (CATHETERS) ×4 IMPLANT
CHLORAPREP W/TINT 26ML (MISCELLANEOUS) ×4 IMPLANT
CLIP VESOLOCK LG 6/CT PURPLE (CLIP) ×12 IMPLANT
COVER SURGICAL LIGHT HANDLE (MISCELLANEOUS) ×4 IMPLANT
COVER TIP SHEARS 8 DVNC (MISCELLANEOUS) ×2 IMPLANT
COVER TIP SHEARS 8MM DA VINCI (MISCELLANEOUS) ×2
COVER WAND RF STERILE (DRAPES) ×4 IMPLANT
CUTTER ECHEON FLEX ENDO 45 340 (ENDOMECHANICALS) ×4 IMPLANT
DECANTER SPIKE VIAL GLASS SM (MISCELLANEOUS) IMPLANT
DERMABOND ADVANCED (GAUZE/BANDAGES/DRESSINGS) ×2
DERMABOND ADVANCED .7 DNX12 (GAUZE/BANDAGES/DRESSINGS) ×2 IMPLANT
DRAPE ARM DVNC X/XI (DISPOSABLE) ×8 IMPLANT
DRAPE COLUMN DVNC XI (DISPOSABLE) ×2 IMPLANT
DRAPE DA VINCI XI ARM (DISPOSABLE) ×8
DRAPE DA VINCI XI COLUMN (DISPOSABLE) ×2
DRAPE SURG IRRIG POUCH 19X23 (DRAPES) ×4 IMPLANT
DRSG TEGADERM 4X4.75 (GAUZE/BANDAGES/DRESSINGS) ×4 IMPLANT
ELECT REM PT RETURN 15FT ADLT (MISCELLANEOUS) ×4 IMPLANT
GLOVE BIO SURGEON STRL SZ 6.5 (GLOVE) ×6 IMPLANT
GLOVE BIO SURGEONS STRL SZ 6.5 (GLOVE) ×2
GLOVE BIOGEL M STRL SZ7.5 (GLOVE) ×8 IMPLANT
GLOVE BIOGEL PI IND STRL 7.0 (GLOVE) ×2 IMPLANT
GLOVE BIOGEL PI INDICATOR 7.0 (GLOVE) ×2
GOWN STRL REUS W/TWL LRG LVL3 (GOWN DISPOSABLE) ×16 IMPLANT
HEMOSTAT SURGICEL 2X3 (HEMOSTASIS) ×4 IMPLANT
HOLDER FOLEY CATH W/STRAP (MISCELLANEOUS) ×4 IMPLANT
IRRIG SUCT STRYKERFLOW 2 WTIP (MISCELLANEOUS) ×4
IRRIGATION SUCT STRKRFLW 2 WTP (MISCELLANEOUS) ×2 IMPLANT
IV LACTATED RINGERS 1000ML (IV SOLUTION) ×4 IMPLANT
NDL SAFETY ECLIPSE 18X1.5 (NEEDLE) ×2 IMPLANT
NEEDLE HYPO 18GX1.5 SHARP (NEEDLE) ×2
PACK ROBOT UROLOGY CUSTOM (CUSTOM PROCEDURE TRAY) ×4 IMPLANT
SCISSORS LAP 5X45 EPIX DISP (ENDOMECHANICALS) ×4 IMPLANT
SEAL CANN UNIV 5-8 DVNC XI (MISCELLANEOUS) ×8 IMPLANT
SEAL XI 5MM-8MM UNIVERSAL (MISCELLANEOUS) ×8
SOLUTION ELECTROLUBE (MISCELLANEOUS) ×4 IMPLANT
STAPLE RELOAD 45 GRN (STAPLE) ×2 IMPLANT
STAPLE RELOAD 45MM GREEN (STAPLE) ×2
SUT ETHILON 3 0 PS 1 (SUTURE) ×4 IMPLANT
SUT MNCRL 3 0 RB1 (SUTURE) ×2 IMPLANT
SUT MNCRL 3 0 VIOLET RB1 (SUTURE) ×2 IMPLANT
SUT MNCRL AB 4-0 PS2 18 (SUTURE) ×8 IMPLANT
SUT MONOCRYL 3 0 RB1 (SUTURE) ×4
SUT VIC AB 0 CT1 27 (SUTURE) ×2
SUT VIC AB 0 CT1 27XBRD ANTBC (SUTURE) ×2 IMPLANT
SUT VIC AB 0 UR5 27 (SUTURE) ×4 IMPLANT
SUT VIC AB 2-0 SH 27 (SUTURE) ×4
SUT VIC AB 2-0 SH 27X BRD (SUTURE) ×4 IMPLANT
SUT VICRYL 0 UR6 27IN ABS (SUTURE) ×8 IMPLANT
SYR 27GX1/2 1ML LL SAFETY (SYRINGE) ×4 IMPLANT
TOWEL OR 17X26 10 PK STRL BLUE (TOWEL DISPOSABLE) ×4 IMPLANT
TOWEL OR NON WOVEN STRL DISP B (DISPOSABLE) ×4 IMPLANT
TUBING INSUFFLATION 10FT LAP (TUBING) IMPLANT
WATER STERILE IRR 1000ML POUR (IV SOLUTION) ×4 IMPLANT

## 2018-02-26 NOTE — Plan of Care (Signed)
  Problem: Pain Management: Goal: General experience of comfort will improve Outcome: Progressing   Problem: Skin Integrity: Goal: Demonstration of wound healing without infection will improve Outcome: Progressing   Problem: Urinary Elimination: Goal: Ability to avoid or minimize complications of infection will improve Outcome: Progressing

## 2018-02-26 NOTE — Transfer of Care (Signed)
Immediate Anesthesia Transfer of Care Note  Patient: Nicholas Carr  Procedure(s) Performed: XI ROBOTIC ASSISTED LAPAROSCOPIC RADICAL PROSTATECTOMY LEVEL 3 (N/A ) LYMPHADENECTOMY, PELVIC (Bilateral )  Patient Location: PACU  Anesthesia Type:General  Level of Consciousness: drowsy and patient cooperative  Airway & Oxygen Therapy: Patient Spontanous Breathing and Patient connected to face mask oxygen  Post-op Assessment: Report given to RN and Post -op Vital signs reviewed and stable  Post vital signs: Reviewed and stable  Last Vitals:  Vitals Value Taken Time  BP 168/93 02/26/2018  4:04 PM  Temp    Pulse 77 02/26/2018  4:08 PM  Resp 17 02/26/2018  4:08 PM  SpO2 100 % 02/26/2018  4:08 PM  Vitals shown include unvalidated device data.  Last Pain:  Vitals:   02/26/18 1005  TempSrc: Oral  PainSc:       Patients Stated Pain Goal: 4 (46/95/07 2257)  Complications: No apparent anesthesia complications

## 2018-02-26 NOTE — Progress Notes (Signed)
Patient ID: Nicholas Carr, male   DOB: 12-05-1960, 57 y.o.   MRN: [id number]  Post-op note  Subjective: The patient is doing well.  No complaints except for bladder spasms.  Objective: Vital signs in last 24 hours: Temp:  [97.5 F (36.4 C)-98.1 F (36.7 C)] 97.5 F (36.4 C) (12/02 1604) Pulse Rate:  [74-79] 79 (12/02 1630) Resp:  [14-22] 22 (12/02 1630) BP: (127-168)/(85-103) 127/103 (12/02 1630) SpO2:  [99 %-100 %] 100 % (12/02 1630) Weight:  [132.5 kg] 132.5 kg (12/02 1001)  Intake/Output from previous day: No intake/output data recorded. Intake/Output this shift: Total I/O In: 3100 [I.V.:2100; IV Piggyback:1000] Out: 250 [Drains:50; Blood:200]  Physical Exam:  General: Alert and oriented. Abdomen: Soft, Nondistended. Incisions: Clean and dry. GU: Urine draining clear.  Lab Results:  Assessment/Plan: POD#0   1) Continue to monitor, ambulate, IS 2) B&O suppository   Roxy Horseman, Brooke Bonito. MD   LOS: 0 days   Dannie Woolen,LES 02/26/2018, 4:42 PM

## 2018-02-26 NOTE — Anesthesia Procedure Notes (Addendum)
Procedure Name: Intubation Date/Time: 02/26/2018 11:18 AM Performed by: West Pugh, CRNA Pre-anesthesia Checklist: Patient identified, Emergency Drugs available, Suction available, Patient being monitored and Timeout performed Patient Re-evaluated:Patient Re-evaluated prior to induction Oxygen Delivery Method: Circle system utilized Preoxygenation: Pre-oxygenation with 100% oxygen Induction Type: IV induction Ventilation: Mask ventilation without difficulty Laryngoscope Size: Mac and 4 Grade View: Grade II Tube type: Oral Tube size: 7.5 mm Number of attempts: 1 Airway Equipment and Method: Stylet Placement Confirmation: ETT inserted through vocal cords under direct vision,  positive ETCO2,  CO2 detector and breath sounds checked- equal and bilateral Secured at: 25 cm Tube secured with: Tape Dental Injury: Teeth and Oropharynx as per pre-operative assessment

## 2018-02-26 NOTE — Anesthesia Preprocedure Evaluation (Signed)
Anesthesia Evaluation  Patient identified by MRN, date of birth, ID band Patient awake    Reviewed: Allergy & Precautions, NPO status , Patient's Chart, lab work & pertinent test results  Airway Mallampati: I  TM Distance: >3 FB Neck ROM: Full    Dental   Pulmonary sleep apnea , former smoker,    Pulmonary exam normal        Cardiovascular hypertension, Pt. on medications Normal cardiovascular exam     Neuro/Psych Anxiety Depression TIA   GI/Hepatic GERD  Medicated and Controlled,  Endo/Other  diabetes, Type 2, Oral Hypoglycemic Agents  Renal/GU Renal hypertensionRenal disease     Musculoskeletal   Abdominal   Peds  Hematology   Anesthesia Other Findings   Reproductive/Obstetrics                             Anesthesia Physical Anesthesia Plan  ASA: II  Anesthesia Plan: General   Post-op Pain Management:    Induction: Intravenous  PONV Risk Score and Plan: 2 and Ondansetron  Airway Management Planned: Oral ETT  Additional Equipment:   Intra-op Plan:   Post-operative Plan: Extubation in OR  Informed Consent: I have reviewed the patients History and Physical, chart, labs and discussed the procedure including the risks, benefits and alternatives for the proposed anesthesia with the patient or authorized representative who has indicated his/her understanding and acceptance.     Plan Discussed with: CRNA and Surgeon  Anesthesia Plan Comments:         Anesthesia Quick Evaluation

## 2018-02-26 NOTE — Anesthesia Postprocedure Evaluation (Signed)
Anesthesia Post Note  Patient: Nicholas Carr  Procedure(s) Performed: XI ROBOTIC ASSISTED LAPAROSCOPIC RADICAL PROSTATECTOMY LEVEL 3 (N/A ) LYMPHADENECTOMY, PELVIC (Bilateral )     Patient location during evaluation: PACU Anesthesia Type: General Level of consciousness: sedated Pain management: pain level controlled Vital Signs Assessment: post-procedure vital signs reviewed and stable Respiratory status: spontaneous breathing and respiratory function stable Cardiovascular status: stable Postop Assessment: no apparent nausea or vomiting Anesthetic complications: no    Last Vitals:  Vitals:   02/26/18 1645 02/26/18 1700  BP: (!) 146/93 (!) 157/87  Pulse: 80 80  Resp: (!) 24 12  Temp:  36.6 C  SpO2: 99% 100%    Last Pain:  Vitals:   02/26/18 1700  TempSrc:   PainSc: 4                  Audia Amick DANIEL

## 2018-02-26 NOTE — Discharge Instructions (Signed)

## 2018-02-26 NOTE — Op Note (Signed)
Preoperative diagnosis: Clinically localized adenocarcinoma of the prostate (clinical stage T1c N0-1 M0)  Postoperative diagnosis: Clinically localized adenocarcinoma of the prostate (clinical stage T1c N0-1 M0)  Procedure:  1. Robotic assisted laparoscopic radical prostatectomy (bilateral nerve sparing) 2. Bilateral robotic assisted laparoscopic pelvic lymphadenectomy 3. Laparoscopic adhesiolysis  Surgeon: Pryor Curia. M.D.  Assistant: Debbrah Alar, PA-C  An assistant was required for this surgical procedure.  The duties of the assistant included but were not limited to suctioning, passing suture, camera manipulation, retraction. This procedure would not be able to be performed without an Environmental consultant.  Resident: Dr. Arminda Resides  Anesthesia: General  Complications: None  EBL: 200 mL  IVF:  2000 mL crystalloid  Specimens: 1. Prostate and seminal vesicles 2. Right pelvic lymph nodes 3. Left pelvic lymph nodes  Disposition of specimens: Pathology  Drains: 1. 20 Fr coude catheter 2. # 19 Blake pelvic drain  Indication: Nicholas Carr is a 57 y.o. year old patient with clinically localized prostate cancer.  After a thorough review of the management options for treatment of prostate cancer, he elected to proceed with surgical therapy and the above procedure(s).  We have discussed the potential benefits and risks of the procedure, side effects of the proposed treatment, the likelihood of the patient achieving the goals of the procedure, and any potential problems that might occur during the procedure or recuperation. Informed consent has been obtained.  Description of procedure:  The patient was taken to the operating room and a general anesthetic was administered. He was given preoperative antibiotics, placed in the dorsal lithotomy position, and prepped and draped in the usual sterile fashion. Next a preoperative timeout was performed. A urethral catheter was placed into  the bladder.  The patient's previous midline laparotomy scar was noted and a site was selected to the left of the umbilicus away from his prior incision for placement of the camera port. This was placed using a standard open Hassan technique which allowed entry into the peritoneal cavity under direct vision and without difficulty. An 8 mm robotic port was placed and a pneumoperitoneum established. The camera was then used to inspect the abdomen and there was no evidence of any intra-abdominal injuries or other abnormalities aside from numerous omental adhesions.  The remaining abdominal ports were then placed after laparoscopic scissors were used to take down extensive adhesions to make room for placement of the ports. 8 mm robotic ports were placed first in the left lower quadrant, and far left lateral abdominal wall. The adhesiolysis then commenced and took approximately 45 minutes (about 25% of the operative time).  An 8 mm port was placed in the right lower quadrant.  A 5 mm port was placed in the right upper quadrant and a 12 mm port was placed in the right lateral abdominal wall for laparoscopic assistance. All ports were placed under direct vision without difficulty. The surgical cart was then docked.   Utilizing the cautery scissors, the bladder was reflected posteriorly allowing entry into the space of Retzius and identification of the endopelvic fascia and prostate. The periprostatic fat was then removed from the prostate allowing full exposure of the endopelvic fascia. The endopelvic fascia was then incised from the apex back to the base of the prostate bilaterally and the underlying levator muscle fibers were swept laterally off the prostate thereby isolating the dorsal venous complex. The dorsal vein was then stapled and divided with a 45 mm Flex Echelon stapler. Attention then turned to the bladder  neck which was divided anteriorly thereby allowing entry into the bladder and exposure of the urethral  catheter. The catheter balloon was deflated and the catheter was brought into the operative field and used to retract the prostate anteriorly. The posterior bladder neck was then examined and was divided allowing further dissection between the bladder and prostate posteriorly until the vasa deferentia and seminal vessels were identified. The vasa deferentia were isolated, divided, and lifted anteriorly. The seminal vesicles were dissected down to their tips with care to control the seminal vascular arterial blood supply. These structures were then lifted anteriorly and the space between Denonvillier's fascia and the anterior rectum was developed with a combination of sharp and blunt dissection. This isolated the vascular pedicles of the prostate.  The lateral prostatic fascia was then sharply incised allowing release of the neurovascular bundles bilaterally. The vascular pedicles of the prostate were then ligated with Weck clips between the prostate and neurovascular bundles and divided with sharp cold scissor dissection resulting in neurovascular bundle preservation. The neurovascular bundles were then separated off the apex of the prostate and urethra bilaterally.  The urethra was then sharply transected allowing the prostate specimen to be disarticulated. The pelvis was copiously irrigated and hemostasis was ensured. There was no evidence for rectal injury.  Attention then turned to the right pelvic sidewall. The fibrofatty tissue between the external iliac vein, confluence of the iliac vessels, hypogastric artery, and Cooper's ligament was dissected free from the pelvic sidewall with care to preserve the obturator nerve. Weck clips were used for lymphostasis and hemostasis. An identical procedure was performed on the contralateral side and the lymphatic packets were removed for permanent pathologic analysis.  Attention then turned to the urethral anastomosis. A 2-0 Vicryl slip knot was placed between  Denonvillier's fascia, the posterior bladder neck, and the posterior urethra to reapproximate these structures. A double-armed 3-0 Monocryl suture was then used to perform a 360 running tension-free anastomosis between the bladder neck and urethra. A new urethral catheter was then placed into the bladder and irrigated. There were no blood clots within the bladder and the anastomosis appeared to be watertight. A #19 Blake drain was then brought through the left lateral 8 mm port site and positioned appropriately within the pelvis. It was secured to the skin with a nylon suture. The surgical cart was then undocked. The right lateral 12 mm port site was closed at the fascial level with a 0 Vicryl suture placed laparoscopically. All remaining ports were then removed under direct vision. The prostate specimen was removed intact within the Endopouch retrieval bag via the periumbilical camera port site. This fascial opening was closed with two running 0 Vicryl sutures. 0.25% Marcaine was then injected into all port sites and all incisions were reapproximated at the skin level with 4-0 Monocryl subcuticular sutures and Dermabond. The patient appeared to tolerate the procedure well and without complications. The patient was able to be extubated and transferred to the recovery unit in satisfactory condition.   Pryor Curia MD

## 2018-02-27 ENCOUNTER — Encounter (HOSPITAL_COMMUNITY): Payer: Self-pay | Admitting: Urology

## 2018-02-27 DIAGNOSIS — C61 Malignant neoplasm of prostate: Secondary | ICD-10-CM | POA: Diagnosis not present

## 2018-02-27 LAB — GLUCOSE, CAPILLARY
GLUCOSE-CAPILLARY: 77 mg/dL (ref 70–99)
Glucose-Capillary: 115 mg/dL — ABNORMAL HIGH (ref 70–99)
Glucose-Capillary: 123 mg/dL — ABNORMAL HIGH (ref 70–99)
Glucose-Capillary: 129 mg/dL — ABNORMAL HIGH (ref 70–99)

## 2018-02-27 LAB — HEMOGLOBIN AND HEMATOCRIT, BLOOD
HCT: 35.3 % — ABNORMAL LOW (ref 39.0–52.0)
HCT: 34.6 % — ABNORMAL LOW (ref 39.0–52.0)
Hemoglobin: 11 g/dL — ABNORMAL LOW (ref 13.0–17.0)
Hemoglobin: 10.8 g/dL — ABNORMAL LOW (ref 13.0–17.0)

## 2018-02-27 LAB — BASIC METABOLIC PANEL
Anion gap: 10 (ref 5–15)
BUN: 21 mg/dL — ABNORMAL HIGH (ref 6–20)
CALCIUM: 8 mg/dL — AB (ref 8.9–10.3)
CO2: 21 mmol/L — AB (ref 22–32)
Chloride: 103 mmol/L (ref 98–111)
Creatinine, Ser: 1.72 mg/dL — ABNORMAL HIGH (ref 0.61–1.24)
GFR calc non Af Amer: 44 mL/min — ABNORMAL LOW (ref >60)
GFR, EST AFRICAN AMERICAN: 51 mL/min — AB (ref >60)
Glucose, Bld: 116 mg/dL — ABNORMAL HIGH (ref 70–99)
Potassium: 3.7 mmol/L (ref 3.5–5.1)
Sodium: 134 mmol/L — ABNORMAL LOW (ref 135–145)

## 2018-02-27 MED ORDER — BISACODYL 10 MG RE SUPP
10.0000 mg | Freq: Once | RECTAL | Status: AC
  Administered 2018-02-27: 10 mg via RECTAL
  Filled 2018-02-27: qty 1

## 2018-02-27 MED ORDER — TRAMADOL HCL 50 MG PO TABS
50.0000 mg | ORAL_TABLET | Freq: Four times a day (QID) | ORAL | Status: DC | PRN
  Administered 2018-02-27: 100 mg via ORAL
  Filled 2018-02-27: qty 2

## 2018-02-27 MED FILL — SULFAMETHOXAZOLE-TMP DS TAB: 800-160 | 3 days supply | Qty: 6 | Fill #0

## 2018-02-27 MED FILL — traMADol HCL 50 MG TABS: 50 | 4 days supply | Qty: 30 | Fill #0

## 2018-02-27 NOTE — Discharge Summary (Signed)
Date of admission: 02/26/2018  Date of discharge: 02/27/2018  Admission diagnosis: Prostate Cancer  Discharge diagnosis: Prostate Cancer  History and Physical: For full details, please see admission history and physical. Briefly, Nicholas Carr is a 57 y.o. gentleman with localized prostate cancer.  After discussing management/treatment options, he elected to proceed with surgical treatment.  Hospital Course: Nicholas Carr was taken to the operating room on 02/26/2018 and underwent a robotic assisted laparoscopic radical prostatectomy. He tolerated this procedure well and without complications. Postoperatively, he was able to be transferred to a regular hospital room following recovery from anesthesia.  He was able to begin ambulating the night of surgery. He remained hemodynamically stable overnight.  He had excellent urine output with appropriately minimal output from his pelvic drain and his pelvic drain was removed on POD #1.  He was transitioned to oral pain medication, tolerated a clear liquid diet, and had met all discharge criteria and was able to be discharged home later on POD#1.  Laboratory values:  Recent Labs    02/26/18 1630 02/27/18 0415 02/27/18 1115  HGB 14.0 11.0* 10.8*  HCT 44.4 35.3* 34.6*    Disposition: Home  Discharge instruction: He was instructed to be ambulatory but to refrain from heavy lifting, strenuous activity, or driving. He was instructed on urethral catheter care.  Discharge medications:   Allergies as of 02/27/2018      Reactions   Boniva [ibandronic Acid] Other (See Comments)   achy   Lorazepam Other (See Comments)   Deep sleep   Nortriptyline Other (See Comments)   A very deep sleep   Wellbutrin [bupropion] Other (See Comments)   nightmares      Medication List    STOP taking these medications   alfuzosin 10 MG 24 hr tablet Commonly known as:  UROXATRAL   sildenafil 100 MG tablet Commonly known as:  VIAGRA     TAKE these medications    ACCU-CHEK FASTCLIX LANCETS Misc CHECK BLOOD SUGAR TWICE A DAY   ACCU-CHEK GUIDE test strip Generic drug:  glucose blood USE TO CHECK BLOOD SUGARS TWICE A DAY.   amLODipine 5 MG tablet Commonly known as:  NORVASC Take 1 tablet (5 mg total) by mouth daily.   candesartan 32 MG tablet Commonly known as:  ATACAND Take 1 tablet (32 mg total) by mouth daily. What changed:  how much to take   carvedilol 25 MG tablet Commonly known as:  COREG Take 0.5 tablets (12.5 mg total) by mouth daily.   chlorthalidone 25 MG tablet Commonly known as:  HYGROTON Take 12.5 mg by mouth daily.   FREESTYLE LITE Devi As directed   hydrALAZINE 50 MG tablet Commonly known as:  APRESOLINE Take 1 tablet (50 mg total) by mouth 3 (three) times daily as needed (for systolic BP >875).   JANUVIA 100 MG tablet Generic drug:  sitaGLIPtin TAKE 1 TABLET BY MOUTH DAILY.   lovastatin 40 MG tablet Commonly known as:  MEVACOR TAKE 1 TABLET BY MOUTH AT BEDTIME   metFORMIN 500 MG 24 hr tablet Commonly known as:  GLUCOPHAGE-XR TAKE 2 TABLETS BY MOUTH DAILY WITH BREAKFAST. What changed:  See the new instructions.   pantoprazole 40 MG tablet Commonly known as:  PROTONIX TAKE 1 TABLET (40 MG TOTAL) BY MOUTH DAILY.   pioglitazone 30 MG tablet Commonly known as:  ACTOS Take 1 tablet (30 mg total) by mouth daily as needed. cbg >140   spironolactone 25 MG tablet Commonly known as:  ALDACTONE Take  0.5 tablets (12.5 mg total) by mouth daily. In am   sulfamethoxazole-trimethoprim 800-160 MG tablet Commonly known as:  BACTRIM DS,SEPTRA DS Take 1 tablet by mouth 2 (two) times daily. Start the day prior to foley removal appointment   traMADol 50 MG tablet Commonly known as:  ULTRAM Take 1-2 tablets (50-100 mg total) by mouth every 6 (six) hours as needed for moderate pain or severe pain.       Followup: He will followup in 1 week for catheter removal and to discuss his surgical pathology results.

## 2018-02-27 NOTE — Progress Notes (Signed)
Patient ID: Nicholas Carr, male   DOB: [birth year], 57 y.o.   MRN: [id number]  1 Day Post-Op Subjective: The patient is doing well.  No nausea or vomiting this morning. Pain is adequately controlled.  Objective: Vital signs in last 24 hours: Temp:  [97.5 F (36.4 C)-98.6 F (37 C)] 98.6 F (37 C) (12/03 0436) Pulse Rate:  [72-82] 72 (12/03 0436) Resp:  [12-24] 17 (12/03 0436) BP: (117-168)/(72-103) 117/72 (12/03 0436) SpO2:  [94 %-100 %] 94 % (12/03 0436) Weight:  [130.6 kg-132.5 kg] 130.6 kg (12/02 1720)  Intake/Output from previous day: 12/02 0701 - 12/03 0700 In: 4991.4 [I.V.:3891.4; IV Piggyback:1100] Out: 889 [Urine:475; Drains:214; Blood:200] Intake/Output this shift: No intake/output data recorded.  Physical Exam:  General: Alert and oriented. CV: RRR Lungs: Clear bilaterally. GI: Soft, Nondistended. Incisions: Clean, dry, and intact Urine: Clear  Extremities: Nontender, no erythema, no edema.  Lab Results: Recent Labs    02/26/18 1630 02/27/18 0415  HGB 14.0 11.0*  HCT 44.4 35.3*      Assessment/Plan: POD# 1 s/p robotic prostatectomy.  1) Continue IVF although UOP improving 2) Ambulate, Incentive spirometry 3) Transition to oral pain medication 4) Dulcolax suppository 5) D/C pelvic drain 6) Recheck H/H later this morning 7) Plan for likely discharge later today   Pryor Curia. MD   LOS: 0 days   Chatara Lucente,LES 02/27/2018, 7:07 AM

## 2018-03-01 ENCOUNTER — Telehealth: Payer: Self-pay | Admitting: Internal Medicine

## 2018-03-01 NOTE — Telephone Encounter (Signed)
First attempt made for TCM call to patient left message to call office.

## 2018-03-01 NOTE — Telephone Encounter (Signed)
Transition Care Management Follow-up Telephone Call  How have you been since you were released from the hospital? Feeling well just sore.   Do you understand why you were in the hospital? yes   Do you understand the discharge instrcutions? yes  Items Reviewed:  Medications reviewed: yes  Allergies reviewed: yes  Dietary changes reviewed: yes  Referrals reviewed: yes   Functional Questionnaire:   Activities of Daily Living (ADLs):   He states they are independent in the following: ambulation, bathing and hygiene, feeding, continence, grooming, toileting and dressing States they require assistance with the following: Patient has Catheter in place.    Any transportation issues/concerns?: no   Any patient concerns? no   Confirmed importance and date/time of follow-up visits scheduled: yes   Confirmed with patient if condition begins to worsen call PCP or go to the ER.  Patient was given the Call-a-Nurse line [phone]: yes

## 2018-03-07 ENCOUNTER — Encounter: Payer: Self-pay | Admitting: Internal Medicine

## 2018-03-09 ENCOUNTER — Inpatient Hospital Stay: Payer: No Typology Code available for payment source | Admitting: Internal Medicine

## 2018-03-13 ENCOUNTER — Other Ambulatory Visit: Payer: Self-pay

## 2018-03-13 DIAGNOSIS — I10 Essential (primary) hypertension: Secondary | ICD-10-CM

## 2018-03-13 MED ORDER — AMLODIPINE BESYLATE 5 MG PO TABS: 5.0000 mg | ORAL_TABLET | Freq: Every day | ORAL | 1 refills | Status: DC

## 2018-03-23 ENCOUNTER — Encounter: Payer: Self-pay | Admitting: Internal Medicine

## 2018-03-23 ENCOUNTER — Ambulatory Visit (INDEPENDENT_AMBULATORY_CARE_PROVIDER_SITE_OTHER): Payer: No Typology Code available for payment source | Admitting: Internal Medicine

## 2018-03-23 ENCOUNTER — Ambulatory Visit (INDEPENDENT_AMBULATORY_CARE_PROVIDER_SITE_OTHER): Payer: No Typology Code available for payment source

## 2018-03-23 VITALS — BP 128/78 | HR 87 | Temp 97.5°F | Ht 75.0 in | Wt 287.0 lb

## 2018-03-23 DIAGNOSIS — D649 Anemia, unspecified: Secondary | ICD-10-CM

## 2018-03-23 DIAGNOSIS — B353 Tinea pedis: Secondary | ICD-10-CM | POA: Insufficient documentation

## 2018-03-23 DIAGNOSIS — C61 Malignant neoplasm of prostate: Secondary | ICD-10-CM

## 2018-03-23 DIAGNOSIS — G8929 Other chronic pain: Secondary | ICD-10-CM | POA: Insufficient documentation

## 2018-03-23 DIAGNOSIS — M545 Low back pain: Secondary | ICD-10-CM

## 2018-03-23 DIAGNOSIS — I10 Essential (primary) hypertension: Secondary | ICD-10-CM

## 2018-03-23 DIAGNOSIS — K219 Gastro-esophageal reflux disease without esophagitis: Secondary | ICD-10-CM

## 2018-03-23 DIAGNOSIS — N5231 Erectile dysfunction following radical prostatectomy: Secondary | ICD-10-CM | POA: Insufficient documentation

## 2018-03-23 DIAGNOSIS — E119 Type 2 diabetes mellitus without complications: Secondary | ICD-10-CM

## 2018-03-23 DIAGNOSIS — N183 Chronic kidney disease, stage 3 unspecified: Secondary | ICD-10-CM

## 2018-03-23 DIAGNOSIS — R300 Dysuria: Secondary | ICD-10-CM

## 2018-03-23 DIAGNOSIS — E559 Vitamin D deficiency, unspecified: Secondary | ICD-10-CM

## 2018-03-23 DIAGNOSIS — E782 Mixed hyperlipidemia: Secondary | ICD-10-CM

## 2018-03-23 DIAGNOSIS — Z1329 Encounter for screening for other suspected endocrine disorder: Secondary | ICD-10-CM

## 2018-03-23 DIAGNOSIS — G4733 Obstructive sleep apnea (adult) (pediatric): Secondary | ICD-10-CM

## 2018-03-23 MED ORDER — SILDENAFIL CITRATE 100 MG PO TABS: 100.0000 mg | ORAL_TABLET | Freq: Every day | ORAL | 0 refills | Status: DC | PRN

## 2018-03-23 MED ORDER — CANDESARTAN CILEXETIL 32 MG PO TABS: 16.0000 mg | ORAL_TABLET | Freq: Every day | ORAL | Status: DC

## 2018-03-23 MED ORDER — METFORMIN HCL ER 500 MG PO TB24: 1000.0000 mg | ORAL_TABLET | Freq: Every day | ORAL | Status: DC
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
## 2018-03-23 NOTE — Patient Instructions (Addendum)
Add back multivitamin with iron (iron ferrous sulfate/gluconate at least 65 mg/325 Fe daily)    Lotriman or Lamisil cream 1% as needed over the counter  Zeasorb AF powder otc antifungal powder to keep you dry   Athlete's Foot  Athlete's foot (tinea pedis) is a fungal infection of the skin on your feet. It often occurs on the skin that is between or underneath the toes. It can also occur on the soles of your feet. The infection can spread from person to person (is contagious). It can also spread when a person's bare feet come in contact with the fungus on shower floors or on items such as shoes. What are the causes? This condition is caused by a fungus that grows in warm, moist places. You can get athlete's foot by sharing shoes, shower stalls, towels, and wet floors with someone who is infected. Not washing your feet or changing your socks often enough can also lead to athlete's foot. What increases the risk? This condition is more likely to develop in:  Men.  People who have a weak body defense system (immune system).  People who have diabetes.  People who use public showers, such as at a gym.  People who wear heavy-duty shoes, such as Environmental manager.  Seasons with warm, humid weather. What are the signs or symptoms? Symptoms of this condition include:  Itchy areas between your toes or on the soles of your feet.  White, flaky, or scaly areas between your toes or on the soles of your feet.  Very itchy small blisters between your toes or on the soles of your feet.  Small cuts in your skin. These cuts can become infected.  Thick or discolored toenails. How is this diagnosed? This condition may be diagnosed with a physical exam and a review of your medical history. Your health care provider may also take a skin or toenail sample to examine under a microscope. How is this treated? This condition is treated with antifungal medicines. These may be applied as powders,  ointments, or creams. In severe cases, an oral antifungal medicine may be given. Follow these instructions at home: Medicines  Apply or take over-the-counter and prescription medicines only as told by your health care provider.  Apply your antifungal medicine as told by your health care provider. Do not stop using the antifungal even if your condition improves. Foot care  Do not scratch your feet.  Keep your feet dry: ? Wear cotton or wool socks. Change your socks every day or if they become wet. ? Wear shoes that allow air to flow, such as sandals or canvas tennis shoes.  Wash and dry your feet, including the area between your toes. Also, wash and dry your feet: ? Every day or as told by your health care provider. ? After exercising. General instructions  Do not let others use towels, shoes, nail clippers, or other personal items that touch your feet.  Protect your feet by wearing sandals in wet areas, such as locker rooms and shared showers.  Keep all follow-up visits as told by your health care provider. This is important.  If you have diabetes, keep your blood sugar under control. Contact a health care provider if:  You have a fever.  You have swelling, soreness, warmth, or redness in your foot.  Your feet are not getting better with treatment.  Your symptoms get worse.  You have new symptoms. Summary  Athlete's foot (tinea pedis) is a fungal infection of  the skin on your feet. It often occurs on skin that is between or underneath the toes.  This condition is caused by a fungus that grows in warm, moist places.  Symptoms include white, flaky, or scaly areas between your toes or on the soles of your feet.  This condition is treated with antifungal medicines.  Keep your feet clean. Always dry them thoroughly. This information is not intended to replace advice given to you by your health care provider. Make sure you discuss any questions you have with your health  care provider. Document Released: 03/11/2000 Document Revised: 01/02/2017 Document Reviewed: 01/02/2017 Elsevier Interactive Patient Education  2019 [employer].

## 2018-03-23 NOTE — Progress Notes (Signed)
Pre visit review using our clinic review tool, if applicable. No additional management support is needed unless otherwise documented below in the visit note. 

## 2018-03-25 LAB — URINALYSIS, ROUTINE W REFLEX MICROSCOPIC
Bacteria, UA: NONE SEEN /HPF
Bilirubin Urine: NEGATIVE
Crystals: NONE SEEN /HPF
Glucose, UA: NEGATIVE
Hyaline Cast: NONE SEEN /LPF
Ketones, ur: NEGATIVE
Nitrite: NEGATIVE
Protein, ur: NEGATIVE
Specific Gravity, Urine: 1.023 (ref 1.001–1.03)
Squamous Epithelial / HPF: NONE SEEN /HPF (ref <=5)
pH: <=5 (ref 5.0–8.0)

## 2018-03-25 LAB — URINE CULTURE
MICRO NUMBER:: 91544809
Result:: NO GROWTH
SPECIMEN QUALITY:: ADEQUATE

## 2018-03-25 LAB — MICROSCOPIC MESSAGE

## 2018-04-30 ENCOUNTER — Telehealth: Payer: Self-pay | Admitting: Gastroenterology

## 2018-04-30 NOTE — Telephone Encounter (Signed)
Hi Dr. Ardis Hughs  The patients wife Monique Gift advised that she spoke to you and would like for you to be her Nicholas Carr (the patient) Doctor.  Dr. Sharlett Iles was his Doctor. Please advise if its okay to schedule a colonoscope.

## 2018-05-02 NOTE — Telephone Encounter (Signed)
Absolutely, happy to see him.  I reviewd 2015 colonoscopy with Dr. Sharlett Iles, single subCM adenoma. He needs surveillance colonoscopy at his soonest convenience.

## 2018-05-15 ENCOUNTER — Encounter: Payer: Self-pay | Admitting: Gastroenterology

## 2018-05-15 NOTE — Telephone Encounter (Signed)
Pt scheduled for colon on 06/13/18 at 9:30am.

## 2018-05-26 ENCOUNTER — Encounter: Payer: Self-pay | Admitting: Gastroenterology

## 2018-06-04 ENCOUNTER — Other Ambulatory Visit: Payer: Self-pay | Admitting: Internal Medicine

## 2018-06-05 ENCOUNTER — Other Ambulatory Visit: Payer: Self-pay

## 2018-06-06 ENCOUNTER — Other Ambulatory Visit: Payer: Self-pay

## 2018-06-06 MED ORDER — PANTOPRAZOLE SODIUM 40 MG PO TBEC: 40.0000 mg | DELAYED_RELEASE_TABLET | Freq: Every day | ORAL | 3 refills | Status: DC

## 2018-06-13 ENCOUNTER — Encounter: Payer: No Typology Code available for payment source | Admitting: Gastroenterology

## 2018-06-15 ENCOUNTER — Other Ambulatory Visit (INDEPENDENT_AMBULATORY_CARE_PROVIDER_SITE_OTHER): Payer: No Typology Code available for payment source

## 2018-06-15 ENCOUNTER — Other Ambulatory Visit: Payer: Self-pay

## 2018-06-15 ENCOUNTER — Other Ambulatory Visit: Payer: Self-pay | Admitting: Internal Medicine

## 2018-06-15 DIAGNOSIS — E119 Type 2 diabetes mellitus without complications: Secondary | ICD-10-CM

## 2018-06-15 DIAGNOSIS — Z1329 Encounter for screening for other suspected endocrine disorder: Secondary | ICD-10-CM | POA: Diagnosis not present

## 2018-06-15 DIAGNOSIS — I10 Essential (primary) hypertension: Secondary | ICD-10-CM | POA: Diagnosis not present

## 2018-06-15 DIAGNOSIS — E559 Vitamin D deficiency, unspecified: Secondary | ICD-10-CM

## 2018-06-15 DIAGNOSIS — R7989 Other specified abnormal findings of blood chemistry: Secondary | ICD-10-CM | POA: Diagnosis not present

## 2018-06-15 DIAGNOSIS — D649 Anemia, unspecified: Secondary | ICD-10-CM

## 2018-06-15 LAB — COMPREHENSIVE METABOLIC PANEL
ALT: 27 U/L (ref 0–53)
AST: 19 U/L (ref 0–37)
Albumin: 4.3 g/dL (ref 3.5–5.2)
Alkaline Phosphatase: 77 U/L (ref 39–117)
BUN: 27 mg/dL — ABNORMAL HIGH (ref 6–23)
CO2: 26 mEq/L (ref 19–32)
Calcium: 9.9 mg/dL (ref 8.4–10.5)
Chloride: 103 mEq/L (ref 96–112)
Creatinine, Ser: 1.49 mg/dL (ref 0.40–1.50)
GFR: 59.13 mL/min — ABNORMAL LOW (ref >60.00)
GLUCOSE: 120 mg/dL — AB (ref 70–99)
Potassium: 4.1 mEq/L (ref 3.5–5.1)
Sodium: 139 mEq/L (ref 135–145)
Total Bilirubin: 0.5 mg/dL (ref 0.2–1.2)
Total Protein: 7.9 g/dL (ref 6.0–8.3)

## 2018-06-15 LAB — CBC WITH DIFFERENTIAL/PLATELET
BASOS ABS: 0 10*3/uL (ref 0.0–0.1)
Basophils Relative: 0.7 % (ref 0.0–3.0)
Eosinophils Absolute: 0.1 10*3/uL (ref 0.0–0.7)
Eosinophils Relative: 1.8 % (ref 0.0–5.0)
HCT: 44.2 % (ref 39.0–52.0)
Hemoglobin: 14.4 g/dL (ref 13.0–17.0)
Lymphocytes Relative: 37.9 % (ref 12.0–46.0)
Lymphs Abs: 2.3 10*3/uL (ref 0.7–4.0)
MCHC: 32.6 g/dL (ref 30.0–36.0)
MCV: 73.9 fl — ABNORMAL LOW (ref 78.0–100.0)
MONOS PCT: 9.1 % (ref 3.0–12.0)
Monocytes Absolute: 0.6 10*3/uL (ref 0.1–1.0)
Neutro Abs: 3.1 10*3/uL (ref 1.4–7.7)
Neutrophils Relative %: 50.5 % (ref 43.0–77.0)
Platelets: 237 10*3/uL (ref 150.0–400.0)
RBC: 5.99 Mil/uL — ABNORMAL HIGH (ref 4.22–5.81)
RDW: 15.8 % — ABNORMAL HIGH (ref 11.5–15.5)
WBC: 6.1 10*3/uL (ref 4.0–10.5)

## 2018-06-15 LAB — HEMOGLOBIN A1C: HEMOGLOBIN A1C: 5.8 % (ref 4.6–6.5)

## 2018-06-15 LAB — LIPID PANEL
CHOL/HDL RATIO: 4
Cholesterol: 142 mg/dL (ref 0–200)
HDL: 36 mg/dL — ABNORMAL LOW (ref >39.00)
LDL Cholesterol: 70 mg/dL (ref 0–99)
NonHDL: 106.46
Triglycerides: 184 mg/dL — ABNORMAL HIGH (ref 0.0–149.0)
VLDL: 36.8 mg/dL (ref 0.0–40.0)

## 2018-06-15 LAB — VITAMIN D 25 HYDROXY (VIT D DEFICIENCY, FRACTURES): VITD: 44.19 ng/mL (ref 30.00–100.00)

## 2018-06-15 LAB — TSH: TSH: 5.52 u[IU]/mL — ABNORMAL HIGH (ref 0.35–4.50)

## 2018-06-15 LAB — T3, FREE: T3 FREE: 3.9 pg/mL (ref 2.3–4.2)

## 2018-06-15 LAB — T4, FREE: Free T4: 0.89 ng/dL (ref 0.60–1.60)

## 2018-06-15 NOTE — Addendum Note (Signed)
Addended by: Arby Barrette on: 06/15/2018 08:12 AM   Modules accepted: Orders

## 2018-06-16 LAB — IRON,TIBC AND FERRITIN PANEL
%SAT: 25 % (calc) (ref 20–48)
Ferritin: 146 ng/mL (ref 38–380)
Iron: 80 ug/dL (ref 50–180)
TIBC: 317 ug/dL (ref 250–425)

## 2018-06-16 LAB — URINALYSIS, ROUTINE W REFLEX MICROSCOPIC
Bilirubin, UA: NEGATIVE
Glucose, UA: NEGATIVE
Ketones, UA: NEGATIVE
LEUKOCYTES UA: NEGATIVE
Nitrite, UA: NEGATIVE
PROTEIN UA: NEGATIVE
RBC, UA: NEGATIVE
SPEC GRAV UA: 1.023 (ref 1.005–1.030)
Urobilinogen, Ur: 1 mg/dL (ref 0.2–1.0)
pH, UA: 5.5 (ref 5.0–7.5)

## 2018-06-16 LAB — MICROALBUMIN / CREATININE URINE RATIO
Creatinine, Urine: 207.8 mg/dL
Microalb/Creat Ratio: 6 mg/g creat (ref 0–29)
Microalbumin, Urine: 13.4 ug/mL

## 2018-06-22 ENCOUNTER — Ambulatory Visit: Payer: No Typology Code available for payment source | Admitting: Internal Medicine

## 2018-06-28 ENCOUNTER — Ambulatory Visit: Payer: No Typology Code available for payment source | Admitting: Internal Medicine

## 2018-07-02 ENCOUNTER — Other Ambulatory Visit: Payer: Self-pay | Admitting: Internal Medicine

## 2018-07-02 ENCOUNTER — Other Ambulatory Visit: Payer: Self-pay

## 2018-07-02 DIAGNOSIS — E119 Type 2 diabetes mellitus without complications: Secondary | ICD-10-CM

## 2018-07-02 MED ORDER — PANTOPRAZOLE SODIUM 40 MG PO TBEC: 40.0000 mg | DELAYED_RELEASE_TABLET | Freq: Every day | ORAL | 1 refills | Status: DC

## 2018-07-02 MED ORDER — METFORMIN HCL ER 500 MG PO TB24: 1000.0000 mg | ORAL_TABLET | Freq: Every day | ORAL | 1 refills | Status: DC

## 2018-07-02 MED ORDER — METFORMIN HCL ER 500 MG PO TB24: 1000.0000 mg | ORAL_TABLET | Freq: Every day | ORAL | Status: DC

## 2018-08-01 ENCOUNTER — Other Ambulatory Visit: Payer: Self-pay

## 2018-08-01 ENCOUNTER — Encounter: Payer: Self-pay | Admitting: Internal Medicine

## 2018-08-01 ENCOUNTER — Ambulatory Visit (INDEPENDENT_AMBULATORY_CARE_PROVIDER_SITE_OTHER): Payer: No Typology Code available for payment source | Admitting: Internal Medicine

## 2018-08-01 DIAGNOSIS — I10 Essential (primary) hypertension: Secondary | ICD-10-CM

## 2018-08-01 DIAGNOSIS — G8929 Other chronic pain: Secondary | ICD-10-CM | POA: Diagnosis not present

## 2018-08-01 DIAGNOSIS — M1990 Unspecified osteoarthritis, unspecified site: Secondary | ICD-10-CM | POA: Diagnosis not present

## 2018-08-01 DIAGNOSIS — M545 Low back pain, unspecified: Secondary | ICD-10-CM

## 2018-08-01 DIAGNOSIS — M25511 Pain in right shoulder: Secondary | ICD-10-CM

## 2018-08-01 MED ORDER — DICLOFENAC SODIUM 1 % TD GEL: 2.0000 g | Freq: Four times a day (QID) | TRANSDERMAL | 2 refills | Status: DC

## 2018-08-01 MED ORDER — TRAMADOL HCL 50 MG PO TABS: 50.0000 mg | ORAL_TABLET | Freq: Two times a day (BID) | ORAL | 0 refills | Status: DC | PRN

## 2018-08-01 NOTE — Patient Instructions (Addendum)
If not better we need to refer you for Xray, physical therapy and orthopedics this is likely rotator cuff and TENS may not help no evidence it will help shoulder may help your back    Back Exercises The following exercises strengthen the muscles that help to support the back. They also help to keep the lower back flexible. Doing these exercises can help to prevent back pain or lessen existing pain. If you have back pain or discomfort, try doing these exercises 2-3 times each day or as told by your health care provider. When the pain goes away, do them once each day, but increase the number of times that you repeat the steps for each exercise (do more repetitions). If you do not have back pain or discomfort, do these exercises once each day or as told by your health care provider. Exercises Single Knee to Chest Repeat these steps 3-5 times for each leg: 1. Lie on your back on a firm bed or the floor with your legs extended. 2. Bring one knee to your chest. Your other leg should stay extended and in contact with the floor. 3. Hold your knee in place by grabbing your knee or thigh. 4. Pull on your knee until you feel a gentle stretch in your lower back. 5. Hold the stretch for 10-30 seconds. 6. Slowly release and straighten your leg. Pelvic Tilt Repeat these steps 5-10 times: 1. Lie on your back on a firm bed or the floor with your legs extended. 2. Bend your knees so they are pointing toward the ceiling and your feet are flat on the floor. 3. Tighten your lower abdominal muscles to press your lower back against the floor. This motion will tilt your pelvis so your tailbone points up toward the ceiling instead of pointing to your feet or the floor. 4. With gentle tension and even breathing, hold this position for 5-10 seconds. Cat-Cow Repeat these steps until your lower back becomes more flexible: 1. Get into a hands-and-knees position on a firm surface. Keep your hands under your shoulders, and  keep your knees under your hips. You may place padding under your knees for comfort. 2. Let your head hang down, and point your tailbone toward the floor so your lower back becomes rounded like the back of a cat. 3. Hold this position for 5 seconds. 4. Slowly lift your head and point your tailbone up toward the ceiling so your back forms a sagging arch like the back of a cow. 5. Hold this position for 5 seconds.  Press-Ups Repeat these steps 5-10 times: 1. Lie on your abdomen (face-down) on the floor. 2. Place your palms near your head, about shoulder-width apart. 3. While you keep your back as relaxed as possible and keep your hips on the floor, slowly straighten your arms to raise the top half of your body and lift your shoulders. Do not use your back muscles to raise your upper torso. You may adjust the placement of your hands to make yourself more comfortable. 4. Hold this position for 5 seconds while you keep your back relaxed. 5. Slowly return to lying flat on the floor.  Bridges Repeat these steps 10 times: 1. Lie on your back on a firm surface. 2. Bend your knees so they are pointing toward the ceiling and your feet are flat on the floor. 3. Tighten your buttocks muscles and lift your buttocks off of the floor until your waist is at almost the same height as your  knees. You should feel the muscles working in your buttocks and the back of your thighs. If you do not feel these muscles, slide your feet 1-2 inches farther away from your buttocks. 4. Hold this position for 3-5 seconds. 5. Slowly lower your hips to the starting position, and allow your buttocks muscles to relax completely. If this exercise is too easy, try doing it with your arms crossed over your chest. Abdominal Crunches Repeat these steps 5-10 times: 1. Lie on your back on a firm bed or the floor with your legs extended. 2. Bend your knees so they are pointing toward the ceiling and your feet are flat on the floor. 3.  Cross your arms over your chest. 4. Tip your chin slightly toward your chest without bending your neck. 5. Tighten your abdominal muscles and slowly raise your trunk (torso) high enough to lift your shoulder blades a tiny bit off of the floor. Avoid raising your torso higher than that, because it can put too much stress on your low back and it does not help to strengthen your abdominal muscles. 6. Slowly return to your starting position. Back Lifts Repeat these steps 5-10 times: 1. Lie on your abdomen (face-down) with your arms at your sides, and rest your forehead on the floor. 2. Tighten the muscles in your legs and your buttocks. 3. Slowly lift your chest off of the floor while you keep your hips pressed to the floor. Keep the back of your head in line with the curve in your back. Your eyes should be looking at the floor. 4. Hold this position for 3-5 seconds. 5. Slowly return to your starting position. Contact a health care provider if:  Your back pain or discomfort gets much worse when you do an exercise.  Your back pain or discomfort does not lessen within 2 hours after you exercise. If you have any of these problems, stop doing these exercises right away. Do not do them again unless your health care provider says that you can. Get help right away if:  You develop sudden, severe back pain. If this happens, stop doing the exercises right away. Do not do them again unless your health care provider says that you can. This information is not intended to replace advice given to you by your health care provider. Make sure you discuss any questions you have with your health care provider. Document Released: 04/21/2004 Document Revised: 07/18/2017 Document Reviewed: 05/08/2014 Elsevier Interactive Patient Education  2019 [place].  Core Strength Exercises  Core exercises help to build strength in the muscles between your ribs and your hips (abdominal muscles). These muscles help to  support your body and keep your spine stable. It is important to maintain strength in your core to prevent injury and pain. Some activities, such as yoga and Pilates, can help to strengthen core muscles. You can also strengthen core muscles with exercises at home. It is important to talk to your health care provider before you start a new exercise routine. What are the benefits of core strength exercises? Core strength exercises can:  Reduce back pain.  Help to rebuild strength after a back or spine injury.  Help to prevent injury during physical activity, especially injuries to the back and knees. How to do core strength exercises Repeat these exercises 10-15 times, or until you are tired. Do exercises exactly as told by your health care provider and adjust them as directed. It is normal to feel mild stretching, pulling, tightness, or  discomfort as you do these exercises. If you feel any pain while doing these exercises, stop. If your pain continues or gets worse when doing core exercises, contact your health care provider. You may want to use a padded yoga or exercise mat for strength exercises that are done on the floor. Bridging  1. Lie on your back on a firm surface with your knees bent and your feet flat on the floor. 2. Raise your hips so that your knees, hips, and shoulders form a straight line together. Keep your abdominal muscles tight. 3. Hold this position for 3-5 seconds. 4. Slowly lower your hips to the starting position. 5. Let your muscles relax completely between repetitions. Single-leg bridge 1. Lie on your back on a firm surface with your knees bent and your feet flat on the floor. 2. Raise your hips so that your knees, hips, and shoulders form a straight line together. Keep your abdominal muscles tight. 3. Lift one foot off the floor, then completely straighten that leg. 4. Hold this position for 3-5 seconds. 5. Put the straight leg back down in the bent position. 6.  Slowly lower your hips to the starting position. 7. Repeat these steps using your other leg. Side bridge 1. Lie on your side with your knees bent. Prop yourself up on the elbow that is near the floor. 2. Using your abdominal muscles and your elbow that is on the floor, raise your body off the floor. Raise your hip so that your shoulder, hip, and foot form a straight line together. 3. Hold this position for 10 seconds. Keep your head and neck raised and away from your shoulder (in their normal, neutral position). Keep your abdominal muscles tight. 4. Slowly lower your hip to the starting position. 5. Repeat and try to hold this position longer, working your way up to 30 seconds. Abdominal crunch 1. Lie on your back on a firm surface. Bend your knees and keep your feet flat on the floor. 2. Cross your arms over your chest. 3. Without bending your neck, tip your chin slightly toward your chest. 4. Tighten your abdominal muscles as you lift your chest just high enough to lift your shoulder blades off of the floor. Do not hold your breath. You can do this with short lifts or long lifts. 5. Slowly return to the starting position. Bird dog 1. Get on your hands and knees, with your legs shoulder-width apart and your arms under your shoulders. Keep your back straight. 2. Tighten your abdominal muscles. 3. Raise one of your legs off the floor and straighten it. Try to keep it parallel to the floor. 4. Slowly lower your leg to the starting position. 5. Raise one of your arms off the floor and straighten it. Try to keep it parallel to the floor. 6. Slowly lower your arm to the starting position. 7. Repeat with the other arm and leg. If possible, try raising a leg and arm at the same time, on opposite sides of the body. For example, raise your left hand and your right leg. Plank 1. Lie on your belly. 2. Prop up your body onto your forearms and your feet, keeping your legs straight. Your body should make a  straight line between your shoulders and feet. 3. Hold this position for 10 seconds while keeping your abdominal muscles tight. 4. Lower your body to the starting position. 5. Repeat and try to hold this position longer, working your way up to 30 seconds. Cross-core strengthening  1. Stand with your feet shoulder-width apart. 2. Hold a ball out in front of you. Keep your arms straight. 3. Tighten your abdominal muscles and slowly rotate at your waist from side to side. Keep your feet flat. 4. Once you are comfortable, try repeating this exercise with a heavier ball. Top core strengthening 1. Stand about 18 inches (46 cm) in front of a wall, with your back to the wall. 2. Keep your feet flat and shoulder-width apart. 3. Tighten your abdominal muscles. 4. Bend your hips and knees. 5. Slowly reach between your legs to touch the wall behind you. 6. Slowly stand back up. 7. Raise your arms over your head and reach behind you. 8. Return to the starting position. General tips  Do not do any exercises that cause pain. If you have pain while exercising, talk to your health care provider.  Always stretch before and after doing these exercises. This can help prevent injury.  Maintain a healthy weight. Ask your health care provider what weight is healthy for you. Contact a health care provider if:  You have back pain that gets worse or does not go away.  You feel pain while doing core strength exercises. Get help right away if:  You have severe pain that does not get better with medicine. Summary  Core exercises help to build strength in the muscles between your ribs and your waist.  Core muscles help to support your body and keep your spine stable.  Some activities, such as yoga and Pilates, can help to strengthen core muscles.  Core strength exercises can help back pain and can prevent injury.  If you feel any pain while doing core strength exercises, stop. This information is not  intended to replace advice given to you by your health care provider. Make sure you discuss any questions you have with your health care provider. Document Released: 08/03/2016 Document Revised: 08/03/2016 Document Reviewed: 08/03/2016 Elsevier Interactive Patient Education  2019 [place].  Shoulder Impingement Syndrome  Shoulder impingement syndrome is a condition that causes pain when connective tissues (tendons) surrounding the shoulder joint become pinched. These tendons are part of the group of muscles and tissues that help to stabilize the shoulder (rotator cuff). Beneath the rotator cuff is a fluid-filled sac (bursa) that allows the muscles and tendons to glide smoothly. The bursa may become swollen or irritated (bursitis). Bursitis, swelling in the rotator cuff tendons, or both conditions can decrease how much space is under a bone in the shoulder joint (acromion), resulting in impingement. What are the causes? Shoulder impingement syndrome may be caused by bursitis or swelling of the rotator cuff tendons, which may result from:  Repetitive overhead arm movements.  Falling onto the shoulder.  Weakness in the shoulder muscles. What increases the risk? You may be more likely to develop this condition if you:  Play sports that involve throwing, such as baseball.  Participate in sports such as tennis, volleyball, and swimming.  Work as a Curator, Games developer, or Architect. Some people are also more likely to develop impingement syndrome because of the shape of their acromion bone. What are the signs or symptoms? The main symptom of this condition is pain on the front or side of the shoulder. The pain may:  Get worse when lifting or raising the arm.  Get worse at night.  Wake you up from sleeping.  Feel sharp when the shoulder is moved and then fade to an ache. Other symptoms may include:  Tenderness.  Stiffness.  Inability to raise the arm above shoulder level or  behind the body.  Weakness. How is this diagnosed? This condition may be diagnosed based on:  Your symptoms and medical history.  A physical exam.  Imaging tests, such as: ? X-rays. ? MRI. ? Ultrasound. How is this treated? This condition may be treated by:  Resting your shoulder and avoiding all activities that cause pain or put stress on the shoulder.  Icing your shoulder.  NSAIDs to help reduce pain and swelling.  One or more injections of medicines to numb the area and reduce inflammation.  Physical therapy.  Surgery. This may be needed if nonsurgical treatments have not helped. Surgery may involve repairing the rotator cuff, reshaping the acromion, or removing the bursa. Follow these instructions at home: Managing pain, stiffness, and swelling   If directed, put ice on the injured area. ? Put ice in a plastic bag. ? Place a towel between your skin and the bag. ? Leave the ice on for 20 minutes, 2-3 times a day. Activity  Rest and return to your normal activities as told by your health care provider. Ask your health care provider what activities are safe for you.  Do exercises as told by your health care provider. General instructions  Do not use any products that contain nicotine or tobacco, such as cigarettes, e-cigarettes, and chewing tobacco. These can delay healing. If you need help quitting, ask your health care provider.  Ask your health care provider when it is safe for you to drive.  Take over-the-counter and prescription medicines only as told by your health care provider.  Keep all follow-up visits as told by your health care provider. This is important. How is this prevented?  Give your body time to rest between periods of activity.  Be safe and responsible while being active. This will help you avoid falls.  Maintain physical fitness, including strength and flexibility. Contact a health care provider if:  Your symptoms have not improved  after 1-2 months of treatment and rest.  You cannot lift your arm away from your body. Summary  Shoulder impingement syndrome is a condition that causes pain when connective tissues (tendons) surrounding the shoulder joint become pinched.  The main symptom of this condition is pain on the front or side of the shoulder.  This condition is usually treated with rest, ice, and pain medicines as needed. This information is not intended to replace advice given to you by your health care provider. Make sure you discuss any questions you have with your health care provider. Document Released: 03/14/2005 Document Revised: 09/06/2017 Document Reviewed: 09/06/2017 Elsevier Interactive Patient Education  2019 [place] Cuff Tendinitis  Rotator cuff tendinitis is inflammation of the tough, cord-like bands that connect muscle to bone (tendons) in the rotator cuff. The rotator cuff includes all of the muscles and tendons that connect the arm to the shoulder. The rotator cuff holds the head of the upper arm bone (humerus) in the cup (fossa) of the shoulder blade (scapula). This condition can lead to a long-lasting (chronic) tear. The tear may be partial or complete. What are the causes? This condition is usually caused by overusing the rotator cuff. What increases the risk? This condition is more likely to develop in athletes and workers who frequently use their shoulder or reach over their heads. This can include activities such as:  Tennis.  Baseball or softball.  Swimming.  Construction work.  Painting. What are the signs  or symptoms? Symptoms of this condition include:  Pain spreading (radiating) from the shoulder to the upper arm.  Swelling and tenderness in front of the shoulder.  Pain when reaching, pulling, or lifting the arm above the head.  Pain when lowering the arm from above the head.  Minor pain in the shoulder when resting.  Increased pain in the shoulder at  night.  Difficulty placing the arm behind the back. How is this diagnosed? This condition is diagnosed with a medical history and physical exam. Tests may also be done, including:  X-rays.  MRI.  Ultrasounds.  CT or MR arthrogram. During this test, a contrast material is injected and then images are taken. How is this treated? Treatment for this condition depends on the severity of the condition. In less severe cases, treatment may include:  Rest. This may be done with a sling that holds the shoulder still (immobilization). Your health care provider may also recommend avoiding activities that involve lifting your arm over your head.  Icing the shoulder.  Anti-inflammatory medicines, such as aspirin or ibuprofen. In more severe cases, treatment may include:  Physical therapy.  Steroid injections.  Surgery. Follow these instructions at home: If you have a sling:  Wear the sling as told by your health care provider. Remove it only as told by your health care provider.  Loosen the sling if your fingers tingle, become numb, or turn cold and blue.  Keep the sling clean.  If the sling is not waterproof, do not let it get wet. Remove it, if allowed, or cover it with a watertight covering when you take a bath or shower. Managing pain, stiffness, and swelling  If directed, put ice on the injured area. ? If you have a removable sling, remove it as told by your health care provider. ? Put ice in a plastic bag. ? Place a towel between your skin and the bag. ? Leave the ice on for 20 minutes, 2-3 times a day.  Move your fingers often to avoid stiffness and to lessen swelling.  Raise (elevate) the injured area above the level of your heart while you are lying down.  Find a comfortable sleeping position or sleep on a recliner, if available. Driving  Do not drive or use heavy machinery while taking prescription pain medicine.  Ask your health care provider when it is safe to  drive if you have a sling on your arm. Activity  Rest your shoulder as told by your health care provider.  Return to your normal activities as told by your health care provider. Ask your health care provider what activities are safe for you.  Do any exercises or stretches as told by your health care provider.  If you do repetitive overhead tasks, take small breaks in between and include stretching exercises as told by your health care provider. General instructions  Do not use any products that contain nicotine or tobacco, such as cigarettes and e-cigarettes. These can delay healing. If you need help quitting, ask your health care provider.  Take over-the-counter and prescription medicines only as told by your health care provider.  Keep all follow-up visits as told by your health care provider. This is important. Contact a health care provider if:  Your pain gets worse.  You have new pain in your arm, hands, or fingers.  Your pain is not relieved with medicine or does not get better after 6 weeks of treatment.  You have cracking sensations when moving your shoulder  in certain directions.  You hear a snapping sound after using your shoulder, followed by severe pain and weakness. Get help right away if:  Your arm, hand, or fingers are numb or tingling.  Your arm, hand, or fingers are swollen or painful or they turn white or blue. Summary  Rotator cuff tendinitis is inflammation of the tough, cord-like bands that connect muscle to bone (tendons) in the rotator cuff.  This condition is usually caused by overusing the rotator cuff, which includes all of the muscles and tendons that connect the arm to the shoulder.  This condition is more likely to develop in athletes and workers who frequently use their shoulder or reach over their heads.  Treatment generally includes rest, anti-inflammatory medicines, and icing. In some cases, physical therapy and steroid injections may be  needed. In severe cases, surgery may be needed. This information is not intended to replace advice given to you by your health care provider. Make sure you discuss any questions you have with your health care provider. Document Released: 06/04/2003 Document Revised: 02/29/2016 Document Reviewed: 02/29/2016 Elsevier Interactive Patient Education  2019 [employer].
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
## 2018-09-21 ENCOUNTER — Telehealth: Payer: Self-pay | Admitting: Internal Medicine

## 2018-09-21 ENCOUNTER — Encounter: Payer: Self-pay | Admitting: Internal Medicine

## 2018-09-21 ENCOUNTER — Ambulatory Visit: Payer: No Typology Code available for payment source | Admitting: Internal Medicine

## 2018-09-21 DIAGNOSIS — G4700 Insomnia, unspecified: Secondary | ICD-10-CM

## 2018-09-21 DIAGNOSIS — F4321 Adjustment disorder with depressed mood: Secondary | ICD-10-CM

## 2018-09-21 MED ORDER — LORAZEPAM 0.5 MG PO TABS: 0.5000 mg | ORAL_TABLET | Freq: Every evening | ORAL | 0 refills | Status: DC | PRN

## 2018-09-21 NOTE — Telephone Encounter (Signed)
Brother died 09/18/18 he was really close with brother in [place] since his mother died age 56 y.o Since he found out tearful, Reduced appetite unable to sleep, unable to work   Rx Ativan 0.5 qhs prn

## 2018-10-01 ENCOUNTER — Other Ambulatory Visit: Payer: Self-pay

## 2018-10-01 DIAGNOSIS — I10 Essential (primary) hypertension: Secondary | ICD-10-CM

## 2018-10-01 MED ORDER — AMLODIPINE BESYLATE 5 MG PO TABS: 5.0000 mg | ORAL_TABLET | Freq: Every day | ORAL | 1 refills | Status: DC

## 2018-10-01 MED ORDER — CANDESARTAN CILEXETIL 32 MG PO TABS: 16.0000 mg | ORAL_TABLET | Freq: Every day | ORAL | Status: DC

## 2018-10-01 MED ORDER — ACCU-CHEK GUIDE VI STRP: ORAL_STRIP | 5 refills | Status: DC

## 2018-10-10 ENCOUNTER — Other Ambulatory Visit: Payer: Self-pay

## 2018-10-12 ENCOUNTER — Ambulatory Visit (INDEPENDENT_AMBULATORY_CARE_PROVIDER_SITE_OTHER): Payer: No Typology Code available for payment source | Admitting: Internal Medicine

## 2018-10-12 ENCOUNTER — Other Ambulatory Visit: Payer: Self-pay

## 2018-10-12 ENCOUNTER — Encounter: Payer: Self-pay | Admitting: Internal Medicine

## 2018-10-12 VITALS — BP 124/72 | HR 75 | Temp 98.0°F | Resp 16 | Wt 287.0 lb

## 2018-10-12 DIAGNOSIS — M545 Low back pain, unspecified: Secondary | ICD-10-CM

## 2018-10-12 DIAGNOSIS — M5416 Radiculopathy, lumbar region: Secondary | ICD-10-CM

## 2018-10-12 DIAGNOSIS — I10 Essential (primary) hypertension: Secondary | ICD-10-CM

## 2018-10-12 DIAGNOSIS — E119 Type 2 diabetes mellitus without complications: Secondary | ICD-10-CM | POA: Diagnosis not present

## 2018-10-12 DIAGNOSIS — R946 Abnormal results of thyroid function studies: Secondary | ICD-10-CM | POA: Diagnosis not present

## 2018-10-12 DIAGNOSIS — F5102 Adjustment insomnia: Secondary | ICD-10-CM

## 2018-10-12 DIAGNOSIS — M25511 Pain in right shoulder: Secondary | ICD-10-CM

## 2018-10-12 DIAGNOSIS — M549 Dorsalgia, unspecified: Secondary | ICD-10-CM

## 2018-10-12 DIAGNOSIS — G8929 Other chronic pain: Secondary | ICD-10-CM

## 2018-10-12 DIAGNOSIS — M5441 Lumbago with sciatica, right side: Secondary | ICD-10-CM

## 2018-10-12 LAB — COMPREHENSIVE METABOLIC PANEL
ALT: 19 U/L (ref 0–53)
AST: 15 U/L (ref 0–37)
Albumin: 4.2 g/dL (ref 3.5–5.2)
Alkaline Phosphatase: 68 U/L (ref 39–117)
BUN: 22 mg/dL (ref 6–23)
CO2: 25 mEq/L (ref 19–32)
Calcium: 9.4 mg/dL (ref 8.4–10.5)
Chloride: 105 mEq/L (ref 96–112)
Creatinine, Ser: 1.23 mg/dL (ref 0.40–1.50)
GFR: 73.69 mL/min (ref >60.00)
Glucose, Bld: 122 mg/dL — ABNORMAL HIGH (ref 70–99)
Potassium: 3.9 mEq/L (ref 3.5–5.1)
Sodium: 139 mEq/L (ref 135–145)
Total Bilirubin: 0.4 mg/dL (ref 0.2–1.2)
Total Protein: 7.2 g/dL (ref 6.0–8.3)

## 2018-10-12 LAB — HEMOGLOBIN A1C: Hgb A1c MFr Bld: 6.1 % (ref 4.6–6.5)

## 2018-10-12 LAB — TSH: TSH: 2.51 u[IU]/mL (ref 0.35–4.50)

## 2018-10-12 MED ORDER — METFORMIN HCL 1000 MG PO TABS: 1000.0000 mg | ORAL_TABLET | Freq: Every day | ORAL | 3 refills | Status: DC

## 2018-10-12 MED ORDER — METHOCARBAMOL 500 MG PO TABS: 500.0000 mg | ORAL_TABLET | Freq: Every evening | ORAL | 2 refills | Status: DC | PRN

## 2018-10-12 NOTE — Patient Instructions (Signed)
Back Injury Prevention Back injuries can be very painful. They can also be difficult to heal. After having one back injury, you are more likely to have another one again. It is important to learn how to avoid injuring or re-injuring your back. The following tips can help you to prevent a back injury. What actions can I take to prevent back injuries? Nutrition changes Talk with your health care provider about your overall diet, and especially about foods that strengthen your bones.  Ask your health care provider how much calcium and vitamin D you need each day. These nutrients help to prevent weakening of the bones (osteoporosis). Osteoporosis can cause broken (fractured) bones, which lead to back pain.  Eat foods that are good sources of calcium. These include dairy products, green leafy vegetables, and products that have had calcium added to them (fortified).  Eat foods that are good sources of vitamin D. These include milk and foods that are fortified with vitamin D.  If needed, take supplements and vitamins as directed by your health care provider. Physical fitness Physical fitness strengthens your bones and your muscles. It also increases your balance and strength.  Exercise for 30 minutes per day on most days of the week, or as directed by your health care provider. Make sure to: ? Do aerobic exercises, such as walking, jogging, biking, or swimming. ? Do exercises that increase balance and strength, such as tai chi and yoga. These can decrease your risk of falling and injuring your back. ? Do stretching exercises to help with flexibility. ? Develop strong abdominal muscles. Your abdominal muscles provide a lot of the support that your back needs.  Maintain a healthy weight. This helps to decrease your risk of a back injury. Good posture        Prevent back injuries by developing and maintaining a good posture. To do this successfully:  Sit up and stand up straight. Avoid leaning  forward when you sit or hunching over when you stand.  Choose chairs that have good low-back (lumbar) support.  If you work at a desk, sit close to it so you do not need to lean over. Keep your chin tucked in. Keep your neck drawn back, and keep your elbows bent at a right angle.  Sit high and close to the steering wheel when you drive. Add a lumbar support to your car seat, if needed.  Avoid sitting or standing in one position for very long. Take breaks to get up, stretch, and walk around at least one time every hour. Take breaks every hour if you are driving for long periods of time.  Sleep on your side with your knees slightly bent, or sleep on your back with a pillow under your knees.  Lifting, twisting, and reaching Back injuries are more likely to occur when carrying loads and twisting at the same time. When you bend and lift, or reach for items that are high up in shelves, use positions that put less stress on your back.  Heavy lifting ? Avoid heavy lifting, especially the kind of heavy lifting that is repetitive. If you must do heavy lifting: ? Stretch before lifting. ? Work slowly. ? Rest between lifts. ? Use a tool such as a cart or a dolly to move objects. ? Make several small trips instead of carrying one heavy load. ? Ask for help when you need it, especially when moving big or heavy objects. ? Follow these steps when lifting: ? Stand with your feet  shoulder-width apart. ? Get as close to the object as you can. Do not try to pick up a heavy object that is far from your body. ? Use handles or lifting straps if they are available. ? Bend at your knees. Squat down, but keep your heels off the floor. ? Keep your shoulders pulled back, your chin tucked in, and your back straight. ? Lift the object slowly while you tighten the muscles in your legs, abdomen, and buttocks. Keep the object as close to the center of your body as possible. ? Follow these steps when putting down a  heavy load: ? Stand with your feet shoulder-width apart. ? Lower the object slowly while you tighten the muscles in your legs, abdomen, and buttocks. Keep the object as close to the center of your body as possible. ? Keep your shoulders pulled back, your chin tucked in, and your back straight. ? Bend at your knees. Squat down, but keep your heels off the floor. ? Use handles or lifting straps if they are available.  Twisting and reaching ? Avoid lifting heavy objects above your waist. ? Do not twist at your waist while you are lifting or carrying a load. If you need to turn, move your feet. ? Do not bend over without bending at your knees. ? Avoid reaching over your head, across a table, or for an object on a high surface.  Other changes   Avoid wet floors and icy ground. Keep sidewalks clear of ice to prevent falls.  Do not sleep on a mattress that is too soft or too hard.  Put heavier objects on shelves at waist level, and put lighter objects on lower or higher shelves.  Find ways to decrease your stress, such as by exercising, getting a massage, or practicing relaxation techniques. Stress can build up in your muscles. Tense muscles are more vulnerable to injury.  Talk with your health care provider if you feel anxious or depressed. These conditions can make back pain worse.  Wear flat heel shoes with cushioned soles.  Use both shoulder straps when carrying a backpack.  Do not use any products that contain nicotine or tobacco, such as cigarettes and e-cigarettes. If you need help quitting, ask your health care provider. Summary  Back injuries can be very painful and difficult to heal.  You can prevent injuring or re-injuring your back by making nutrition changes, working on being physically fit, developing a good posture, and lifting heavy objects in a safe way.  Making other changes can also help to prevent back injuries. These include eating a healthy diet, exercising  regularly and maintaining a healthy weight. This information is not intended to replace advice given to you by your health care provider. Make sure you discuss any questions you have with your health care provider. Document Released: 04/21/2004 Document Revised: 04/29/2017 Document Reviewed: 04/29/2017 Elsevier Patient Education  2020 [employer].  Back Exercises The following exercises strengthen the muscles that help to support the trunk and back. They also help to keep the lower back flexible. Doing these exercises can help to prevent back pain or lessen existing pain.  If you have back pain or discomfort, try doing these exercises 2-3 times each day or as told by your health care provider.  As your pain improves, do them once each day, but increase the number of times that you repeat the steps for each exercise (do more repetitions).  To prevent the recurrence of back pain, continue to do  these exercises once each day or as told by your health care provider. Do exercises exactly as told by your health care provider and adjust them as directed. It is normal to feel mild stretching, pulling, tightness, or discomfort as you do these exercises, but you should stop right away if you feel sudden pain or your pain gets worse. Exercises Single knee to chest Repeat these steps 3-5 times for each leg: 1. Lie on your back on a firm bed or the floor with your legs extended. 2. Bring one knee to your chest. Your other leg should stay extended and in contact with the floor. 3. Hold your knee in place by grabbing your knee or thigh with both hands and hold. 4. Pull on your knee until you feel a gentle stretch in your lower back or buttocks. 5. Hold the stretch for 10-30 seconds. 6. Slowly release and straighten your leg. Pelvic tilt Repeat these steps 5-10 times: 1. Lie on your back on a firm bed or the floor with your legs extended. 2. Bend your knees so they are pointing toward the ceiling and  your feet are flat on the floor. 3. Tighten your lower abdominal muscles to press your lower back against the floor. This motion will tilt your pelvis so your tailbone points up toward the ceiling instead of pointing to your feet or the floor. 4. With gentle tension and even breathing, hold this position for 5-10 seconds. Cat-cow Repeat these steps until your lower back becomes more flexible: 1. Get into a hands-and-knees position on a firm surface. Keep your hands under your shoulders, and keep your knees under your hips. You may place padding under your knees for comfort. 2. Let your head hang down toward your chest. Contract your abdominal muscles and point your tailbone toward the floor so your lower back becomes rounded like the back of a cat. 3. Hold this position for 5 seconds. 4. Slowly lift your head, let your abdominal muscles relax and point your tailbone up toward the ceiling so your back forms a sagging arch like the back of a cow. 5. Hold this position for 5 seconds.  Press-ups Repeat these steps 5-10 times: 1. Lie on your abdomen (face-down) on the floor. 2. Place your palms near your head, about shoulder-width apart. 3. Keeping your back as relaxed as possible and keeping your hips on the floor, slowly straighten your arms to raise the top half of your body and lift your shoulders. Do not use your back muscles to raise your upper torso. You may adjust the placement of your hands to make yourself more comfortable. 4. Hold this position for 5 seconds while you keep your back relaxed. 5. Slowly return to lying flat on the floor.  Bridges Repeat these steps 10 times: 1. Lie on your back on a firm surface. 2. Bend your knees so they are pointing toward the ceiling and your feet are flat on the floor. Your arms should be flat at your sides, next to your body. 3. Tighten your buttocks muscles and lift your buttocks off the floor until your waist is at almost the same height as your  knees. You should feel the muscles working in your buttocks and the back of your thighs. If you do not feel these muscles, slide your feet 1-2 inches farther away from your buttocks. 4. Hold this position for 3-5 seconds. 5. Slowly lower your hips to the starting position, and allow your buttocks muscles to relax completely.  If this exercise is too easy, try doing it with your arms crossed over your chest. Abdominal crunches Repeat these steps 5-10 times: 1. Lie on your back on a firm bed or the floor with your legs extended. 2. Bend your knees so they are pointing toward the ceiling and your feet are flat on the floor. 3. Cross your arms over your chest. 4. Tip your chin slightly toward your chest without bending your neck. 5. Tighten your abdominal muscles and slowly raise your trunk (torso) high enough to lift your shoulder blades a tiny bit off the floor. Avoid raising your torso higher than that because it can put too much stress on your low back and does not help to strengthen your abdominal muscles. 6. Slowly return to your starting position. Back lifts Repeat these steps 5-10 times: 1. Lie on your abdomen (face-down) with your arms at your sides, and rest your forehead on the floor. 2. Tighten the muscles in your legs and your buttocks. 3. Slowly lift your chest off the floor while you keep your hips pressed to the floor. Keep the back of your head in line with the curve in your back. Your eyes should be looking at the floor. 4. Hold this position for 3-5 seconds. 5. Slowly return to your starting position. Contact a health care provider if:  Your back pain or discomfort gets much worse when you do an exercise.  Your worsening back pain or discomfort does not lessen within 2 hours after you exercise. If you have any of these problems, stop doing these exercises right away. Do not do them again unless your health care provider says that you can. Get help right away if:  You develop  sudden, severe back pain. If this happens, stop doing the exercises right away. Do not do them again unless your health care provider says that you can. This information is not intended to replace advice given to you by your health care provider. Make sure you discuss any questions you have with your health care provider. Document Released: 04/21/2004 Document Revised: 07/19/2018 Document Reviewed: 12/14/2017 Elsevier Patient Education  2020 [employer].
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
## 2018-10-15 LAB — THYROID PEROXIDASE ANTIBODY: Thyroperoxidase Ab SerPl-aCnc: 1 IU/mL (ref <9)

## 2018-10-16 ENCOUNTER — Encounter: Payer: Self-pay | Admitting: *Deleted

## 2018-10-26 ENCOUNTER — Other Ambulatory Visit: Payer: Self-pay | Admitting: Internal Medicine

## 2018-10-26 DIAGNOSIS — E119 Type 2 diabetes mellitus without complications: Secondary | ICD-10-CM

## 2018-10-26 MED ORDER — JANUMET XR 100-1000 MG PO TB24: 1.0000 | ORAL_TABLET | Freq: Every day | ORAL | 3 refills | Status: DC

## 2018-10-30 ENCOUNTER — Ambulatory Visit (HOSPITAL_COMMUNITY)
Admission: RE | Admit: 2018-10-30 | Discharge: 2018-10-30 | Disposition: A | Discharge status: Home / Self Care | Payer: No Typology Code available for payment source | Source: Ambulatory Visit | Attending: Internal Medicine | Admitting: Internal Medicine

## 2018-10-30 ENCOUNTER — Encounter (HOSPITAL_COMMUNITY): Payer: Self-pay

## 2018-10-30 ENCOUNTER — Other Ambulatory Visit: Payer: Self-pay

## 2018-10-30 DIAGNOSIS — M5416 Radiculopathy, lumbar region: Secondary | ICD-10-CM

## 2018-10-30 DIAGNOSIS — M5441 Lumbago with sciatica, right side: Secondary | ICD-10-CM | POA: Diagnosis present

## 2018-10-30 DIAGNOSIS — M549 Dorsalgia, unspecified: Secondary | ICD-10-CM | POA: Diagnosis present

## 2018-10-31 ENCOUNTER — Other Ambulatory Visit: Payer: Self-pay | Admitting: Internal Medicine

## 2018-10-31 DIAGNOSIS — M545 Low back pain, unspecified: Secondary | ICD-10-CM

## 2018-10-31 DIAGNOSIS — R937 Abnormal findings on diagnostic imaging of other parts of musculoskeletal system: Secondary | ICD-10-CM

## 2018-10-31 DIAGNOSIS — G8929 Other chronic pain: Secondary | ICD-10-CM

## 2018-11-14 ENCOUNTER — Telehealth: Payer: Self-pay

## 2018-11-14 NOTE — Telephone Encounter (Signed)
Form faxed to [employer].

## 2018-11-23 NOTE — Addendum Note (Signed)
Addended by: Orland Mustard on: 11/23/2018 08:08 PM   Modules accepted: Level of Service

## 2018-11-27 ENCOUNTER — Other Ambulatory Visit: Payer: Self-pay | Admitting: Internal Medicine

## 2018-11-27 DIAGNOSIS — G8929 Other chronic pain: Secondary | ICD-10-CM

## 2018-11-27 MED ORDER — TRAMADOL HCL 50 MG PO TABS: 50.0000 mg | ORAL_TABLET | Freq: Two times a day (BID) | ORAL | 0 refills | Status: AC | PRN

## 2018-11-30 NOTE — Addendum Note (Signed)
Addended by: Orland Mustard on: 11/30/2018 01:48 PM   Modules accepted: Level of Service

## 2018-12-10 ENCOUNTER — Encounter: Payer: Self-pay | Admitting: Gastroenterology

## 2018-12-14 ENCOUNTER — Emergency Department (HOSPITAL_COMMUNITY): Payer: No Typology Code available for payment source

## 2018-12-14 ENCOUNTER — Other Ambulatory Visit: Payer: Self-pay

## 2018-12-14 ENCOUNTER — Emergency Department (HOSPITAL_COMMUNITY)
Admission: EM | Admit: 2018-12-14 | Discharge: 2018-12-14 | Disposition: A | Discharge status: Home / Self Care | Payer: No Typology Code available for payment source | Attending: Emergency Medicine | Admitting: Emergency Medicine

## 2018-12-14 ENCOUNTER — Telehealth: Payer: Self-pay

## 2018-12-14 ENCOUNTER — Encounter (HOSPITAL_COMMUNITY): Payer: Self-pay

## 2018-12-14 DIAGNOSIS — S0003XA Contusion of scalp, initial encounter: Secondary | ICD-10-CM | POA: Diagnosis not present

## 2018-12-14 DIAGNOSIS — N183 Chronic kidney disease, stage 3 (moderate): Secondary | ICD-10-CM | POA: Diagnosis not present

## 2018-12-14 DIAGNOSIS — Y9289 Other specified places as the place of occurrence of the external cause: Secondary | ICD-10-CM | POA: Diagnosis not present

## 2018-12-14 DIAGNOSIS — E1122 Type 2 diabetes mellitus with diabetic chronic kidney disease: Secondary | ICD-10-CM | POA: Diagnosis not present

## 2018-12-14 DIAGNOSIS — Z8546 Personal history of malignant neoplasm of prostate: Secondary | ICD-10-CM | POA: Insufficient documentation

## 2018-12-14 DIAGNOSIS — Y990 Civilian activity done for income or pay: Secondary | ICD-10-CM | POA: Diagnosis not present

## 2018-12-14 DIAGNOSIS — S022XXA Fracture of nasal bones, initial encounter for closed fracture: Secondary | ICD-10-CM | POA: Insufficient documentation

## 2018-12-14 DIAGNOSIS — W19XXXA Unspecified fall, initial encounter: Secondary | ICD-10-CM | POA: Insufficient documentation

## 2018-12-14 DIAGNOSIS — M62838 Other muscle spasm: Secondary | ICD-10-CM | POA: Diagnosis not present

## 2018-12-14 DIAGNOSIS — Z79899 Other long term (current) drug therapy: Secondary | ICD-10-CM | POA: Insufficient documentation

## 2018-12-14 DIAGNOSIS — I129 Hypertensive chronic kidney disease with stage 1 through stage 4 chronic kidney disease, or unspecified chronic kidney disease: Secondary | ICD-10-CM | POA: Diagnosis not present

## 2018-12-14 DIAGNOSIS — Z87891 Personal history of nicotine dependence: Secondary | ICD-10-CM | POA: Insufficient documentation

## 2018-12-14 DIAGNOSIS — S0990XA Unspecified injury of head, initial encounter: Secondary | ICD-10-CM | POA: Diagnosis present

## 2018-12-14 DIAGNOSIS — Z7984 Long term (current) use of oral hypoglycemic drugs: Secondary | ICD-10-CM | POA: Insufficient documentation

## 2018-12-14 DIAGNOSIS — Y9389 Activity, other specified: Secondary | ICD-10-CM | POA: Insufficient documentation

## 2018-12-14 DIAGNOSIS — R55 Syncope and collapse: Secondary | ICD-10-CM | POA: Insufficient documentation

## 2018-12-14 DIAGNOSIS — S50811A Abrasion of right forearm, initial encounter: Secondary | ICD-10-CM | POA: Diagnosis not present

## 2018-12-14 DIAGNOSIS — S161XXA Strain of muscle, fascia and tendon at neck level, initial encounter: Secondary | ICD-10-CM | POA: Diagnosis not present

## 2018-12-14 LAB — URINALYSIS, ROUTINE W REFLEX MICROSCOPIC
Bacteria, UA: NONE SEEN
Bilirubin Urine: NEGATIVE
Glucose, UA: NEGATIVE mg/dL
Hgb urine dipstick: NEGATIVE
Ketones, ur: NEGATIVE mg/dL
Nitrite: NEGATIVE
Protein, ur: NEGATIVE mg/dL
Specific Gravity, Urine: 1.016 (ref 1.005–1.030)
pH: 5 (ref 5.0–8.0)

## 2018-12-14 LAB — CBC
HCT: 44.2 % (ref 39.0–52.0)
Hemoglobin: 14 g/dL (ref 13.0–17.0)
MCH: 24.1 pg — ABNORMAL LOW (ref 26.0–34.0)
MCHC: 31.7 g/dL (ref 30.0–36.0)
MCV: 76.2 fL — ABNORMAL LOW (ref 80.0–100.0)
Platelets: 247 10*3/uL (ref 150–400)
RBC: 5.8 MIL/uL (ref 4.22–5.81)
RDW: 16.9 % — ABNORMAL HIGH (ref 11.5–15.5)
WBC: 8.8 10*3/uL (ref 4.0–10.5)
nRBC: 0 % (ref 0.0–0.2)

## 2018-12-14 LAB — BASIC METABOLIC PANEL
Anion gap: 9 (ref 5–15)
BUN: 25 mg/dL — ABNORMAL HIGH (ref 6–20)
CO2: 24 mmol/L (ref 22–32)
Calcium: 9.6 mg/dL (ref 8.9–10.3)
Chloride: 106 mmol/L (ref 98–111)
Creatinine, Ser: 1.49 mg/dL — ABNORMAL HIGH (ref 0.61–1.24)
GFR calc Af Amer: 60 mL/min — ABNORMAL LOW (ref >60)
GFR calc non Af Amer: 52 mL/min — ABNORMAL LOW (ref >60)
Glucose, Bld: 151 mg/dL — ABNORMAL HIGH (ref 70–99)
Potassium: 3.9 mmol/L (ref 3.5–5.1)
Sodium: 139 mmol/L (ref 135–145)

## 2018-12-14 MED ORDER — METHOCARBAMOL 500 MG PO TABS
1000.0000 mg | ORAL_TABLET | Freq: Once | ORAL | Status: AC
  Administered 2018-12-14: 20:00:00 1000 mg via ORAL
  Filled 2018-12-14: qty 2

## 2018-12-14 MED ORDER — METHOCARBAMOL 500 MG PO TABS: 1000.0000 mg | ORAL_TABLET | Freq: Three times a day (TID) | ORAL | 0 refills | Status: DC | PRN

## 2018-12-14 MED ORDER — ACETAMINOPHEN 500 MG PO TABS
1000.0000 mg | ORAL_TABLET | Freq: Once | ORAL | Status: AC
  Administered 2018-12-14: 20:00:00 1000 mg via ORAL
  Filled 2018-12-14: qty 2

## 2018-12-14 MED ORDER — SODIUM CHLORIDE 0.9 % IV BOLUS
1000.0000 mL | Freq: Once | INTRAVENOUS | Status: AC
  Administered 2018-12-14: 1000 mL via INTRAVENOUS

## 2018-12-14 MED ORDER — SODIUM CHLORIDE 0.9% FLUSH: 3.0000 mL | Freq: Once | INTRAVENOUS | Status: DC

## 2018-12-14 NOTE — Telephone Encounter (Signed)
Noted  TMS 

## 2018-12-14 NOTE — ED Notes (Signed)
No answer from lobby for CT scan at this time.

## 2018-12-14 NOTE — Telephone Encounter (Signed)
Patient passed out and hit his nose and head. Wife is taking pt to UC or ED.  Just wanted to let PCP know. Unsure as to why he blacked out.
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
## 2018-12-27 ENCOUNTER — Other Ambulatory Visit: Payer: Self-pay | Admitting: Internal Medicine

## 2018-12-28 ENCOUNTER — Other Ambulatory Visit: Payer: Self-pay | Admitting: Internal Medicine

## 2018-12-28 ENCOUNTER — Other Ambulatory Visit: Payer: Self-pay

## 2018-12-28 DIAGNOSIS — M542 Cervicalgia: Secondary | ICD-10-CM

## 2018-12-28 DIAGNOSIS — I10 Essential (primary) hypertension: Secondary | ICD-10-CM

## 2018-12-28 DIAGNOSIS — N529 Male erectile dysfunction, unspecified: Secondary | ICD-10-CM

## 2018-12-28 DIAGNOSIS — N5231 Erectile dysfunction following radical prostatectomy: Secondary | ICD-10-CM

## 2018-12-28 DIAGNOSIS — E119 Type 2 diabetes mellitus without complications: Secondary | ICD-10-CM

## 2018-12-28 DIAGNOSIS — K219 Gastro-esophageal reflux disease without esophagitis: Secondary | ICD-10-CM

## 2018-12-28 DIAGNOSIS — G8929 Other chronic pain: Secondary | ICD-10-CM

## 2018-12-28 MED ORDER — JANUMET XR 50-1000 MG PO TB24: 1.0000 | ORAL_TABLET | Freq: Every day | ORAL | 3 refills | Status: DC

## 2018-12-28 MED ORDER — SILDENAFIL CITRATE 100 MG PO TABS: 100.0000 mg | ORAL_TABLET | Freq: Every day | ORAL | 11 refills | Status: DC | PRN

## 2018-12-28 MED ORDER — AMLODIPINE BESYLATE 5 MG PO TABS: 5.0000 mg | ORAL_TABLET | Freq: Every day | ORAL | 3 refills | Status: DC

## 2018-12-28 MED ORDER — PANTOPRAZOLE SODIUM 40 MG PO TBEC: 40.0000 mg | DELAYED_RELEASE_TABLET | Freq: Every day | ORAL | 3 refills | Status: DC

## 2018-12-28 MED ORDER — JANUMET XR 100-1000 MG PO TB24: 1.0000 | ORAL_TABLET | Freq: Every day | ORAL | 3 refills | Status: DC

## 2018-12-28 MED ORDER — TRAMADOL HCL 50 MG PO TABS: 50.0000 mg | ORAL_TABLET | Freq: Two times a day (BID) | ORAL | 0 refills | Status: AC | PRN

## 2018-12-28 MED ORDER — CANDESARTAN CILEXETIL 32 MG PO TABS: 16.0000 mg | ORAL_TABLET | Freq: Every day | ORAL | 3 refills | Status: DC

## 2019-01-08 ENCOUNTER — Other Ambulatory Visit: Payer: Self-pay | Admitting: Internal Medicine

## 2019-01-08 DIAGNOSIS — I10 Essential (primary) hypertension: Secondary | ICD-10-CM

## 2019-01-08 MED ORDER — CANDESARTAN CILEXETIL 32 MG PO TABS: 32.0000 mg | ORAL_TABLET | Freq: Every day | ORAL | 3 refills | Status: DC

## 2019-01-10 ENCOUNTER — Ambulatory Visit (AMBULATORY_SURGERY_CENTER): Payer: Self-pay | Admitting: *Deleted

## 2019-01-10 ENCOUNTER — Other Ambulatory Visit: Payer: Self-pay

## 2019-01-10 VITALS — Temp 97.1°F | Ht 75.0 in | Wt 290.0 lb

## 2019-01-10 DIAGNOSIS — Z86010 Personal history of colonic polyps: Secondary | ICD-10-CM

## 2019-01-10 MED ORDER — SUPREP BOWEL PREP KIT 17.5-3.13-1.6 GM/177ML PO SOLN: 1.0000 | Freq: Once | ORAL | 0 refills | Status: AC

## 2019-01-10 NOTE — Progress Notes (Signed)

## 2019-01-14 ENCOUNTER — Encounter: Payer: Self-pay | Admitting: Gastroenterology

## 2019-01-21 ENCOUNTER — Other Ambulatory Visit: Payer: Self-pay

## 2019-01-21 ENCOUNTER — Other Ambulatory Visit: Payer: Self-pay | Admitting: Internal Medicine

## 2019-01-21 ENCOUNTER — Telehealth: Payer: Self-pay

## 2019-01-21 DIAGNOSIS — I10 Essential (primary) hypertension: Secondary | ICD-10-CM

## 2019-01-21 MED ORDER — SPIRONOLACTONE 25 MG PO TABS: 12.5000 mg | ORAL_TABLET | Freq: Every day | ORAL | Status: DC

## 2019-01-21 MED ORDER — CHLORTHALIDONE 25 MG PO TABS: 12.5000 mg | ORAL_TABLET | Freq: Every day | ORAL | 3 refills | Status: DC

## 2019-01-21 NOTE — Telephone Encounter (Signed)
Patient stated his sugars are too low due to the higher dose of Janumet. He would like to drop it back down to 50-1000. Please advise.

## 2019-01-23 ENCOUNTER — Other Ambulatory Visit: Payer: Self-pay

## 2019-01-23 ENCOUNTER — Other Ambulatory Visit: Payer: Self-pay | Admitting: Internal Medicine

## 2019-01-23 DIAGNOSIS — E119 Type 2 diabetes mellitus without complications: Secondary | ICD-10-CM

## 2019-01-23 DIAGNOSIS — I10 Essential (primary) hypertension: Secondary | ICD-10-CM

## 2019-01-23 MED ORDER — JANUMET XR 50-1000 MG PO TB24: 1.0000 | ORAL_TABLET | Freq: Every day | ORAL | 3 refills | Status: DC

## 2019-01-23 MED ORDER — SPIRONOLACTONE 25 MG PO TABS: 12.5000 mg | ORAL_TABLET | Freq: Every day | ORAL | Status: DC

## 2019-01-23 MED ORDER — SPIRONOLACTONE 25 MG PO TABS: 12.5000 mg | ORAL_TABLET | Freq: Every day | ORAL | 3 refills | Status: DC

## 2019-01-23 NOTE — Telephone Encounter (Signed)
Sent 50-1000 xr qd instead

## 2019-01-25 ENCOUNTER — Other Ambulatory Visit: Payer: Self-pay | Admitting: Gastroenterology

## 2019-01-25 ENCOUNTER — Ambulatory Visit (AMBULATORY_SURGERY_CENTER): Payer: No Typology Code available for payment source | Admitting: Gastroenterology

## 2019-01-25 ENCOUNTER — Other Ambulatory Visit: Payer: Self-pay

## 2019-01-25 ENCOUNTER — Encounter: Payer: Self-pay | Admitting: Gastroenterology

## 2019-01-25 VITALS — BP 145/88 | HR 70 | Temp 97.7°F | Resp 14 | Ht 75.0 in | Wt 290.0 lb

## 2019-01-25 DIAGNOSIS — D123 Benign neoplasm of transverse colon: Secondary | ICD-10-CM | POA: Diagnosis not present

## 2019-01-25 DIAGNOSIS — Z86010 Personal history of colonic polyps: Secondary | ICD-10-CM

## 2019-01-25 DIAGNOSIS — D122 Benign neoplasm of ascending colon: Secondary | ICD-10-CM

## 2019-01-25 MED ORDER — SODIUM CHLORIDE 0.9 % IV SOLN: 500.0000 mL | Freq: Once | INTRAVENOUS | Status: DC

## 2019-01-25 NOTE — Progress Notes (Signed)
Called to room to assist during endoscopic procedure.  Patient ID and intended procedure confirmed with present staff. Received instructions for my participation in the procedure from the performing physician.  

## 2019-01-25 NOTE — Progress Notes (Signed)
Pt's states no medical or surgical changes since previsit or office visit. VS done by CW. Temp by Hiram Comber

## 2019-01-25 NOTE — Progress Notes (Signed)
Report to PACU, RN, vss, BBS= Clear.  

## 2019-01-25 NOTE — Op Note (Signed)
[facility] Patient Name: Nicholas Carr Procedure Date: 01/25/2019 9:35 AM MRN: [id number] Endoscopist: Milus Banister , MD Age: 58 Referring MD:  Date of Birth: January 15, 1961 Gender: Male Account #: [id number] Procedure:                Colonoscopy Indications:              High risk colon cancer surveillance: Personal                            history of colonic polyps; colonoscopy 2015 Dr.                            Sharlett Iles found two subCM adenomas Medicines:                Monitored Anesthesia Care Procedure:                Pre-Anesthesia Assessment:                           - Prior to the procedure, a History and Physical                            was performed, and patient medications and                            allergies were reviewed. The patient's tolerance of                            previous anesthesia was also reviewed. The risks                            and benefits of the procedure and the sedation                            options and risks were discussed with the patient.                            All questions were answered, and informed consent                            was obtained. Prior Anticoagulants: The patient has                            taken no previous anticoagulant or antiplatelet                            agents. ASA Grade Assessment: II - A patient with                            mild systemic disease. After reviewing the risks                            and benefits, the patient was deemed in  satisfactory condition to undergo the procedure.                           After obtaining informed consent, the colonoscope                            was passed under direct vision. Throughout the                            procedure, the patient's blood pressure, pulse, and                            oxygen saturations were monitored continuously. The                            Colonoscope was introduced  through the anus and                            advanced to the the cecum, identified by                            appendiceal orifice and ileocecal valve. The                            colonoscopy was performed without difficulty. The                            patient tolerated the procedure well. The quality                            of the bowel preparation was good. The ileocecal                            valve, appendiceal orifice, and rectum were                            photographed. Scope In: 9:40:28 AM Scope Out: 9:50:13 AM Scope Withdrawal Time: 0 hours 7 minutes 49 seconds  Total Procedure Duration: 0 hours 9 minutes 45 seconds  Findings:                 A 10 mm polyp was found in the ascending colon. The                            polyp was pedunculated. The polyp was removed with                            a cold snare. Resection and retrieval were complete.                           A 3 mm polyp was found in the transverse colon. The                            polyp was sessile. The polyp was  removed with a                            cold snare. Resection and retrieval were complete.                           The exam was otherwise without abnormality on                            direct and retroflexion views. Complications:            No immediate complications. Estimated blood loss:                            None. Estimated Blood Loss:     Estimated blood loss: none. Impression:               - One 10 mm polyp in the ascending colon, removed                            with a cold snare. Resected and retrieved.                           - One 3 mm polyp in the transverse colon, removed                            with a cold snare. Resected and retrieved.                           - The examination was otherwise normal on direct                            and retroflexion views. Recommendation:           - Patient has a contact number available for                             emergencies. The signs and symptoms of potential                            delayed complications were discussed with the                            patient. Return to normal activities tomorrow.                            Written discharge instructions were provided to the                            patient.                           - Resume previous diet.                           - Continue present medications.                           -  Await pathology results. Milus Banister, MD 01/25/2019 9:53:19 AM This report has been signed electronically.

## 2019-01-25 NOTE — Patient Instructions (Signed)
YOU HAD AN ENDOSCOPIC PROCEDURE TODAY AT THE [facility]:   Refer to the procedure report that was given to you for any specific questions about what was found during the examination.  If the procedure report does not answer your questions, please call your gastroenterologist to clarify.  If you requested that your care partner not be given the details of your procedure findings, then the procedure report has been included in a sealed envelope for you to review at your convenience later.  YOU SHOULD EXPECT: Some feelings of bloating in the abdomen. Passage of more gas than usual.  Walking can help get rid of the air that was put into your GI tract during the procedure and reduce the bloating. If you had a lower endoscopy (such as a colonoscopy or flexible sigmoidoscopy) you may notice spotting of blood in your stool or on the toilet paper. If you underwent a bowel prep for your procedure, you may not have a normal bowel movement for a few days.  Please Note:  You might notice some irritation and congestion in your nose or some drainage.  This is from the oxygen used during your procedure.  There is no need for concern and it should clear up in a day or so.  SYMPTOMS TO REPORT IMMEDIATELY:   Following lower endoscopy (colonoscopy or flexible sigmoidoscopy):  Excessive amounts of blood in the stool  Significant tenderness or worsening of abdominal pains  Swelling of the abdomen that is new, acute  Fever of 100F or higher   For urgent or emergent issues, a gastroenterologist can be reached at any hour by calling [phone].   DIET:  We do recommend a small meal at first, but then you may proceed to your regular diet.  Drink plenty of fluids but you should avoid alcoholic beverages for 24 hours.  MEDICATIONS: Continue present medications.  Please see handouts given to you by your recovery nurse.  ACTIVITY:  You should plan to take it easy for the rest of today and you should  NOT DRIVE or use heavy machinery until tomorrow (because of the sedation medicines used during the test).    FOLLOW UP: Our staff will call the number listed on your records 48-72 hours following your procedure to check on you and address any questions or concerns that you may have regarding the information given to you following your procedure. If we do not reach you, we will leave a message.  We will attempt to reach you two times.  During this call, we will ask if you have developed any symptoms of COVID 19. If you develop any symptoms (ie: fever, flu-like symptoms, shortness of breath, cough etc.) before then, please call [phone].  If you test positive for Covid 19 in the 2 weeks post procedure, please call and report this information to us.    If any biopsies were taken you will be contacted by phone or by letter within the next 1-3 weeks.  Please call us at [phone] if you have not heard about the biopsies in 3 weeks.   Thank you for allowing us to provide for your healthcare needs today.   SIGNATURES/CONFIDENTIALITY: You and/or your care partner have signed paperwork which will be entered into your electronic medical record.  These signatures attest to the fact that that the information above on your After Visit Summary has been reviewed and is understood.  Full responsibility of the confidentiality of this discharge information lies with you and/or   your care-partner. 

## 2019-01-29 ENCOUNTER — Telehealth: Payer: Self-pay | Admitting: *Deleted

## 2019-01-29 ENCOUNTER — Telehealth: Payer: Self-pay

## 2019-01-29 NOTE — Telephone Encounter (Signed)
Attempted to reach pt. With follow-up call following endoscopic procedure 01/25/2019.  LM on pt. Voice mail.  Will try to reach pt. Again later today.

## 2019-01-29 NOTE — Telephone Encounter (Signed)
  Follow up Call-  Call back number 01/25/2019  Post procedure Call Back phone  # [phone]  Permission to leave phone message Yes  Some recent data might be hidden     Patient questions:  Do you have a fever, pain , or abdominal swelling? No. Pain Score  0 *  Have you tolerated food without any problems? Yes.    Have you been able to return to your normal activities? Yes.    Do you have any questions about your discharge instructions: Diet   No. Medications  No. Follow up visit  No.  Do you have questions or concerns about your Care? No.  Actions: * If pain score is 4 or above: No action needed, pain <4.  1. Have you developed a fever since your procedure? no  2.   Have you had an respiratory symptoms (SOB or cough) since your procedure? no  3.   Have you tested positive for COVID 19 since your procedure no  4.   Have you had any family members/close contacts diagnosed with the COVID 19 since your procedure?  no   If yes to any of these questions please route to Joylene John, RN and Alphonsa Gin, Therapist, sports.

## 2019-02-01 ENCOUNTER — Encounter: Payer: Self-pay | Admitting: Gastroenterology

## 2019-02-22 ENCOUNTER — Other Ambulatory Visit: Payer: Self-pay | Admitting: Internal Medicine

## 2019-02-27 ENCOUNTER — Other Ambulatory Visit: Payer: Self-pay | Admitting: Internal Medicine

## 2019-02-27 DIAGNOSIS — I10 Essential (primary) hypertension: Secondary | ICD-10-CM

## 2019-02-27 MED ORDER — CANDESARTAN CILEXETIL 32 MG PO TABS: 32.0000 mg | ORAL_TABLET | Freq: Every day | ORAL | 3 refills | Status: DC

## 2019-03-08 ENCOUNTER — Telehealth: Payer: Self-pay | Admitting: Pulmonary Disease

## 2019-03-08 NOTE — Telephone Encounter (Signed)
Left message for patient's wife to call back.   Attempted to get a download from [facility] but the website is currently down.

## 2019-03-08 NOTE — Telephone Encounter (Signed)
Patient's wife is returning phone call.  Phone number is [phone].

## 2019-03-08 NOTE — Telephone Encounter (Signed)
[facility] is back up and running. Was able to print a download for pt. Called and spoke with pt's wife Lorriane Shire letting her know this. vanessa said she would be by office Monday to pick it up. Download placed in file cabinet up front for pt. Nothing further needed.

## 2019-03-08 NOTE — Telephone Encounter (Signed)
Pt's wife aware that website was down.  She stated they need the report by Wed 12/16 and she will pick up.  May leave message on her voicemail when it is ready.

## 2019-03-08 NOTE — Telephone Encounter (Signed)
Called went straight to VM.   Please let someone know in triage if she calls back. Thanks.

## 2019-03-12 ENCOUNTER — Other Ambulatory Visit: Payer: Self-pay

## 2019-03-15 ENCOUNTER — Other Ambulatory Visit: Payer: Self-pay

## 2019-03-15 ENCOUNTER — Encounter: Payer: Self-pay | Admitting: Internal Medicine

## 2019-03-15 ENCOUNTER — Ambulatory Visit (INDEPENDENT_AMBULATORY_CARE_PROVIDER_SITE_OTHER): Payer: No Typology Code available for payment source | Admitting: Internal Medicine

## 2019-03-15 VITALS — BP 128/78 | HR 85 | Temp 97.3°F | Ht 75.0 in | Wt 286.0 lb

## 2019-03-15 DIAGNOSIS — Z0000 Encounter for general adult medical examination without abnormal findings: Secondary | ICD-10-CM | POA: Diagnosis not present

## 2019-03-15 DIAGNOSIS — M542 Cervicalgia: Secondary | ICD-10-CM

## 2019-03-15 DIAGNOSIS — I10 Essential (primary) hypertension: Secondary | ICD-10-CM

## 2019-03-15 DIAGNOSIS — Z23 Encounter for immunization: Secondary | ICD-10-CM

## 2019-03-15 DIAGNOSIS — N179 Acute kidney failure, unspecified: Secondary | ICD-10-CM

## 2019-03-15 DIAGNOSIS — E119 Type 2 diabetes mellitus without complications: Secondary | ICD-10-CM

## 2019-03-15 DIAGNOSIS — B353 Tinea pedis: Secondary | ICD-10-CM

## 2019-03-15 DIAGNOSIS — R002 Palpitations: Secondary | ICD-10-CM

## 2019-03-15 DIAGNOSIS — M545 Low back pain, unspecified: Secondary | ICD-10-CM

## 2019-03-15 LAB — COMPREHENSIVE METABOLIC PANEL
ALT: 22 U/L (ref 0–53)
AST: 19 U/L (ref 0–37)
Albumin: 4.3 g/dL (ref 3.5–5.2)
Alkaline Phosphatase: 72 U/L (ref 39–117)
BUN: 20 mg/dL (ref 6–23)
CO2: 29 mEq/L (ref 19–32)
Calcium: 9.6 mg/dL (ref 8.4–10.5)
Chloride: 103 mEq/L (ref 96–112)
Creatinine, Ser: 1.38 mg/dL (ref 0.40–1.50)
GFR: 64.43 mL/min (ref >60.00)
Glucose, Bld: 110 mg/dL — ABNORMAL HIGH (ref 70–99)
Potassium: 4.2 mEq/L (ref 3.5–5.1)
Sodium: 137 mEq/L (ref 135–145)
Total Bilirubin: 0.5 mg/dL (ref 0.2–1.2)
Total Protein: 7.6 g/dL (ref 6.0–8.3)

## 2019-03-15 LAB — LIPID PANEL
Cholesterol: 159 mg/dL (ref 0–200)
HDL: 39.5 mg/dL (ref >39.00)
LDL Cholesterol: 88 mg/dL (ref 0–99)
NonHDL: 119.68
Total CHOL/HDL Ratio: 4
Triglycerides: 156 mg/dL — ABNORMAL HIGH (ref 0.0–149.0)
VLDL: 31.2 mg/dL (ref 0.0–40.0)

## 2019-03-15 LAB — HEMOGLOBIN A1C: Hgb A1c MFr Bld: 6 % (ref 4.6–6.5)

## 2019-03-15 LAB — HM DIABETES EYE EXAM
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
## 2019-03-15 NOTE — Patient Instructions (Addendum)
Goldbond antifungal powder or Zeasorb AF powder  Lotriman or Clotrimazole cream    Athlete's Foot  Athlete's foot (tinea pedis) is a fungal infection of the skin on your feet. It often occurs on the skin that is between or underneath the toes. It can also occur on the soles of your feet. The infection can spread from person to person (is contagious). It can also spread when a person's bare feet come in contact with the fungus on shower floors or on items such as shoes. What are the causes? This condition is caused by a fungus that grows in warm, moist places. You can get athlete's foot by sharing shoes, shower stalls, towels, and wet floors with someone who is infected. Not washing your feet or changing your socks often enough can also lead to athlete's foot. What increases the risk? This condition is more likely to develop in:  Men.  People who have a weak body defense system (immune system).  People who have diabetes.  People who use public showers, such as at a gym.  People who wear heavy-duty shoes, such as Environmental manager.  Seasons with warm, humid weather. What are the signs or symptoms? Symptoms of this condition include:  Itchy areas between your toes or on the soles of your feet.  White, flaky, or scaly areas between your toes or on the soles of your feet.  Very itchy small blisters between your toes or on the soles of your feet.  Small cuts in your skin. These cuts can become infected.  Thick or discolored toenails. How is this diagnosed? This condition may be diagnosed with a physical exam and a review of your medical history. Your health care provider may also take a skin or toenail sample to examine under a microscope. How is this treated? This condition is treated with antifungal medicines. These may be applied as powders, ointments, or creams. In severe cases, an oral antifungal medicine may be given. Follow these instructions at  home: Medicines  Apply or take over-the-counter and prescription medicines only as told by your health care provider.  Apply your antifungal medicine as told by your health care provider. Do not stop using the antifungal even if your condition improves. Foot care  Do not scratch your feet.  Keep your feet dry: ? Wear cotton or wool socks. Change your socks every day or if they become wet. ? Wear shoes that allow air to flow, such as sandals or canvas tennis shoes.  Wash and dry your feet, including the area between your toes. Also, wash and dry your feet: ? Every day or as told by your health care provider. ? After exercising. General instructions  Do not let others use towels, shoes, nail clippers, or other personal items that touch your feet.  Protect your feet by wearing sandals in wet areas, such as locker rooms and shared showers.  Keep all follow-up visits as told by your health care provider. This is important.  If you have diabetes, keep your blood sugar under control. Contact a health care provider if:  You have a fever.  You have swelling, soreness, warmth, or redness in your foot.  Your feet are not getting better with treatment.  Your symptoms get worse.  You have new symptoms. Summary  Athlete's foot (tinea pedis) is a fungal infection of the skin on your feet. It often occurs on skin that is between or underneath the toes.  This condition is caused by a fungus  that grows in warm, moist places.  Symptoms include white, flaky, or scaly areas between your toes or on the soles of your feet.  This condition is treated with antifungal medicines.  Keep your feet clean. Always dry them thoroughly. This information is not intended to replace advice given to you by your health care provider. Make sure you discuss any questions you have with your health care provider. Document Released: 03/11/2000 Document Revised: 03/09/2017 Document Reviewed: 01/02/2017 Elsevier  Patient Education  2020 [employer].  Neck Exercises Ask your health care provider which exercises are safe for you. Do exercises exactly as told by your health care provider and adjust them as directed. It is normal to feel mild stretching, pulling, tightness, or discomfort as you do these exercises. Stop right away if you feel sudden pain or your pain gets worse. Do not begin these exercises until told by your health care provider. Neck exercises can be important for many reasons. They can improve strength and maintain flexibility in your neck, which will help your upper back and prevent neck pain. Stretching exercises Rotation neck stretching  1. Sit in a chair or stand up. 2. Place your feet flat on the floor, shoulder width apart. 3. Slowly turn your head (rotate) to the right until a slight stretch is felt. Turn it all the way to the right so you can look over your right shoulder. Do not tilt or tip your head. 4. Hold this position for 10-30 seconds. 5. Slowly turn your head (rotate) to the left until a slight stretch is felt. Turn it all the way to the left so you can look over your left shoulder. Do not tilt or tip your head. 6. Hold this position for 10-30 seconds. Repeat __________ times. Complete this exercise __________ times a day. Neck retraction 1. Sit in a sturdy chair or stand up. 2. Look straight ahead. Do not bend your neck. 3. Use your fingers to push your chin backward (retraction). Do not bend your neck for this movement. Continue to face straight ahead. If you are doing the exercise properly, you will feel a slight sensation in your throat and a stretch at the back of your neck. 4. Hold the stretch for 1-2 seconds. Repeat __________ times. Complete this exercise __________ times a day. Strengthening exercises Neck press 1. Lie on your back on a firm bed or on the floor with a pillow under your head. 2. Use your neck muscles to push your head down on the pillow and  straighten your spine. 3. Hold the position as well as you can. Keep your head facing up (in a neutral position) and your chin tucked. 4. Slowly count to 5 while holding this position. Repeat __________ times. Complete this exercise __________ times a day. Isometrics These are exercises in which you strengthen the muscles in your neck while keeping your neck still (isometrics). 1. Sit in a supportive chair and place your hand on your forehead. 2. Keep your head and face facing straight ahead. Do not flex or extend your neck while doing isometrics. 3. Push forward with your head and neck while pushing back with your hand. Hold for 10 seconds. 4. Do the sequence again, this time putting your hand against the back of your head. Use your head and neck to push backward against the hand pressure. 5. Finally, do the same exercise on either side of your head, pushing sideways against the pressure of your hand. Repeat __________ times. Complete this exercise  __________ times a day. Prone head lifts 1. Lie face-down (prone position), resting on your elbows so that your chest and upper back are raised. 2. Start with your head facing downward, near your chest. Position your chin either on or near your chest. 3. Slowly lift your head upward. Lift until you are looking straight ahead. Then continue lifting your head as far back as you can comfortably stretch. 4. Hold your head up for 5 seconds. Then slowly lower it to your starting position. Repeat __________ times. Complete this exercise __________ times a day. Supine head lifts 1. Lie on your back (supine position), bending your knees to point to the ceiling and keeping your feet flat on the floor. 2. Lift your head slowly off the floor, raising your chin toward your chest. 3. Hold for 5 seconds. Repeat __________ times. Complete this exercise __________ times a day. Scapular retraction 1. Stand with your arms at your sides. Look straight  ahead. 2. Slowly pull both shoulders (scapulae) backward and downward (retraction) until you feel a stretch between your shoulder blades in your upper back. 3. Hold for 10-30 seconds. 4. Relax and repeat. Repeat __________ times. Complete this exercise __________ times a day. Contact a health care provider if:  Your neck pain or discomfort gets much worse when you do an exercise.  Your neck pain or discomfort does not improve within 2 hours after you exercise. If you have any of these problems, stop exercising right away. Do not do the exercises again unless your health care provider says that you can. Get help right away if:  You develop sudden, severe neck pain. If this happens, stop exercising right away. Do not do the exercises again unless your health care provider says that you can. This information is not intended to replace advice given to you by your health care provider. Make sure you discuss any questions you have with your health care provider. Document Released: 02/23/2015 Document Revised: 01/10/2018 Document Reviewed: 01/10/2018 Elsevier Patient Education  2020 [employer].  Back Exercises The following exercises strengthen the muscles that help to support the trunk and back. They also help to keep the lower back flexible. Doing these exercises can help to prevent back pain or lessen existing pain.  If you have back pain or discomfort, try doing these exercises 2-3 times each day or as told by your health care provider.  As your pain improves, do them once each day, but increase the number of times that you repeat the steps for each exercise (do more repetitions).  To prevent the recurrence of back pain, continue to do these exercises once each day or as told by your health care provider. Do exercises exactly as told by your health care provider and adjust them as directed. It is normal to feel mild stretching, pulling, tightness, or discomfort as you do these exercises,  but you should stop right away if you feel sudden pain or your pain gets worse. Exercises Single knee to chest Repeat these steps 3-5 times for each leg: 1. Lie on your back on a firm bed or the floor with your legs extended. 2. Bring one knee to your chest. Your other leg should stay extended and in contact with the floor. 3. Hold your knee in place by grabbing your knee or thigh with both hands and hold. 4. Pull on your knee until you feel a gentle stretch in your lower back or buttocks. 5. Hold the stretch for 10-30 seconds. 6. Slowly  release and straighten your leg. Pelvic tilt Repeat these steps 5-10 times: 1. Lie on your back on a firm bed or the floor with your legs extended. 2. Bend your knees so they are pointing toward the ceiling and your feet are flat on the floor. 3. Tighten your lower abdominal muscles to press your lower back against the floor. This motion will tilt your pelvis so your tailbone points up toward the ceiling instead of pointing to your feet or the floor. 4. With gentle tension and even breathing, hold this position for 5-10 seconds. Cat-cow Repeat these steps until your lower back becomes more flexible: 1. Get into a hands-and-knees position on a firm surface. Keep your hands under your shoulders, and keep your knees under your hips. You may place padding under your knees for comfort. 2. Let your head hang down toward your chest. Contract your abdominal muscles and point your tailbone toward the floor so your lower back becomes rounded like the back of a cat. 3. Hold this position for 5 seconds. 4. Slowly lift your head, let your abdominal muscles relax and point your tailbone up toward the ceiling so your back forms a sagging arch like the back of a cow. 5. Hold this position for 5 seconds.  Press-ups Repeat these steps 5-10 times: 1. Lie on your abdomen (face-down) on the floor. 2. Place your palms near your head, about shoulder-width apart. 3. Keeping your  back as relaxed as possible and keeping your hips on the floor, slowly straighten your arms to raise the top half of your body and lift your shoulders. Do not use your back muscles to raise your upper torso. You may adjust the placement of your hands to make yourself more comfortable. 4. Hold this position for 5 seconds while you keep your back relaxed. 5. Slowly return to lying flat on the floor.  Bridges Repeat these steps 10 times: 1. Lie on your back on a firm surface. 2. Bend your knees so they are pointing toward the ceiling and your feet are flat on the floor. Your arms should be flat at your sides, next to your body. 3. Tighten your buttocks muscles and lift your buttocks off the floor until your waist is at almost the same height as your knees. You should feel the muscles working in your buttocks and the back of your thighs. If you do not feel these muscles, slide your feet 1-2 inches farther away from your buttocks. 4. Hold this position for 3-5 seconds. 5. Slowly lower your hips to the starting position, and allow your buttocks muscles to relax completely. If this exercise is too easy, try doing it with your arms crossed over your chest. Abdominal crunches Repeat these steps 5-10 times: 1. Lie on your back on a firm bed or the floor with your legs extended. 2. Bend your knees so they are pointing toward the ceiling and your feet are flat on the floor. 3. Cross your arms over your chest. 4. Tip your chin slightly toward your chest without bending your neck. 5. Tighten your abdominal muscles and slowly raise your trunk (torso) high enough to lift your shoulder blades a tiny bit off the floor. Avoid raising your torso higher than that because it can put too much stress on your low back and does not help to strengthen your abdominal muscles. 6. Slowly return to your starting position. Back lifts Repeat these steps 5-10 times: 1. Lie on your abdomen (face-down) with your arms at  your  sides, and rest your forehead on the floor. 2. Tighten the muscles in your legs and your buttocks. 3. Slowly lift your chest off the floor while you keep your hips pressed to the floor. Keep the back of your head in line with the curve in your back. Your eyes should be looking at the floor. 4. Hold this position for 3-5 seconds. 5. Slowly return to your starting position. Contact a health care provider if:  Your back pain or discomfort gets much worse when you do an exercise.  Your worsening back pain or discomfort does not lessen within 2 hours after you exercise. If you have any of these problems, stop doing these exercises right away. Do not do them again unless your health care provider says that you can. Get help right away if:  You develop sudden, severe back pain. If this happens, stop doing the exercises right away. Do not do them again unless your health care provider says that you can. This information is not intended to replace advice given to you by your health care provider. Make sure you discuss any questions you have with your health care provider. Document Released: 04/21/2004 Document Revised: 07/19/2018 Document Reviewed: 12/14/2017 Elsevier Patient Education  2020 [employer].

## 2019-03-20 ENCOUNTER — Other Ambulatory Visit: Payer: Self-pay

## 2019-03-20 DIAGNOSIS — K219 Gastro-esophageal reflux disease without esophagitis: Secondary | ICD-10-CM

## 2019-03-20 MED ORDER — PANTOPRAZOLE SODIUM 40 MG PO TBEC: 40.0000 mg | DELAYED_RELEASE_TABLET | Freq: Every day | ORAL | 3 refills | Status: DC

## 2019-03-20 MED ORDER — LOVASTATIN 40 MG PO TABS: 40.0000 mg | ORAL_TABLET | Freq: Every day | ORAL | 0 refills | Status: DC

## 2019-04-04 ENCOUNTER — Other Ambulatory Visit: Payer: Self-pay | Admitting: Internal Medicine

## 2019-04-04 DIAGNOSIS — E119 Type 2 diabetes mellitus without complications: Secondary | ICD-10-CM

## 2019-04-04 MED ORDER — JANUMET XR 100-1000 MG PO TB24: 1.0000 | ORAL_TABLET | Freq: Every day | ORAL | 3 refills | Status: DC

## 2019-04-09 ENCOUNTER — Other Ambulatory Visit: Payer: Self-pay

## 2019-04-09 DIAGNOSIS — E119 Type 2 diabetes mellitus without complications: Secondary | ICD-10-CM

## 2019-04-09 MED ORDER — JANUMET XR 100-1000 MG PO TB24: 1.0000 | ORAL_TABLET | Freq: Every day | ORAL | 3 refills | Status: DC

## 2019-04-17 ENCOUNTER — Other Ambulatory Visit: Payer: Self-pay | Admitting: Urology

## 2019-04-19 ENCOUNTER — Ambulatory Visit: Payer: No Typology Code available for payment source | Admitting: Internal Medicine

## 2019-05-22 ENCOUNTER — Encounter: Payer: Self-pay | Admitting: Internal Medicine

## 2019-05-22 ENCOUNTER — Other Ambulatory Visit: Payer: Self-pay | Admitting: Internal Medicine

## 2019-05-22 DIAGNOSIS — E119 Type 2 diabetes mellitus without complications: Secondary | ICD-10-CM

## 2019-05-22 MED ORDER — BLOOD GLUCOSE METER KIT: PACK | 0 refills | Status: DC

## 2019-06-03 ENCOUNTER — Other Ambulatory Visit: Payer: Self-pay | Admitting: Internal Medicine

## 2019-06-03 DIAGNOSIS — E119 Type 2 diabetes mellitus without complications: Secondary | ICD-10-CM

## 2019-06-03 MED ORDER — BLOOD GLUCOSE METER KIT: PACK | 0 refills | Status: DC

## 2019-06-03 NOTE — Telephone Encounter (Signed)
RX would not go through to [employer] machine despite multiple attempts.   Re-faxed electronically through epic. Will also re-attempt to send in medication through our physical fax machine.

## 2019-06-19 ENCOUNTER — Other Ambulatory Visit: Payer: Self-pay | Admitting: Internal Medicine

## 2019-06-19 DIAGNOSIS — E119 Type 2 diabetes mellitus without complications: Secondary | ICD-10-CM

## 2019-06-19 MED ORDER — SITAGLIPTIN PHOSPHATE 100 MG PO TABS: 100.0000 mg | ORAL_TABLET | Freq: Every day | ORAL | 3 refills | Status: DC

## 2019-06-19 MED ORDER — METFORMIN HCL ER 500 MG PO TB24: 1000.0000 mg | ORAL_TABLET | Freq: Every day | ORAL | 3 refills | Status: DC

## 2019-07-05 ENCOUNTER — Other Ambulatory Visit: Payer: Self-pay | Admitting: Internal Medicine

## 2019-07-05 MED ORDER — LOVASTATIN 40 MG PO TABS: 40.0000 mg | ORAL_TABLET | Freq: Every day | ORAL | 1 refills | Status: DC

## 2019-07-25 NOTE — Addendum Note (Signed)
Addended by: Orland Mustard on: 07/25/2019 08:50 AM   Modules accepted: Orders

## 2019-07-26 ENCOUNTER — Other Ambulatory Visit: Payer: Self-pay

## 2019-07-26 ENCOUNTER — Ambulatory Visit (INDEPENDENT_AMBULATORY_CARE_PROVIDER_SITE_OTHER): Payer: No Typology Code available for payment source | Admitting: Cardiology

## 2019-07-26 ENCOUNTER — Ambulatory Visit (INDEPENDENT_AMBULATORY_CARE_PROVIDER_SITE_OTHER): Payer: No Typology Code available for payment source

## 2019-07-26 ENCOUNTER — Encounter: Payer: Self-pay | Admitting: Cardiology

## 2019-07-26 VITALS — BP 130/90 | HR 73 | Ht 75.0 in | Wt 297.2 lb

## 2019-07-26 DIAGNOSIS — I499 Cardiac arrhythmia, unspecified: Secondary | ICD-10-CM | POA: Diagnosis not present

## 2019-07-26 DIAGNOSIS — E7800 Pure hypercholesterolemia, unspecified: Secondary | ICD-10-CM

## 2019-07-26 DIAGNOSIS — I10 Essential (primary) hypertension: Secondary | ICD-10-CM | POA: Diagnosis not present

## 2019-07-26 DIAGNOSIS — R002 Palpitations: Secondary | ICD-10-CM | POA: Diagnosis not present

## 2019-07-26 NOTE — Patient Instructions (Signed)
Medication Instructions:  - Your physician recommends that you continue on your current medications as directed. Please refer to the Current Medication list given to you today.  *If you need a refill on your cardiac medications before your next appointment, please call your pharmacy*   Lab Work: - none ordered  If you have labs (blood work) drawn today and your tests are completely normal, you will receive your results only by: Marland Kitchen MyChart Message (if you have MyChart) OR . A paper copy in the mail If you have any lab test that is abnormal or we need to change your treatment, we will call you to review the results.   Testing/Procedures: - Your physician has recommended that you wear a 14 day Zio (heart) monitor- placed in office today (remove 08/09/19). This monitor is a medical device that records the heart's electrical activity. Doctors most often use these monitors to diagnose arrhythmias. Arrhythmias are problems with the speed or rhythm of the heartbeat. The monitor is a small device applied to your chest. You can wear one while you do your normal daily activities. While wearing this monitor if you have any symptoms to push the button and record what you felt. Once you have worn this monitor for the period of time provider prescribed (Usually 14 days), you will return the monitor device in the postage paid box. Once it is returned they will download the data collected and provide [place] with a report which the provider will then review and we will call you with those results. Important tips:  1. Avoid showering during the first 24 hours of wearing the monitor. 2. Avoid excessive sweating to help maximize wear time. 3. Do not submerge the device, no hot tubs, and no swimming pools. 4. Keep any lotions or oils away from the patch. 5. After 24 hours you may shower with the patch on. Take brief showers with your back facing the shower head.  6. Do not remove patch once it has been placed because  that will interrupt data and decrease adhesive wear time. 7. Push the button when you have any symptoms and write down what you were feeling. 8. Once you have completed wearing your monitor, remove and place into box which has postage paid and place in your outgoing mailbox.  9. If for some reason you have misplaced your box then call our office and we can provide another box and/or mail it off for you.       Follow-Up: At [facility], you and your health needs are our priority.  As part of our continuing mission to provide you with exceptional heart care, we have created designated Provider Care Teams.  These Care Teams include your primary Cardiologist (physician) and Advanced Practice Providers (APPs -  Physician Assistants and Nurse Practitioners) who all work together to provide you with the care you need, when you need it.  We recommend signing up for the patient portal called "MyChart".  Sign up information is provided on this After Visit Summary.  MyChart is used to connect with patients for Virtual Visits (Telemedicine).  Patients are able to view lab/test results, encounter notes, upcoming appointments, etc.  Non-urgent messages can be sent to your provider as well.   To learn more about what you can do with MyChart, go to [email or web address].    Your next appointment:   1 month  The format for your next appointment:   In Person  Provider:   Kate Sable, MD   Other  Instructions N/a
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
## 2019-08-29 ENCOUNTER — Ambulatory Visit: Payer: No Typology Code available for payment source | Admitting: Cardiology

## 2019-08-29 ENCOUNTER — Encounter: Payer: Self-pay | Admitting: Cardiology

## 2019-08-29 ENCOUNTER — Other Ambulatory Visit: Payer: Self-pay

## 2019-08-29 VITALS — BP 144/88 | HR 76 | Ht 75.0 in | Wt 300.0 lb

## 2019-08-29 DIAGNOSIS — I10 Essential (primary) hypertension: Secondary | ICD-10-CM

## 2019-08-29 DIAGNOSIS — K219 Gastro-esophageal reflux disease without esophagitis: Secondary | ICD-10-CM

## 2019-08-29 DIAGNOSIS — I493 Ventricular premature depolarization: Secondary | ICD-10-CM | POA: Diagnosis not present

## 2019-08-29 DIAGNOSIS — E7800 Pure hypercholesterolemia, unspecified: Secondary | ICD-10-CM

## 2019-08-29 MED ORDER — PANTOPRAZOLE SODIUM 40 MG PO TBEC: 40.0000 mg | DELAYED_RELEASE_TABLET | Freq: Two times a day (BID) | ORAL | 3 refills | Status: DC

## 2019-08-29 MED ORDER — METOPROLOL SUCCINATE ER 50 MG PO TB24
50.0000 mg | ORAL_TABLET | Freq: Every day | ORAL | 3 refills | Status: DC
Start: 2019-08-29 — End: 2019-10-25
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
## 2019-08-29 NOTE — Patient Instructions (Signed)
Medication Instructions:  Your physician has recommended you make the following change in your medication:   1.   STOP COREG 2.   START TOPROL XL 50mg , 1 tablet by mouth daily. 3.   INCREASE your Protonix 40mg  to 1 tablet by mouth two times a day.   *If you need a refill on your cardiac medications before your next appointment, please call your pharmacy*   Lab Work: None Ordered. If you have labs (blood work) drawn today and your tests are completely normal, you will receive your results only by: Marland Kitchen MyChart Message (if you have MyChart) OR . A paper copy in the mail If you have any lab test that is abnormal or we need to change your treatment, we will call you to review the results.   Testing/Procedures: None Ordered.   Follow-Up: At [facility], you and your health needs are our priority.  As part of our continuing mission to provide you with exceptional heart care, we have created designated Provider Care Teams.  These Care Teams include your primary Cardiologist (physician) and Advanced Practice Providers (APPs -  Physician Assistants and Nurse Practitioners) who all work together to provide you with the care you need, when you need it.  We recommend signing up for the patient portal called "MyChart".  Sign up information is provided on this After Visit Summary.  MyChart is used to connect with patients for Virtual Visits (Telemedicine).  Patients are able to view lab/test results, encounter notes, upcoming appointments, etc.  Non-urgent messages can be sent to your provider as well.   To learn more about what you can do with MyChart, go to [email or web address].    Your next appointment:   1 month(s)  The format for your next appointment:   In Person  Provider:   Kate Sable, MD   Other Instructions  Metoprolol Extended-Release Tablets What is this medicine? METOPROLOL (me TOE proe lole) is a beta blocker. It decreases the amount of work your heart has to do  and helps your heart beat regularly. It treats high blood pressure and/or prevent chest pain (also called angina). It also treats heart failure. This medicine may be used for other purposes; ask your health care provider or pharmacist if you have questions. COMMON BRAND NAME(S): toprol, Toprol XL What should I tell my health care provider before I take this medicine? They need to know if you have any of these conditions:  diabetes  heart or vessel disease like slow heart rate, worsening heart failure, heart block, sick sinus syndrome or Raynaud's disease  kidney disease  liver disease  lung or breathing disease, like asthma or emphysema  pheochromocytoma  thyroid disease  an unusual or allergic reaction to metoprolol, other beta-blockers, medicines, foods, dyes, or preservatives  pregnant or trying to get pregnant  breast-feeding How should I use this medicine? Take this drug by mouth. Take it as directed on the prescription label at the same time every day. Take it with food. You may cut the tablet in half if it is scored (has a line in the middle of it). This may help you swallow the tablet if the whole tablet is too big. Be sure to take both halves. Do not take just one-half of the tablet. Keep taking it unless your health care provider tells you to stop. Talk to your health care provider about the use of this drug in children. While it may be prescribed for children as young as 6 for selected conditions,  precautions do apply. Overdosage: If you think you have taken too much of this medicine contact a poison control center or emergency room at once. NOTE: This medicine is only for you. Do not share this medicine with others. What if I miss a dose? If you miss a dose, take it as soon as you can. If it is almost time for your next dose, take only that dose. Do not take double or extra doses. What may interact with this medicine? This medicine may interact with the following  medications:  certain medicines for blood pressure, heart disease, irregular heart beat  certain medicines for depression, like monoamine oxidase (MAO) inhibitors, fluoxetine, or paroxetine  clonidine  dobutamine  epinephrine  isoproterenol  reserpine This list may not describe all possible interactions. Give your health care provider a list of all the medicines, herbs, non-prescription drugs, or dietary supplements you use. Also tell them if you smoke, drink alcohol, or use illegal drugs. Some items may interact with your medicine. What should I watch for while using this medicine? Visit your doctor or health care professional for regular check ups. Contact your doctor right away if your symptoms worsen. Check your blood pressure and pulse rate regularly. Ask your health care professional what your blood pressure and pulse rate should be, and when you should contact them. You may get drowsy or dizzy. Do not drive, use machinery, or do anything that needs mental alertness until you know how this medicine affects you. Do not sit or stand up quickly, especially if you are an older patient. This reduces the risk of dizzy or fainting spells. Contact your doctor if these symptoms continue. Alcohol may interfere with the effect of this medicine. Avoid alcoholic drinks. This medicine may increase blood sugar. Ask your healthcare provider if changes in diet or medicines are needed if you have diabetes. What side effects may I notice from receiving this medicine? Side effects that you should report to your doctor or health care professional as soon as possible:  allergic reactions like skin rash, itching or hives  cold or numb hands or feet  depression  difficulty breathing  faint  fever with sore throat  irregular heartbeat, chest pain  rapid weight gain   signs and symptoms of high blood sugar such as being more thirsty or hungry or having to urinate more than normal. You may also  feel very tired or have blurry vision.  swollen legs or ankles Side effects that usually do not require medical attention (report to your doctor or health care professional if they continue or are bothersome):  anxiety or nervousness  change in sex drive or performance  dry skin  headache  nightmares or trouble sleeping  short term memory loss  stomach upset or diarrhea This list may not describe all possible side effects. Call your doctor for medical advice about side effects. You may report side effects to FDA at 1-800-FDA-1088. Where should I keep my medicine? Keep out of the reach of children and pets. Store at room temperature between 20 and 25 degrees C (68 and 77 degrees F). Throw away any unused drug after the expiration date. NOTE: This sheet is a summary. It may not cover all possible information. If you have questions about this medicine, talk to your doctor, pharmacist, or health care provider.  2020 Elsevier/Gold Standard (2018-10-25 18:23:00)

## 2019-09-10 ENCOUNTER — Other Ambulatory Visit: Payer: Self-pay | Admitting: Internal Medicine

## 2019-09-10 DIAGNOSIS — K219 Gastro-esophageal reflux disease without esophagitis: Secondary | ICD-10-CM

## 2019-09-10 MED ORDER — PANTOPRAZOLE SODIUM 40 MG PO TBEC: 40.0000 mg | DELAYED_RELEASE_TABLET | Freq: Two times a day (BID) | ORAL | 3 refills | Status: DC

## 2019-09-27 ENCOUNTER — Other Ambulatory Visit: Payer: Self-pay

## 2019-09-27 ENCOUNTER — Telehealth (INDEPENDENT_AMBULATORY_CARE_PROVIDER_SITE_OTHER): Payer: No Typology Code available for payment source | Admitting: Internal Medicine

## 2019-09-27 ENCOUNTER — Encounter: Payer: Self-pay | Admitting: Internal Medicine

## 2019-09-27 VITALS — BP 144/90 | HR 63 | Ht 75.0 in | Wt 294.0 lb

## 2019-09-27 DIAGNOSIS — E669 Obesity, unspecified: Secondary | ICD-10-CM

## 2019-09-27 DIAGNOSIS — Z1329 Encounter for screening for other suspected endocrine disorder: Secondary | ICD-10-CM | POA: Diagnosis not present

## 2019-09-27 DIAGNOSIS — I10 Essential (primary) hypertension: Secondary | ICD-10-CM

## 2019-09-27 DIAGNOSIS — E782 Mixed hyperlipidemia: Secondary | ICD-10-CM | POA: Diagnosis not present

## 2019-09-27 DIAGNOSIS — E1159 Type 2 diabetes mellitus with other circulatory complications: Secondary | ICD-10-CM | POA: Diagnosis not present

## 2019-09-27 DIAGNOSIS — I152 Hypertension secondary to endocrine disorders: Secondary | ICD-10-CM

## 2019-09-27 NOTE — Progress Notes (Signed)
Patient flagged: Current status:  PATIENT IS OVERDUE FOR BMI FOLLOW UP PLAN BMI is estimated to be 36.7 based on the last recorded weight and height
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
## 2019-10-01 ENCOUNTER — Encounter: Payer: Self-pay | Admitting: Cardiology

## 2019-10-01 ENCOUNTER — Ambulatory Visit: Payer: No Typology Code available for payment source | Admitting: Cardiology

## 2019-10-01 ENCOUNTER — Other Ambulatory Visit: Payer: Self-pay

## 2019-10-01 VITALS — BP 118/78 | HR 71 | Ht 75.0 in | Wt 299.2 lb

## 2019-10-01 DIAGNOSIS — I493 Ventricular premature depolarization: Secondary | ICD-10-CM | POA: Diagnosis not present

## 2019-10-01 DIAGNOSIS — K219 Gastro-esophageal reflux disease without esophagitis: Secondary | ICD-10-CM | POA: Diagnosis not present

## 2019-10-01 DIAGNOSIS — E7800 Pure hypercholesterolemia, unspecified: Secondary | ICD-10-CM | POA: Diagnosis not present

## 2019-10-01 DIAGNOSIS — I10 Essential (primary) hypertension: Secondary | ICD-10-CM | POA: Diagnosis not present

## 2019-10-01 MED ORDER — CHLORTHALIDONE 25 MG PO TABS: 25.0000 mg | ORAL_TABLET | Freq: Every day | ORAL | 6 refills | Status: DC

## 2019-10-01 NOTE — Patient Instructions (Signed)
Medication Instructions:   Your physician has recommended you make the following change in your medication:   1.  STOP taking your Amlodipine (Norvasc). 2.  STOP taking your Spironolactone (Aldactone). 3.  INCREASE your Chlorthalidone (Hygroton):  Take 1 tablet (25 mg total) by mouth daily.   *If you need a refill on your cardiac medications before your next appointment, please call your pharmacy*   Lab Work: None Ordered If you have labs (blood work) drawn today and your tests are completely normal, you will receive your results only by: Marland Kitchen MyChart Message (if you have MyChart) OR . A paper copy in the mail If you have any lab test that is abnormal or we need to change your treatment, we will call you to review the results.   Testing/Procedures: None Ordered   Follow-Up: At [facility], you and your health needs are our priority.  As part of our continuing mission to provide you with exceptional heart care, we have created designated Provider Care Teams.  These Care Teams include your primary Cardiologist (physician) and Advanced Practice Providers (APPs -  Physician Assistants and Nurse Practitioners) who all work together to provide you with the care you need, when you need it.  We recommend signing up for the patient portal called "MyChart".  Sign up information is provided on this After Visit Summary.  MyChart is used to connect with patients for Virtual Visits (Telemedicine).  Patients are able to view lab/test results, encounter notes, upcoming appointments, etc.  Non-urgent messages can be sent to your provider as well.   To learn more about what you can do with MyChart, go to [email or web address].    Your next appointment:   2 week(s)  The format for your next appointment:   In Person  Provider:   Kate Sable, MD   Other Instructions N/A
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
## 2019-10-11 ENCOUNTER — Other Ambulatory Visit: Payer: Self-pay

## 2019-10-11 ENCOUNTER — Other Ambulatory Visit (INDEPENDENT_AMBULATORY_CARE_PROVIDER_SITE_OTHER): Payer: No Typology Code available for payment source

## 2019-10-11 DIAGNOSIS — Z1329 Encounter for screening for other suspected endocrine disorder: Secondary | ICD-10-CM

## 2019-10-11 DIAGNOSIS — E1159 Type 2 diabetes mellitus with other circulatory complications: Secondary | ICD-10-CM | POA: Diagnosis not present

## 2019-10-11 DIAGNOSIS — I10 Essential (primary) hypertension: Secondary | ICD-10-CM

## 2019-10-11 DIAGNOSIS — I152 Hypertension secondary to endocrine disorders: Secondary | ICD-10-CM

## 2019-10-11 LAB — CBC WITH DIFFERENTIAL/PLATELET
Basophils Absolute: 0.1 10*3/uL (ref 0.0–0.1)
Basophils Relative: 1 % (ref 0.0–3.0)
Eosinophils Absolute: 0.1 10*3/uL (ref 0.0–0.7)
Eosinophils Relative: 1.6 % (ref 0.0–5.0)
HCT: 41.1 % (ref 39.0–52.0)
Hemoglobin: 13.3 g/dL (ref 13.0–17.0)
Lymphocytes Relative: 34.4 % (ref 12.0–46.0)
Lymphs Abs: 2.2 10*3/uL (ref 0.7–4.0)
MCHC: 32.3 g/dL (ref 30.0–36.0)
MCV: 75.3 fl — ABNORMAL LOW (ref 78.0–100.0)
Monocytes Absolute: 0.6 10*3/uL (ref 0.1–1.0)
Monocytes Relative: 9 % (ref 3.0–12.0)
Neutro Abs: 3.4 10*3/uL (ref 1.4–7.7)
Neutrophils Relative %: 54 % (ref 43.0–77.0)
Platelets: 227 10*3/uL (ref 150.0–400.0)
RBC: 5.46 Mil/uL (ref 4.22–5.81)
RDW: 15 % (ref 11.5–15.5)
WBC: 6.3 10*3/uL (ref 4.0–10.5)

## 2019-10-11 LAB — COMPREHENSIVE METABOLIC PANEL
ALT: 26 U/L (ref 0–53)
AST: 19 U/L (ref 0–37)
Albumin: 4.2 g/dL (ref 3.5–5.2)
Alkaline Phosphatase: 63 U/L (ref 39–117)
BUN: 27 mg/dL — ABNORMAL HIGH (ref 6–23)
CO2: 27 mEq/L (ref 19–32)
Calcium: 9.4 mg/dL (ref 8.4–10.5)
Chloride: 101 mEq/L (ref 96–112)
Creatinine, Ser: 1.4 mg/dL (ref 0.40–1.50)
GFR: 63.23 mL/min (ref >60.00)
Glucose, Bld: 110 mg/dL — ABNORMAL HIGH (ref 70–99)
Potassium: 3.8 mEq/L (ref 3.5–5.1)
Sodium: 138 mEq/L (ref 135–145)
Total Bilirubin: 0.4 mg/dL (ref 0.2–1.2)
Total Protein: 7.2 g/dL (ref 6.0–8.3)

## 2019-10-11 LAB — LIPID PANEL
Cholesterol: 155 mg/dL (ref 0–200)
HDL: 36.1 mg/dL — ABNORMAL LOW (ref >39.00)
NonHDL: 118.8
Total CHOL/HDL Ratio: 4
Triglycerides: 214 mg/dL — ABNORMAL HIGH (ref 0.0–149.0)
VLDL: 42.8 mg/dL — ABNORMAL HIGH (ref 0.0–40.0)

## 2019-10-11 LAB — MICROALBUMIN / CREATININE URINE RATIO
Creatinine,U: 111.5 mg/dL
Microalb Creat Ratio: 2.1 mg/g (ref 0.0–30.0)
Microalb, Ur: 2.3 mg/dL — ABNORMAL HIGH (ref 0.0–1.9)

## 2019-10-11 LAB — TSH: TSH: 3.24 u[IU]/mL (ref 0.35–4.50)

## 2019-10-11 LAB — LDL CHOLESTEROL, DIRECT: Direct LDL: 58 mg/dL

## 2019-10-11 LAB — HEMOGLOBIN A1C: Hgb A1c MFr Bld: 6.4 % (ref 4.6–6.5)

## 2019-10-18 ENCOUNTER — Telehealth: Payer: Self-pay | Admitting: Internal Medicine

## 2019-10-18 NOTE — Telephone Encounter (Signed)
Faxed blood pressure report to Dr.Agoor-Etang on 10-18-19

## 2019-10-25 ENCOUNTER — Other Ambulatory Visit: Payer: Self-pay

## 2019-10-25 ENCOUNTER — Ambulatory Visit: Payer: No Typology Code available for payment source | Admitting: Cardiology

## 2019-10-25 ENCOUNTER — Encounter: Payer: Self-pay | Admitting: Cardiology

## 2019-10-25 VITALS — BP 140/94 | HR 72 | Ht 75.0 in | Wt 299.4 lb

## 2019-10-25 DIAGNOSIS — I493 Ventricular premature depolarization: Secondary | ICD-10-CM | POA: Diagnosis not present

## 2019-10-25 DIAGNOSIS — E7800 Pure hypercholesterolemia, unspecified: Secondary | ICD-10-CM | POA: Diagnosis not present

## 2019-10-25 DIAGNOSIS — I10 Essential (primary) hypertension: Secondary | ICD-10-CM | POA: Diagnosis not present

## 2019-10-25 DIAGNOSIS — K219 Gastro-esophageal reflux disease without esophagitis: Secondary | ICD-10-CM | POA: Diagnosis not present

## 2019-10-25 MED ORDER — METOPROLOL SUCCINATE ER 100 MG PO TB24
100.0000 mg | ORAL_TABLET | Freq: Every day | ORAL | 5 refills | Status: DC
Start: 2019-10-25 — End: 2020-01-07

## 2019-10-25 NOTE — Patient Instructions (Signed)
Medication Instructions:   Your physician has recommended you make the following change in your medication:   INCREASE your Toprol XL (Metoprolol Succinate):  Take 1 tablet (100 mg total) by mouth daily.  *If you need a refill on your cardiac medications before your next appointment, please call your pharmacy*   Lab Work: None Ordered If you have labs (blood work) drawn today and your tests are completely normal, you will receive your results only by: Marland Kitchen MyChart Message (if you have MyChart) OR . A paper copy in the mail If you have any lab test that is abnormal or we need to change your treatment, we will call you to review the results.   Testing/Procedures: None Ordered   Follow-Up: At [facility], you and your health needs are our priority.  As part of our continuing mission to provide you with exceptional heart care, we have created designated Provider Care Teams.  These Care Teams include your primary Cardiologist (physician) and Advanced Practice Providers (APPs -  Physician Assistants and Nurse Practitioners) who all work together to provide you with the care you need, when you need it.  We recommend signing up for the patient portal called "MyChart".  Sign up information is provided on this After Visit Summary.  MyChart is used to connect with patients for Virtual Visits (Telemedicine).  Patients are able to view lab/test results, encounter notes, upcoming appointments, etc.  Non-urgent messages can be sent to your provider as well.   To learn more about what you can do with MyChart, go to [email or web address].    Your next appointment:   3 month(s)  The format for your next appointment:   In Person  Provider:   Kate Sable, MD   Other Instructions N/A
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
## 2019-12-13 ENCOUNTER — Telehealth: Payer: Self-pay | Admitting: Internal Medicine

## 2019-12-13 ENCOUNTER — Other Ambulatory Visit: Payer: Self-pay | Admitting: Internal Medicine

## 2019-12-13 DIAGNOSIS — G8929 Other chronic pain: Secondary | ICD-10-CM

## 2019-12-13 MED ORDER — TRAMADOL HCL 50 MG PO TABS: 50.0000 mg | ORAL_TABLET | Freq: Two times a day (BID) | ORAL | 0 refills | Status: AC | PRN

## 2019-12-13 NOTE — Telephone Encounter (Signed)
Sent and pain chronic signed today

## 2019-12-13 NOTE — Telephone Encounter (Signed)
Patient is having increased back pain. Has history of degenerative changes in the back.   Requesting Tramadol as needed Please advise

## 2020-01-07 ENCOUNTER — Other Ambulatory Visit: Payer: Self-pay | Admitting: Internal Medicine

## 2020-01-07 ENCOUNTER — Other Ambulatory Visit: Payer: Self-pay

## 2020-01-07 DIAGNOSIS — E119 Type 2 diabetes mellitus without complications: Secondary | ICD-10-CM

## 2020-01-07 MED ORDER — SITAGLIPTIN PHOSPHATE 100 MG PO TABS: 100.0000 mg | ORAL_TABLET | Freq: Every day | ORAL | 3 refills | Status: DC

## 2020-01-07 MED ORDER — METOPROLOL SUCCINATE ER 100 MG PO TB24: 100.0000 mg | ORAL_TABLET | Freq: Every day | ORAL | 5 refills | Status: DC

## 2020-01-07 MED ORDER — LOVASTATIN 40 MG PO TABS: 40.0000 mg | ORAL_TABLET | Freq: Every day | ORAL | 1 refills | Status: DC

## 2020-01-07 MED ORDER — CHLORTHALIDONE 25 MG PO TABS: 25.0000 mg | ORAL_TABLET | Freq: Every day | ORAL | 6 refills | Status: DC

## 2020-01-28 ENCOUNTER — Other Ambulatory Visit: Payer: Self-pay

## 2020-01-28 ENCOUNTER — Ambulatory Visit (INDEPENDENT_AMBULATORY_CARE_PROVIDER_SITE_OTHER): Payer: No Typology Code available for payment source | Admitting: Cardiology

## 2020-01-28 ENCOUNTER — Encounter: Payer: Self-pay | Admitting: Cardiology

## 2020-01-28 VITALS — BP 140/100 | HR 66 | Ht 72.0 in | Wt 298.5 lb

## 2020-01-28 DIAGNOSIS — I10 Essential (primary) hypertension: Secondary | ICD-10-CM | POA: Diagnosis not present

## 2020-01-28 DIAGNOSIS — E7800 Pure hypercholesterolemia, unspecified: Secondary | ICD-10-CM

## 2020-01-28 DIAGNOSIS — I493 Ventricular premature depolarization: Secondary | ICD-10-CM

## 2020-01-28 NOTE — Patient Instructions (Signed)
Medication Instructions:  Your physician recommends that you continue on your current medications as directed. Please refer to the Current Medication list given to you today.  *If you need a refill on your cardiac medications before your next appointment, please call your pharmacy*   Lab Work: None Ordered If you have labs (blood work) drawn today and your tests are completely normal, you will receive your results only by: Marland Kitchen MyChart Message (if you have MyChart) OR . A paper copy in the mail If you have any lab test that is abnormal or we need to change your treatment, we will call you to review the results.   Testing/Procedures: None Ordered   Follow-Up: At [facility], you and your health needs are our priority.  As part of our continuing mission to provide you with exceptional heart care, we have created designated Provider Care Teams.  These Care Teams include your primary Cardiologist (physician) and Advanced Practice Providers (APPs -  Physician Assistants and Nurse Practitioners) who all work together to provide you with the care you need, when you need it.  We recommend signing up for the patient portal called "MyChart".  Sign up information is provided on this After Visit Summary.  MyChart is used to connect with patients for Virtual Visits (Telemedicine).  Patients are able to view lab/test results, encounter notes, upcoming appointments, etc.  Non-urgent messages can be sent to your provider as well.   To learn more about what you can do with MyChart, go to [email or web address].    Your next appointment:   6 month(s)  The format for your next appointment:   In Person  Provider:   Kate Sable, MD   Other Instructions
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
## 2020-02-07 ENCOUNTER — Other Ambulatory Visit: Payer: Self-pay | Admitting: Internal Medicine

## 2020-02-07 ENCOUNTER — Other Ambulatory Visit: Payer: Self-pay

## 2020-02-07 DIAGNOSIS — I10 Essential (primary) hypertension: Secondary | ICD-10-CM

## 2020-02-07 MED ORDER — CANDESARTAN CILEXETIL 32 MG PO TABS: 32.0000 mg | ORAL_TABLET | Freq: Every day | ORAL | 3 refills | Status: DC

## 2020-03-10 ENCOUNTER — Telehealth: Payer: Self-pay | Admitting: Pulmonary Disease

## 2020-03-10 NOTE — Telephone Encounter (Signed)
ATC patient unable to reach LM to call back office (x1)  Also clarify if she received the fax please

## 2020-03-10 NOTE — Telephone Encounter (Signed)
Wife called back - she did receive fax - will wait for return call after someone speaks with Dr. Halford Chessman

## 2020-03-10 NOTE — Telephone Encounter (Signed)
Printed out [employer] for patient.  Faxed to number provided. Explained this was just a print out no Doctor has looked at it or said everything is working well it was just proof he has it and is wearing it.   Patient also wondering if there are any tricks or tips Dr. Halford Chessman might have for patient to keep his machine cooler, Patient states machine is running very hot.   Dr. Halford Chessman please advise.

## 2020-03-10 NOTE — Telephone Encounter (Signed)
Called and spoke with pt's wife Lorriane Shire letting her know the info stated by VS about pt's cpap machine and she verbalized understanding. Nothing further needed.

## 2020-03-10 NOTE — Telephone Encounter (Signed)
If the air is running hot, then he can try decreasing the humidifier setting on his CPAP machine.  If the actual machine is getting overheated, then he needs to have his DME check to make sure the machine is functioning properly.

## 2020-03-19 ENCOUNTER — Encounter: Payer: Self-pay | Admitting: Internal Medicine

## 2020-03-19 ENCOUNTER — Ambulatory Visit (INDEPENDENT_AMBULATORY_CARE_PROVIDER_SITE_OTHER): Payer: No Typology Code available for payment source | Admitting: Internal Medicine

## 2020-03-19 ENCOUNTER — Other Ambulatory Visit: Payer: Self-pay

## 2020-03-19 VITALS — BP 140/88 | HR 76 | Temp 98.2°F | Ht 72.0 in | Wt 298.8 lb

## 2020-03-19 DIAGNOSIS — Z0000 Encounter for general adult medical examination without abnormal findings: Secondary | ICD-10-CM | POA: Diagnosis not present

## 2020-03-19 DIAGNOSIS — E1159 Type 2 diabetes mellitus with other circulatory complications: Secondary | ICD-10-CM

## 2020-03-19 DIAGNOSIS — E559 Vitamin D deficiency, unspecified: Secondary | ICD-10-CM

## 2020-03-19 DIAGNOSIS — R7989 Other specified abnormal findings of blood chemistry: Secondary | ICD-10-CM

## 2020-03-19 DIAGNOSIS — E119 Type 2 diabetes mellitus without complications: Secondary | ICD-10-CM

## 2020-03-19 DIAGNOSIS — R809 Proteinuria, unspecified: Secondary | ICD-10-CM | POA: Insufficient documentation

## 2020-03-19 DIAGNOSIS — Z23 Encounter for immunization: Secondary | ICD-10-CM

## 2020-03-19 DIAGNOSIS — E1121 Type 2 diabetes mellitus with diabetic nephropathy: Secondary | ICD-10-CM | POA: Insufficient documentation

## 2020-03-19 DIAGNOSIS — Z6841 Body Mass Index (BMI) 40.0 and over, adult: Secondary | ICD-10-CM | POA: Insufficient documentation

## 2020-03-19 DIAGNOSIS — B353 Tinea pedis: Secondary | ICD-10-CM

## 2020-03-19 DIAGNOSIS — L84 Corns and callosities: Secondary | ICD-10-CM | POA: Insufficient documentation

## 2020-03-19 DIAGNOSIS — I152 Hypertension secondary to endocrine disorders: Secondary | ICD-10-CM

## 2020-03-19 LAB — POCT GLYCOSYLATED HEMOGLOBIN (HGB A1C): Hemoglobin A1C: 6.1 % — AB (ref 4.0–5.6)

## 2020-03-19 NOTE — Patient Instructions (Addendum)
TDAP vaccine due end of 04/2020  Gold bond talc free powder  lamisil   Let me know if you want to see the podiatrist foot doctor    Athlete's Foot  Athlete's foot (tinea pedis) is a fungal infection of the skin on your feet. It often occurs on the skin that is between or underneath the toes. It can also occur on the soles of your feet. The infection can spread from person to person (is contagious). It can also spread when a person's bare feet come in contact with the fungus on shower floors or on items such as shoes. What are the causes? This condition is caused by a fungus that grows in warm, moist places. You can get athlete's foot by sharing shoes, shower stalls, towels, and wet floors with someone who is infected. Not washing your feet or changing your socks often enough can also lead to athlete's foot. What increases the risk? This condition is more likely to develop in:  Men.  People who have a weak body defense system (immune system).  People who have diabetes.  People who use public showers, such as at a gym.  People who wear heavy-duty shoes, such as Environmental manager.  Seasons with warm, humid weather. What are the signs or symptoms? Symptoms of this condition include:  Itchy areas between your toes or on the soles of your feet.  White, flaky, or scaly areas between your toes or on the soles of your feet.  Very itchy small blisters between your toes or on the soles of your feet.  Small cuts in your skin. These cuts can become infected.  Thick or discolored toenails. How is this diagnosed? This condition may be diagnosed with a physical exam and a review of your medical history. Your health care provider may also take a skin or toenail sample to examine under a microscope. How is this treated? This condition is treated with antifungal medicines. These may be applied as powders, ointments, or creams. In severe cases, an oral antifungal medicine may be  given. Follow these instructions at home: Medicines  Apply or take over-the-counter and prescription medicines only as told by your health care provider.  Apply your antifungal medicine as told by your health care provider. Do not stop using the antifungal even if your condition improves. Foot care  Do not scratch your feet.  Keep your feet dry: ? Wear cotton or wool socks. Change your socks every day or if they become wet. ? Wear shoes that allow air to flow, such as sandals or canvas tennis shoes.  Wash and dry your feet, including the area between your toes. Also, wash and dry your feet: ? Every day or as told by your health care provider. ? After exercising. General instructions  Do not let others use towels, shoes, nail clippers, or other personal items that touch your feet.  Protect your feet by wearing sandals in wet areas, such as locker rooms and shared showers.  Keep all follow-up visits as told by your health care provider. This is important.  If you have diabetes, keep your blood sugar under control. Contact a health care provider if:  You have a fever.  You have swelling, soreness, warmth, or redness in your foot.  Your feet are not getting better with treatment.  Your symptoms get worse.  You have new symptoms. Summary  Athlete's foot (tinea pedis) is a fungal infection of the skin on your feet. It often occurs on  skin that is between or underneath the toes.  This condition is caused by a fungus that grows in warm, moist places.  Symptoms include white, flaky, or scaly areas between your toes or on the soles of your feet.  This condition is treated with antifungal medicines.  Keep your feet clean. Always dry them thoroughly. This information is not intended to replace advice given to you by your health care provider. Make sure you discuss any questions you have with your health care provider. Document Revised: 03/09/2017 Document Reviewed:  01/02/2017 Elsevier Patient Education  [place] DASH stands for "Dietary Approaches to Stop Hypertension." The DASH eating plan is a healthy eating plan that has been shown to reduce high blood pressure (hypertension). It may also reduce your risk for type 2 diabetes, heart disease, and stroke. The DASH eating plan may also help with weight loss. What are tips for following this plan?  General guidelines  Avoid eating more than 2,300 mg (milligrams) of salt (sodium) a day. If you have hypertension, you may need to reduce your sodium intake to 1,500 mg a day.  Limit alcohol intake to no more than 1 drink a day for nonpregnant women and 2 drinks a day for men. One drink equals 12 oz of beer, 5 oz of wine, or 1 oz of hard liquor.  Work with your health care provider to maintain a healthy body weight or to lose weight. Ask what an ideal weight is for you.  Get at least 30 minutes of exercise that causes your heart to beat faster (aerobic exercise) most days of the week. Activities may include walking, swimming, or biking.  Work with your health care provider or diet and nutrition specialist (dietitian) to adjust your eating plan to your individual calorie needs. Reading food labels   Check food labels for the amount of sodium per serving. Choose foods with less than 5 percent of the Daily Value of sodium. Generally, foods with less than 300 mg of sodium per serving fit into this eating plan.  To find whole grains, look for the word "whole" as the first word in the ingredient list. Shopping  Buy products labeled as "low-sodium" or "no salt added."  Buy fresh foods. Avoid canned foods and premade or frozen meals. Cooking  Avoid adding salt when cooking. Use salt-free seasonings or herbs instead of table salt or sea salt. Check with your health care provider or pharmacist before using salt substitutes.  Do not fry foods. Cook foods using healthy methods such as  baking, boiling, grilling, and broiling instead.  Cook with heart-healthy oils, such as olive, canola, soybean, or sunflower oil. Meal planning  Eat a balanced diet that includes: ? 5 or more servings of fruits and vegetables each day. At each meal, try to fill half of your plate with fruits and vegetables. ? Up to 6-8 servings of whole grains each day. ? Less than 6 oz of lean meat, poultry, or fish each day. A 3-oz serving of meat is about the same size as a deck of cards. One egg equals 1 oz. ? 2 servings of low-fat dairy each day. ? A serving of nuts, seeds, or beans 5 times each week. ? Heart-healthy fats. Healthy fats called Omega-3 fatty acids are found in foods such as flaxseeds and coldwater fish, like sardines, salmon, and mackerel.  Limit how much you eat of the following: ? Canned or prepackaged foods. ? Food that is high in  trans fat, such as fried foods. ? Food that is high in saturated fat, such as fatty meat. ? Sweets, desserts, sugary drinks, and other foods with added sugar. ? Full-fat dairy products.  Do not salt foods before eating.  Try to eat at least 2 vegetarian meals each week.  Eat more home-cooked food and less restaurant, buffet, and fast food.  When eating at a restaurant, ask that your food be prepared with less salt or no salt, if possible. What foods are recommended? The items listed may not be a complete list. Talk with your dietitian about what dietary choices are best for you. Grains Whole-grain or whole-wheat bread. Whole-grain or whole-wheat pasta. Brown rice. [place]. Bulgur. Whole-grain and low-sodium cereals. Pita bread. Low-fat, low-sodium crackers. Whole-wheat flour tortillas. Vegetables Fresh or frozen vegetables (raw, steamed, roasted, or grilled). Low-sodium or reduced-sodium tomato and vegetable juice. Low-sodium or reduced-sodium tomato sauce and tomato paste. Low-sodium or reduced-sodium canned vegetables. Fruits All fresh,  dried, or frozen fruit. Canned fruit in natural juice (without added sugar). Meat and other protein foods Skinless chicken or [place]. Ground chicken or [place]. Pork with fat trimmed off. Fish and seafood. Egg whites. Dried beans, peas, or lentils. Unsalted nuts, nut butters, and seeds. Unsalted canned beans. Lean cuts of beef with fat trimmed off. Low-sodium, lean deli meat. Dairy Low-fat (1%) or fat-free (skim) milk. Fat-free, low-fat, or reduced-fat cheeses. Nonfat, low-sodium ricotta or cottage cheese. Low-fat or nonfat yogurt. Low-fat, low-sodium cheese. Fats and oils Soft margarine without trans fats. Vegetable oil. Low-fat, reduced-fat, or light mayonnaise and salad dressings (reduced-sodium). Canola, safflower, olive, soybean, and sunflower oils. Avocado. Seasoning and other foods Herbs. Spices. Seasoning mixes without salt. Unsalted popcorn and pretzels. Fat-free sweets. What foods are not recommended? The items listed may not be a complete list. Talk with your dietitian about what dietary choices are best for you. Grains Baked goods made with fat, such as croissants, muffins, or some breads. Dry pasta or rice meal packs. Vegetables Creamed or fried vegetables. Vegetables in a cheese sauce. Regular canned vegetables (not low-sodium or reduced-sodium). Regular canned tomato sauce and paste (not low-sodium or reduced-sodium). Regular tomato and vegetable juice (not low-sodium or reduced-sodium). Angie Fava. Olives. Fruits Canned fruit in a light or heavy syrup. Fried fruit. Fruit in cream or butter sauce. Meat and other protein foods Fatty cuts of meat. Ribs. Fried meat. Berniece Salines. Sausage. Bologna and other processed lunch meats. Salami. Fatback. Hotdogs. Bratwurst. Salted nuts and seeds. Canned beans with added salt. Canned or smoked fish. Whole eggs or egg yolks. Chicken or [place] with skin. Dairy Whole or 2% milk, cream, and half-and-half. Whole or full-fat cream cheese. Whole-fat or sweetened  yogurt. Full-fat cheese. Nondairy creamers. Whipped toppings. Processed cheese and cheese spreads. Fats and oils Butter. Stick margarine. Lard. Shortening. Ghee. Bacon fat. Tropical oils, such as coconut, palm kernel, or palm oil. Seasoning and other foods Salted popcorn and pretzels. Onion salt, garlic salt, seasoned salt, table salt, and sea salt. Worcestershire sauce. Tartar sauce. Barbecue sauce. Teriyaki sauce. Soy sauce, including reduced-sodium. Steak sauce. Canned and packaged gravies. Fish sauce. Oyster sauce. Cocktail sauce. Horseradish that you find on the shelf. Ketchup. Mustard. Meat flavorings and tenderizers. Bouillon cubes. Hot sauce and Tabasco sauce. Premade or packaged marinades. Premade or packaged taco seasonings. Relishes. Regular salad dressings. Where to find more information:  National Heart, Lung, and [place]: https://wilson-eaton.com/  American Heart Association: www.heart.org Summary  The DASH eating plan is a healthy eating plan that  has been shown to reduce high blood pressure (hypertension). It may also reduce your risk for type 2 diabetes, heart disease, and stroke.  With the DASH eating plan, you should limit salt (sodium) intake to 2,300 mg a day. If you have hypertension, you may need to reduce your sodium intake to 1,500 mg a day.  When on the DASH eating plan, aim to eat more fresh fruits and vegetables, whole grains, lean proteins, low-fat dairy, and heart-healthy fats.  Work with your health care provider or diet and nutrition specialist (dietitian) to adjust your eating plan to your individual calorie needs. This information is not intended to replace advice given to you by your health care provider. Make sure you discuss any questions you have with your health care provider. Document Revised: 02/24/2017 Document Reviewed: 03/07/2016 Elsevier Patient Education  2020 [employer].
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
## 2020-03-20 LAB — URINALYSIS, ROUTINE W REFLEX MICROSCOPIC
Bilirubin Urine: NEGATIVE
Glucose, UA: NEGATIVE
Hgb urine dipstick: NEGATIVE
Hyaline Cast: NONE SEEN /LPF
Ketones, ur: NEGATIVE
Nitrite: NEGATIVE
Protein, ur: NEGATIVE
RBC / HPF: NONE SEEN /HPF (ref 0–2)
Specific Gravity, Urine: 1.016 (ref 1.001–1.03)
Squamous Epithelial / HPF: NONE SEEN /HPF (ref <=5)
WBC, UA: >=60 /HPF — AB (ref 0–5)
pH: 5.5 (ref 5.0–8.0)

## 2020-03-20 LAB — MICROALBUMIN / CREATININE URINE RATIO
Creatinine, Urine: 120 mg/dL (ref 20–320)
Microalb Creat Ratio: 19 mcg/mg creat (ref <30)
Microalb, Ur: 2.3 mg/dL

## 2020-03-23 ENCOUNTER — Telehealth: Payer: Self-pay | Admitting: Internal Medicine

## 2020-03-23 ENCOUNTER — Other Ambulatory Visit: Payer: Self-pay | Admitting: Internal Medicine

## 2020-03-23 ENCOUNTER — Telehealth: Payer: Self-pay

## 2020-03-23 DIAGNOSIS — R7989 Other specified abnormal findings of blood chemistry: Secondary | ICD-10-CM

## 2020-03-23 LAB — CBC WITH DIFFERENTIAL/PLATELET
Absolute Monocytes: 568 cells/uL (ref 200–950)
Basophils Absolute: 28 cells/uL (ref 0–200)
Basophils Relative: 0.4 %
Eosinophils Absolute: 142 cells/uL (ref 15–500)
Eosinophils Relative: 2 %
HCT: 43.9 % (ref 38.5–50.0)
Hemoglobin: 14.6 g/dL (ref 13.2–17.1)
Lymphs Abs: 2599 cells/uL (ref 850–3900)
MCH: 24.6 pg — ABNORMAL LOW (ref 27.0–33.0)
MCHC: 33.3 g/dL (ref 32.0–36.0)
MCV: 74 fL — ABNORMAL LOW (ref 80.0–100.0)
MPV: 9.4 fL (ref 7.5–12.5)
Monocytes Relative: 8 %
Neutro Abs: 3763 cells/uL (ref 1500–7800)
Neutrophils Relative %: 53 %
Platelets: 270 10*3/uL (ref 140–400)
RBC: 5.93 10*6/uL — ABNORMAL HIGH (ref 4.20–5.80)
RDW: 15.2 % — ABNORMAL HIGH (ref 11.0–15.0)
Total Lymphocyte: 36.6 %
WBC: 7.1 10*3/uL (ref 3.8–10.8)

## 2020-03-23 LAB — COMPREHENSIVE METABOLIC PANEL
AG Ratio: 1.3 (calc) (ref 1.0–2.5)
ALT: 27 U/L (ref 9–46)
AST: 22 U/L (ref 10–35)
Albumin: 4.4 g/dL (ref 3.6–5.1)
Alkaline phosphatase (APISO): 69 U/L (ref 35–144)
BUN/Creatinine Ratio: 19 (calc) (ref 6–22)
BUN: 28 mg/dL — ABNORMAL HIGH (ref 7–25)
CO2: 20 mmol/L (ref 20–32)
Calcium: 10.4 mg/dL — ABNORMAL HIGH (ref 8.6–10.3)
Chloride: 107 mmol/L (ref 98–110)
Creat: 1.5 mg/dL — ABNORMAL HIGH (ref 0.70–1.33)
Globulin: 3.3 g/dL (calc) (ref 1.9–3.7)
Glucose, Bld: 124 mg/dL — ABNORMAL HIGH (ref 65–99)
Potassium: 3.9 mmol/L (ref 3.5–5.3)
Sodium: 145 mmol/L (ref 135–146)
Total Bilirubin: 0.3 mg/dL (ref 0.2–1.2)
Total Protein: 7.7 g/dL (ref 6.1–8.1)

## 2020-03-23 LAB — TEST AUTHORIZATION

## 2020-03-23 LAB — VITAMIN D 25 HYDROXY (VIT D DEFICIENCY, FRACTURES): Vit D, 25-Hydroxy: 41 ng/mL (ref 30–100)

## 2020-03-23 LAB — CREATININE WITH EST GFR
Creat: 1.5 mg/dL — ABNORMAL HIGH (ref 0.70–1.33)
GFR, Est African American: 59 mL/min/{1.73_m2} — ABNORMAL LOW (ref >=60)
GFR, Est Non African American: 51 mL/min/{1.73_m2} — ABNORMAL LOW (ref >=60)

## 2020-03-23 LAB — LIPID PANEL
Cholesterol: 156 mg/dL (ref <200)
HDL: 40 mg/dL (ref >=40)
LDL Cholesterol (Calc): 85 mg/dL (calc)
Non-HDL Cholesterol (Calc): 116 mg/dL (calc) (ref <130)
Total CHOL/HDL Ratio: 3.9 (calc) (ref <5.0)
Triglycerides: 225 mg/dL — ABNORMAL HIGH (ref <150)

## 2020-03-23 MED ORDER — CARVEDILOL 12.5 MG PO TABS: 12.5000 mg | ORAL_TABLET | Freq: Two times a day (BID) | ORAL | 5 refills | Status: DC

## 2020-03-23 NOTE — Telephone Encounter (Signed)
See prior note 03/23/20

## 2020-03-23 NOTE — Progress Notes (Signed)
Im sure we will be able to add it. Can you place the order Dr. Judie Grieve :)?

## 2020-03-23 NOTE — Telephone Encounter (Signed)
Called patient and left a detailed VM per DPR on file of the following note:  Agbor-Etang, Arlys John, MD  McLean-Scocuzza, Pasty Spillers, MD; Gibson Ramp, RN Hi,  Will favor stopping Toprol-XL and starting Coreg 12.5 mg twice daily instead 12/27-for additional blood pressure benefit in addition to beta-blockade. We can always titrate to 25 mg twice daily if BP still elevated.    Encouraged patient to call back with any questions or concerns.

## 2020-03-23 NOTE — Telephone Encounter (Signed)
Patient informed and verbalized understanding

## 2020-03-23 NOTE — Telephone Encounter (Signed)
Will you call pt and sch f/u Dr. Arlys John in 6-8 weeks in office   Thank you

## 2020-03-23 NOTE — Telephone Encounter (Signed)
Arianna inform pt  Dr. Azucena Cecil are you going to E Rx those changes or would you like me to?   When should pt f/u with cards?     Debbe Odea, MD  McLean-Scocuzza, Pasty Spillers, MD; Gibson Ramp, RN Hi,  Will favor stopping Toprol-XL and starting Coreg 12.5 mg twice daily instead for additional blood pressure benefit in addition to beta-blockade. We can always titrate to 25 mg twice daily if BP still elevated.   Thank you

## 2020-03-24 ENCOUNTER — Other Ambulatory Visit: Payer: Self-pay | Admitting: Cardiology

## 2020-03-24 ENCOUNTER — Telehealth: Payer: Self-pay

## 2020-03-24 DIAGNOSIS — E782 Mixed hyperlipidemia: Secondary | ICD-10-CM

## 2020-03-24 MED ORDER — FENOFIBRATE 145 MG PO TABS: 145.0000 mg | ORAL_TABLET | Freq: Every day | ORAL | 5 refills | Status: DC

## 2020-03-24 NOTE — Telephone Encounter (Signed)
-----   Message from Debbe Odea, MD sent at 03/23/2020  4:50 PM EST ----- The patient's total and LDL levels is well controlled.  However, triglycerides are elevated.  We will have my RN prescribe fenofibrate to target/control high triglycerides levels.  Continue lovastatin as prescribed.  Plan for repeat lipid panel in about 3 months.  Thank you BA ----- Message ----- From: McLean-Scocuzza, Pasty Spillers, MD Sent: 03/23/2020   9:33 AM EST To: Heloise Purpura, MD, Debbe Odea, MD, #  A1C 6.1 great at goal <7  Blood cts stable Kidney function increased -please increase water intake 55-64 ounces daily  Calcium slightly elevated -please stop any extra calcium if taking in multivitamin   -->Arianna schedule repeat BMET with GFR in 2-4 weeks   Triglycerides increased  -rec healthy diet and exercise f/u cardiology  -mail cholesterol handout  -Dr. Arlys John do we need to change cholesterol medication?   Vitamin D normal   Urine cloudy many WBC and bacteria f/u with urology, any UTI sxs ? Increase water intake

## 2020-03-24 NOTE — Telephone Encounter (Signed)
Called and left a detailed message with the below recommendations. Fenofibrate sent to [facility]. Future Lipid Panel order placed. Also sent a message to Patient through MyChart requesting receipt of VM.  Will route to scheduling to reach out to patient to schedule a 6-8 week follow up as requested by Dr. Azucena Cecil.

## 2020-03-25 ENCOUNTER — Telehealth: Payer: Self-pay | Admitting: Internal Medicine

## 2020-03-25 NOTE — Telephone Encounter (Signed)
Pt declines fenofibrate for now and wants to try lifestyle   Thanks

## 2020-04-02 LAB — HM DIABETES EYE EXAM

## 2020-04-03 ENCOUNTER — Encounter: Payer: Self-pay | Admitting: Internal Medicine

## 2020-04-20 ENCOUNTER — Other Ambulatory Visit: Payer: Self-pay

## 2020-04-20 ENCOUNTER — Other Ambulatory Visit (INDEPENDENT_AMBULATORY_CARE_PROVIDER_SITE_OTHER): Payer: No Typology Code available for payment source

## 2020-04-20 DIAGNOSIS — R7989 Other specified abnormal findings of blood chemistry: Secondary | ICD-10-CM | POA: Diagnosis not present

## 2020-04-20 LAB — CREATININE WITH EST GFR
Creat: 1.84 mg/dL — ABNORMAL HIGH (ref 0.70–1.33)
GFR, Est African American: 46 mL/min/{1.73_m2} — ABNORMAL LOW (ref >=60)
GFR, Est Non African American: 40 mL/min/{1.73_m2} — ABNORMAL LOW (ref >=60)

## 2020-04-21 ENCOUNTER — Other Ambulatory Visit: Payer: Self-pay | Admitting: Cardiology

## 2020-04-21 ENCOUNTER — Telehealth: Payer: Self-pay

## 2020-04-21 DIAGNOSIS — I10 Essential (primary) hypertension: Secondary | ICD-10-CM

## 2020-04-21 MED ORDER — CARVEDILOL 25 MG PO TABS: 25.0000 mg | ORAL_TABLET | Freq: Two times a day (BID) | ORAL | 5 refills | Status: DC

## 2020-04-21 NOTE — Telephone Encounter (Signed)
Called patient and spoke both to him and his wife. I gave them the following instructions from Dr. Garen Lah, Patient and his wife both verbalized understanding and agreed with plan.       Stop chlorthalidone. Stop Toprol-XL. (Toprol XL was already stopped see below) Start Coreg 25 mg twice daily. Continue candesartan as patient is a diabetic.  Repeat BMP in 7 days. Keep BP log.  Keep follow-up appointment with myself next month.    Patient had already stopped Toprol XL as noted below. Will route to Dr. Garen Lah. Kate Sable, MD  McLean-Scocuzza, Nino Glow, MD; Kavin Leech, RN Hi,  Will favor stopping Toprol-XL and starting Coreg 12.5 mg twice daily instead 12/27-for additional blood pressure benefit in addition to beta-blockade. We can always titrate to 25 mg twice daily if BP still elevated.

## 2020-05-01 ENCOUNTER — Other Ambulatory Visit: Payer: Self-pay

## 2020-05-01 ENCOUNTER — Encounter: Payer: Self-pay | Admitting: Cardiology

## 2020-05-01 ENCOUNTER — Ambulatory Visit: Payer: No Typology Code available for payment source | Admitting: Cardiology

## 2020-05-01 ENCOUNTER — Other Ambulatory Visit: Payer: Self-pay | Admitting: Internal Medicine

## 2020-05-01 VITALS — BP 120/82 | HR 68 | Ht 75.0 in | Wt 299.0 lb

## 2020-05-01 DIAGNOSIS — I493 Ventricular premature depolarization: Secondary | ICD-10-CM | POA: Diagnosis not present

## 2020-05-01 DIAGNOSIS — E782 Mixed hyperlipidemia: Secondary | ICD-10-CM

## 2020-05-01 DIAGNOSIS — I10 Essential (primary) hypertension: Secondary | ICD-10-CM

## 2020-05-01 MED ORDER — IRBESARTAN 300 MG PO TABS: 300.0000 mg | ORAL_TABLET | Freq: Every day | ORAL | 5 refills | Status: DC
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
## 2020-05-01 NOTE — Patient Instructions (Signed)
Medication Instructions:   Your physician has recommended you make the following change in your medication:   1.  STOP taking hydrochlorothiazide (HYDRODIURIL)  2.  START taking Irbesartan (Avapro) 300 MG: Take 1 tab by mouth daily.   *If you need a refill on your cardiac medications before your next appointment, please call your pharmacy*   Lab Work:  BMP drawn today.  If you have labs (blood work) drawn today and your tests are completely normal, you will receive your results only by: Marland Kitchen MyChart Message (if you have MyChart) OR . A paper copy in the mail If you have any lab test that is abnormal or we need to change your treatment, we will call you to review the results.   Testing/Procedures: None ordered   Follow-Up: At [facility], you and your health needs are our priority.  As part of our continuing mission to provide you with exceptional heart care, we have created designated Provider Care Teams.  These Care Teams include your primary Cardiologist (physician) and Advanced Practice Providers (APPs -  Physician Assistants and Nurse Practitioners) who all work together to provide you with the care you need, when you need it.  We recommend signing up for the patient portal called "MyChart".  Sign up information is provided on this After Visit Summary.  MyChart is used to connect with patients for Virtual Visits (Telemedicine).  Patients are able to view lab/test results, encounter notes, upcoming appointments, etc.  Non-urgent messages can be sent to your provider as well.   To learn more about what you can do with MyChart, go to [email or web address].    Your next appointment:   6 week(s)  The format for your next appointment:   In Person  Provider:   Kate Sable, MD   Other Instructions

## 2020-05-02 LAB — BASIC METABOLIC PANEL
BUN/Creatinine Ratio: 15 (ref 9–20)
BUN: 22 mg/dL (ref 6–24)
CO2: 20 mmol/L (ref 20–29)
Calcium: 9.7 mg/dL (ref 8.7–10.2)
Chloride: 102 mmol/L (ref 96–106)
Creatinine, Ser: 1.44 mg/dL — ABNORMAL HIGH (ref 0.76–1.27)
GFR calc Af Amer: 62 mL/min/{1.73_m2} (ref >59)
GFR calc non Af Amer: 54 mL/min/{1.73_m2} — ABNORMAL LOW (ref >59)
Glucose: 110 mg/dL — ABNORMAL HIGH (ref 65–99)
Potassium: 4 mmol/L (ref 3.5–5.2)
Sodium: 137 mmol/L (ref 134–144)

## 2020-05-07 ENCOUNTER — Other Ambulatory Visit: Payer: Self-pay | Admitting: Urology

## 2020-05-11 ENCOUNTER — Telehealth: Payer: Self-pay | Admitting: Cardiology

## 2020-05-11 MED ORDER — NIFEDIPINE ER OSMOTIC RELEASE 60 MG PO TB24: 60.0000 mg | ORAL_TABLET | Freq: Every day | ORAL | 5 refills | Status: DC

## 2020-05-11 NOTE — Telephone Encounter (Signed)
Patients wife called back and I reviewed the below recommendations and she will be picking up the new prescription. He will take it for a week and then send in his BP readings for [place] to review.  Wife was grateful for the call.

## 2020-05-11 NOTE — Telephone Encounter (Signed)
173/100 last night  170/99  169/99 this morning    Pt c/o BP issue: STAT if pt c/o blurred vision, one-sided weakness or slurred speech  1. What are your last 5 BP readings? Trending up new bp med added last visit not helping   2. Are you having any other symptoms (ex. Dizziness, headache, blurred vision, passed out)? Headache radiates from back to head  and fluttering in chest   3. What is your BP issue? Concerned bp still not coming down

## 2020-05-11 NOTE — Telephone Encounter (Signed)
Called patient and left him a detailed VM per DPR on file with the following recommendations:  Agbor-Etang, Aaron Edelman, MD  You 38 minutes ago (3:27 PM)   Start nifedipine ER 60 mg daily. Thank you    Sent prescription into patients pharmacy on file.

## 2020-06-01 ENCOUNTER — Other Ambulatory Visit: Payer: Self-pay | Admitting: Internal Medicine

## 2020-06-01 DIAGNOSIS — E119 Type 2 diabetes mellitus without complications: Secondary | ICD-10-CM

## 2020-06-12 ENCOUNTER — Other Ambulatory Visit: Payer: Self-pay

## 2020-06-12 ENCOUNTER — Other Ambulatory Visit: Payer: Self-pay | Admitting: *Deleted

## 2020-06-12 ENCOUNTER — Ambulatory Visit (INDEPENDENT_AMBULATORY_CARE_PROVIDER_SITE_OTHER): Payer: No Typology Code available for payment source | Admitting: Cardiology

## 2020-06-12 ENCOUNTER — Other Ambulatory Visit: Payer: Self-pay | Admitting: Cardiology

## 2020-06-12 ENCOUNTER — Encounter: Payer: Self-pay | Admitting: Cardiology

## 2020-06-12 VITALS — BP 130/88 | HR 70 | Ht 75.0 in | Wt 302.0 lb

## 2020-06-12 DIAGNOSIS — I493 Ventricular premature depolarization: Secondary | ICD-10-CM

## 2020-06-12 DIAGNOSIS — I10 Essential (primary) hypertension: Secondary | ICD-10-CM | POA: Diagnosis not present

## 2020-06-12 DIAGNOSIS — E782 Mixed hyperlipidemia: Secondary | ICD-10-CM

## 2020-06-12 MED ORDER — NIFEDIPINE ER 90 MG PO TB24
90.0000 mg | ORAL_TABLET | Freq: Every day | ORAL | 3 refills | Status: DC
Start: 2020-06-12 — End: 2020-06-12

## 2020-06-12 MED ORDER — NIFEDIPINE ER OSMOTIC RELEASE 90 MG PO TB24: 90.0000 mg | ORAL_TABLET | Freq: Every day | ORAL | 2 refills | Status: DC

## 2020-06-12 NOTE — Patient Instructions (Signed)
Medication Instructions:   Your physician has recommended you make the following change in your medication:   INCREASE your Nifedipine to 90 MG daily.  (you can take 1.5 tab of your 60 MG tabs until you run out. A new prescription for 90 MG tablets has been sent to your pharmacy)  *If you need a refill on your cardiac medications before your next appointment, please call your pharmacy*   Lab Work: None ordered If you have labs (blood work) drawn today and your tests are completely normal, you will receive your results only by: Marland Kitchen MyChart Message (if you have MyChart) OR . A paper copy in the mail If you have any lab test that is abnormal or we need to change your treatment, we will call you to review the results.   Testing/Procedures: None ordered   Follow-Up: At [facility], you and your health needs are our priority.  As part of our continuing mission to provide you with exceptional heart care, we have created designated Provider Care Teams.  These Care Teams include your primary Cardiologist (physician) and Advanced Practice Providers (APPs -  Physician Assistants and Nurse Practitioners) who all work together to provide you with the care you need, when you need it.  We recommend signing up for the patient portal called "MyChart".  Sign up information is provided on this After Visit Summary.  MyChart is used to connect with patients for Virtual Visits (Telemedicine).  Patients are able to view lab/test results, encounter notes, upcoming appointments, etc.  Non-urgent messages can be sent to your provider as well.   To learn more about what you can do with MyChart, go to [email or web address].    Your next appointment:   3 month(s)  The format for your next appointment:   In Person  Provider:   Kate Sable, MD   Other Instructions

## 2020-06-12 NOTE — Telephone Encounter (Signed)
Nicholas Carr, CMA to [facility] Triage    06/12/20 2:34 PM Please advise fax received from [facility] outpatient pharmacy.  Message below received from Pharmacy:  *Pt has been on generic Procardia XL this script was sent in for Adalat CC. Can we continue therapy with Procardia Genric?     Rx changed to Procardia generic and sent to pharmacy.
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
## 2020-06-26 ENCOUNTER — Ambulatory Visit (INDEPENDENT_AMBULATORY_CARE_PROVIDER_SITE_OTHER): Payer: No Typology Code available for payment source | Admitting: Internal Medicine

## 2020-06-26 ENCOUNTER — Encounter: Payer: Self-pay | Admitting: Internal Medicine

## 2020-06-26 ENCOUNTER — Other Ambulatory Visit: Payer: Self-pay

## 2020-06-26 ENCOUNTER — Ambulatory Visit: Payer: No Typology Code available for payment source | Admitting: Internal Medicine

## 2020-06-26 VITALS — BP 120/76 | HR 78 | Temp 97.5°F | Ht 75.0 in | Wt 303.4 lb

## 2020-06-26 DIAGNOSIS — Z23 Encounter for immunization: Secondary | ICD-10-CM | POA: Diagnosis not present

## 2020-06-26 DIAGNOSIS — E611 Iron deficiency: Secondary | ICD-10-CM

## 2020-06-26 DIAGNOSIS — E1159 Type 2 diabetes mellitus with other circulatory complications: Secondary | ICD-10-CM | POA: Diagnosis not present

## 2020-06-26 DIAGNOSIS — R718 Other abnormality of red blood cells: Secondary | ICD-10-CM

## 2020-06-26 DIAGNOSIS — I152 Hypertension secondary to endocrine disorders: Secondary | ICD-10-CM

## 2020-06-26 LAB — CBC WITH DIFFERENTIAL/PLATELET
Basophils Absolute: 0 10*3/uL (ref 0.0–0.1)
Basophils Relative: 0.6 % (ref 0.0–3.0)
Eosinophils Absolute: 0.1 10*3/uL (ref 0.0–0.7)
Eosinophils Relative: 2 % (ref 0.0–5.0)
HCT: 41.2 % (ref 39.0–52.0)
Hemoglobin: 13.2 g/dL (ref 13.0–17.0)
Lymphocytes Relative: 27.9 % (ref 12.0–46.0)
Lymphs Abs: 1.4 10*3/uL (ref 0.7–4.0)
MCHC: 32.1 g/dL (ref 30.0–36.0)
MCV: 73.7 fl — ABNORMAL LOW (ref 78.0–100.0)
Monocytes Absolute: 0.4 10*3/uL (ref 0.1–1.0)
Monocytes Relative: 8.3 % (ref 3.0–12.0)
Neutro Abs: 3 10*3/uL (ref 1.4–7.7)
Neutrophils Relative %: 61.2 % (ref 43.0–77.0)
Platelets: 250 10*3/uL (ref 150.0–400.0)
RBC: 5.59 Mil/uL (ref 4.22–5.81)
RDW: 15.1 % (ref 11.5–15.5)
WBC: 4.9 10*3/uL (ref 4.0–10.5)

## 2020-06-26 LAB — LIPID PANEL
Cholesterol: 130 mg/dL (ref 0–200)
HDL: 38.8 mg/dL — ABNORMAL LOW (ref >39.00)
LDL Cholesterol: 70 mg/dL (ref 0–99)
NonHDL: 91.07
Total CHOL/HDL Ratio: 3
Triglycerides: 105 mg/dL (ref 0.0–149.0)
VLDL: 21 mg/dL (ref 0.0–40.0)

## 2020-06-26 LAB — COMPREHENSIVE METABOLIC PANEL
ALT: 23 U/L (ref 0–53)
AST: 16 U/L (ref 0–37)
Albumin: 4.1 g/dL (ref 3.5–5.2)
Alkaline Phosphatase: 56 U/L (ref 39–117)
BUN: 21 mg/dL (ref 6–23)
CO2: 26 mEq/L (ref 19–32)
Calcium: 9.4 mg/dL (ref 8.4–10.5)
Chloride: 105 mEq/L (ref 96–112)
Creatinine, Ser: 1.5 mg/dL (ref 0.40–1.50)
GFR: 51.34 mL/min — ABNORMAL LOW (ref >60.00)
Glucose, Bld: 131 mg/dL — ABNORMAL HIGH (ref 70–99)
Potassium: 4.2 mEq/L (ref 3.5–5.1)
Sodium: 139 mEq/L (ref 135–145)
Total Bilirubin: 0.4 mg/dL (ref 0.2–1.2)
Total Protein: 7.2 g/dL (ref 6.0–8.3)

## 2020-06-26 LAB — HEMOGLOBIN A1C: Hgb A1c MFr Bld: 6 % (ref 4.6–6.5)
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
## 2020-06-26 NOTE — Patient Instructions (Signed)
Consider pfizer 3rd dose and now they are thinking about 4th dose   Tdap (Tetanus, Diphtheria, Pertussis) Vaccine: What You Need to Know 1. Why get vaccinated? Tdap vaccine can prevent tetanus, diphtheria, and pertussis. Diphtheria and pertussis spread from person to person. Tetanus enters the body through cuts or wounds.  TETANUS (T) causes painful stiffening of the muscles. Tetanus can lead to serious health problems, including being unable to open the mouth, having trouble swallowing and breathing, or death.  DIPHTHERIA (D) can lead to difficulty breathing, heart failure, paralysis, or death.  PERTUSSIS (aP), also known as "whooping cough," can cause uncontrollable, violent coughing that makes it hard to breathe, eat, or drink. Pertussis can be extremely serious especially in babies and young children, causing pneumonia, convulsions, brain damage, or death. In teens and adults, it can cause weight loss, loss of bladder control, passing out, and rib fractures from severe coughing. 2. Tdap vaccine Tdap is only for children 7 years and older, adolescents, and adults.  Adolescents should receive a single dose of Tdap, preferably at age 55 or 73 years. Pregnant people should get a dose of Tdap during every pregnancy, preferably during the early part of the third trimester, to help protect the newborn from pertussis. Infants are most at risk for severe, life-threatening complications from pertussis. Adults who have never received Tdap should get a dose of Tdap. Also, adults should receive a booster dose of either Tdap or Td (a different vaccine that protects against tetanus and diphtheria but not pertussis) every 10 years, or after 5 years in the case of a severe or dirty wound or burn. Tdap may be given at the same time as other vaccines. 3. Talk with your health care provider Tell your vaccine provider if the person getting the vaccine:  Has had an allergic reaction after a previous dose of any  vaccine that protects against tetanus, diphtheria, or pertussis, or has any severe, life-threatening allergies  Has had a coma, decreased level of consciousness, or prolonged seizures within 7 days after a previous dose of any pertussis vaccine (DTP, DTaP, or Tdap)  Has seizures or another nervous system problem  Has ever had Guillain-Barr Syndrome (also called "GBS")  Has had severe pain or swelling after a previous dose of any vaccine that protects against tetanus or diphtheria In some cases, your health care provider may decide to postpone Tdap vaccination until a future visit. People with minor illnesses, such as a cold, may be vaccinated. People who are moderately or severely ill should usually wait until they recover before getting Tdap vaccine.  Your health care provider can give you more information. 4. Risks of a vaccine reaction  Pain, redness, or swelling where the shot was given, mild fever, headache, feeling tired, and nausea, vomiting, diarrhea, or stomachache sometimes happen after Tdap vaccination. People sometimes faint after medical procedures, including vaccination. Tell your provider if you feel dizzy or have vision changes or ringing in the ears.  As with any medicine, there is a very remote chance of a vaccine causing a severe allergic reaction, other serious injury, or death. 5. What if there is a serious problem? An allergic reaction could occur after the vaccinated person leaves the clinic. If you see signs of a severe allergic reaction (hives, swelling of the face and throat, difficulty breathing, a fast heartbeat, dizziness, or weakness), call 9-1-1 and get the person to the nearest hospital. For other signs that concern you, call your health care provider.  Adverse  reactions should be reported to the Vaccine Adverse Event Reporting System (VAERS). Your health care provider will usually file this report, or you can do it yourself. Visit the VAERS website at  www.vaers.SamedayNews.es or call 9146387081. VAERS is only for reporting reactions, and VAERS staff members do not give medical advice. 6. The National Vaccine Injury Compensation Program The [employer] Vaccine Injury Compensation Program (VICP) is a federal program that was created to compensate people who may have been injured by certain vaccines. Claims regarding alleged injury or death due to vaccination have a time limit for filing, which may be as short as two years. Visit the VICP website at GoldCloset.com.ee or call (352)606-4736 to learn about the program and about filing a claim. 7. How can I learn more?  Ask your health care provider.  Call your local or state health department.  Visit the website of the Food and Drug Administration (FDA) for vaccine package inserts and additional information at TraderRating.uy.  Contact the Centers for Disease Control and Prevention (CDC): ? Call (267)378-7967 (1-800-CDC-INFO) or ? Visit CDC's website at http://hunter.com/. Vaccine Information Statement Tdap (Tetanus, Diphtheria, Pertussis) Vaccine (11/01/2019) This information is not intended to replace advice given to you by your health care provider. Make sure you discuss any questions you have with your health care provider. Document Revised: 11/27/2019 Document Reviewed: 11/27/2019 Elsevier Patient Education  2021 [employer].

## 2020-06-27 LAB — IRON,TIBC AND FERRITIN PANEL
%SAT: 28 % (ref 20–48)
Ferritin: 110 ng/mL (ref 38–380)
Iron: 99 ug/dL (ref 50–180)
TIBC: 360 ug/dL (ref 250–425)

## 2020-06-29 ENCOUNTER — Other Ambulatory Visit: Payer: Self-pay

## 2020-06-29 ENCOUNTER — Other Ambulatory Visit: Payer: Self-pay | Admitting: Internal Medicine

## 2020-06-29 MED FILL — Sildenafil Citrate Tab 100 MG: ORAL | 30 days supply | Qty: 6 | Fill #0 | Status: AC

## 2020-06-29 MED FILL — Carvedilol Tab 25 MG: ORAL | 30 days supply | Qty: 60 | Fill #0 | Status: AC

## 2020-06-30 ENCOUNTER — Other Ambulatory Visit: Payer: Self-pay

## 2020-06-30 MED ORDER — LOVASTATIN 40 MG PO TABS
ORAL_TABLET | ORAL | 1 refills | Status: DC
  Filled 2020-06-30: qty 90, 90d supply, fill #0
  Filled 2020-09-26 – 2020-10-01 (×2): qty 90, 90d supply, fill #1

## 2020-07-04 MED FILL — Sitagliptin Phosphate Tab 100 MG (Base Equiv): ORAL | 30 days supply | Qty: 30 | Fill #0 | Status: AC

## 2020-07-06 ENCOUNTER — Other Ambulatory Visit: Payer: Self-pay

## 2020-07-08 ENCOUNTER — Other Ambulatory Visit: Payer: Self-pay

## 2020-07-10 MED FILL — Fenofibrate Tab 145 MG: ORAL | 30 days supply | Qty: 30 | Fill #0 | Status: AC

## 2020-07-13 ENCOUNTER — Other Ambulatory Visit: Payer: Self-pay

## 2020-07-17 MED FILL — Sildenafil Citrate Tab 100 MG: ORAL | 30 days supply | Qty: 6 | Fill #1 | Status: CN

## 2020-07-17 MED FILL — Irbesartan Tab 300 MG: ORAL | 30 days supply | Qty: 30 | Fill #0 | Status: AC

## 2020-07-19 ENCOUNTER — Other Ambulatory Visit: Payer: Self-pay

## 2020-07-20 ENCOUNTER — Other Ambulatory Visit: Payer: Self-pay

## 2020-07-22 ENCOUNTER — Telehealth: Payer: Self-pay | Admitting: Cardiology

## 2020-07-22 NOTE — Telephone Encounter (Signed)
Spoke with the patient. Patient sts that he has had an increase in LE swelling since his HCTZ was d/c in Feb 2022. HCTZ was stopped due to a bump in his Cr.   Patient denies sob, orthopnea, pnd. 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 Recommended that he purchase and try compression socks. Adv him to wear them daily and remove at bedtime.  Patient also sts that his BP has been great systolic readings in the 185/631/'S. Patient is at work and could not provide specific readings. Adv the patient that I will fwd the update to Dr. Garen Lah for further recommendation.

## 2020-07-22 NOTE — Telephone Encounter (Signed)
Pt c/o swelling: STAT is pt has developed SOB within 24 hours  1) How much weight have you gained and in what time span? Not sure   2) If swelling, where is the swelling located? BLE Mid Calf to feet with a little pitting   3) Are you currently taking a fluid pill?  No stopped due to kidney function results   4) Are you currently SOB? No   5) Do you have a log of your daily weights (if so, list)? No   6) Have you gained 3 pounds in a day or 5 pounds in a week?  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

## 2020-07-23 NOTE — Telephone Encounter (Signed)
Called paitent and reviewed Dr. Thereasa Solo note below:  Nicholas Sable, MD  You; Lamar Laundry, RN 21 hours ago (4:19 PM)   Prescribed Lasix 20 mg daily as needed edema.   Please advise patient that his edema is dependent. Lasix should be taken only as needed for edema. I agree with your recommendations regarding compression stockings, leg raises when in seated position. Thank you

## 2020-07-27 ENCOUNTER — Other Ambulatory Visit: Payer: Self-pay

## 2020-07-27 MED ORDER — FREESTYLE LITE TEST VI STRP
ORAL_STRIP | 12 refills | Status: DC
  Filled 2020-07-27: qty 100, 30d supply, fill #0
  Filled 2020-12-16: qty 100, 30d supply, fill #1
  Filled 2021-02-06: qty 100, 30d supply, fill #2
  Filled 2021-05-08: qty 100, 30d supply, fill #3

## 2020-07-27 MED ORDER — ACCU-CHEK SOFTCLIX LANCETS MISC
12 refills | Status: DC
  Filled 2020-07-27: qty 100, 25d supply, fill #0

## 2020-07-28 ENCOUNTER — Other Ambulatory Visit: Payer: Self-pay

## 2020-07-29 ENCOUNTER — Other Ambulatory Visit: Payer: Self-pay

## 2020-07-30 ENCOUNTER — Telehealth: Payer: Self-pay | Admitting: Internal Medicine

## 2020-07-30 ENCOUNTER — Other Ambulatory Visit: Payer: Self-pay | Admitting: Internal Medicine

## 2020-07-30 DIAGNOSIS — E119 Type 2 diabetes mellitus without complications: Secondary | ICD-10-CM

## 2020-07-30 MED ORDER — SITAGLIPTIN PHOSPHATE 100 MG PO TABS: 50.0000 mg | ORAL_TABLET | Freq: Every day | ORAL | 3 refills | Status: DC

## 2020-07-30 MED ORDER — METFORMIN HCL ER 500 MG PO TB24: 500.0000 mg | ORAL_TABLET | Freq: Every day | ORAL | 3 refills | Status: DC

## 2020-07-30 NOTE — Telephone Encounter (Signed)
Patient has been having fasting blood sugars on the lower side of normal. Patient has been making dietary changes while also being compliant with current medications.   Patient has been having fasting sugars in the low 80s and 90s. Although it is in normal range Patient has been having feelings of fatigue and brain fog with these numbers.   Patient is currently on Metformin XR 500 mg 2 pills once daily and Januvia 100 mg daily.   Please advise

## 2020-07-30 NOTE — Telephone Encounter (Signed)
Please reduce metformin to 1 pill daily and januvia 50 mg (1/2 pill) daily  Monitor sugar  Too low is 70 or less and can cause symptoms

## 2020-07-31 MED FILL — Sildenafil Citrate Tab 100 MG: ORAL | 30 days supply | Qty: 6 | Fill #1 | Status: AC

## 2020-07-31 MED FILL — Carvedilol Tab 25 MG: ORAL | 30 days supply | Qty: 60 | Fill #1 | Status: AC

## 2020-07-31 NOTE — Telephone Encounter (Signed)
Called and informed Patient's wife. States they will drop down to 1 pill of metformin for the Patient. Would like to hold off for a week or 2 with just changing the Metformin before changing the Januvia as well.  If Patient still having lows in 1-2 weeks they would like a 25 mg Januvia pill sent in as his 50 mg pill is not scored and they were informed by pharmacy not to cut in half.   They will continue to monitor Patient's sugars.

## 2020-08-03 ENCOUNTER — Other Ambulatory Visit: Payer: Self-pay

## 2020-08-03 DIAGNOSIS — E119 Type 2 diabetes mellitus without complications: Secondary | ICD-10-CM

## 2020-08-03 MED ORDER — ACCU-CHEK FASTCLIX LANCETS MISC
1 refills | Status: DC
  Filled 2020-08-03: qty 204, 90d supply, fill #0
  Filled 2020-08-03: qty 204, 9d supply, fill #0
  Filled 2020-08-05: qty 204, 90d supply, fill #0
  Filled 2020-12-16: qty 204, 90d supply, fill #1

## 2020-08-03 NOTE — Progress Notes (Signed)
Accu chek softclix lancets sent in error. Discontinued and corrected to Accus-CHek FastClix lancets

## 2020-08-05 ENCOUNTER — Other Ambulatory Visit: Payer: Self-pay

## 2020-08-06 ENCOUNTER — Other Ambulatory Visit: Payer: Self-pay

## 2020-08-07 ENCOUNTER — Other Ambulatory Visit: Payer: Self-pay

## 2020-08-10 ENCOUNTER — Other Ambulatory Visit: Payer: Self-pay

## 2020-08-10 ENCOUNTER — Telehealth: Payer: Self-pay | Admitting: Cardiology

## 2020-08-10 ENCOUNTER — Other Ambulatory Visit: Payer: Self-pay | Admitting: Internal Medicine

## 2020-08-10 ENCOUNTER — Telehealth: Payer: Self-pay | Admitting: Internal Medicine

## 2020-08-10 DIAGNOSIS — E119 Type 2 diabetes mellitus without complications: Secondary | ICD-10-CM

## 2020-08-10 MED ORDER — JANUVIA 100 MG PO TABS
100.0000 mg | ORAL_TABLET | Freq: Every day | ORAL | 1 refills | Status: DC
  Filled 2020-08-10: qty 30, 30d supply, fill #0

## 2020-08-10 MED ORDER — SITAGLIPTIN PHOSPHATE 50 MG PO TABS
50.0000 mg | ORAL_TABLET | Freq: Every day | ORAL | 3 refills | Status: DC
  Filled 2020-08-10: qty 30, 30d supply, fill #0

## 2020-08-10 MED ORDER — SITAGLIPTIN PHOSPHATE 25 MG PO TABS
25.0000 mg | ORAL_TABLET | Freq: Every day | ORAL | 5 refills | Status: DC
Start: 2020-08-10 — End: 2020-08-10
  Filled 2020-08-10: qty 30, 30d supply, fill #0

## 2020-08-10 NOTE — Telephone Encounter (Addendum)
Called the patient back. Patient rqst that I talk with his wife Lorriane Shire.  Lorriane Shire sts that when the patient was seen by Dr. Garen Lah on 05/01/20 Irbesartan was started. Patient was already Candesartan. Candesartan was taken off the patients current medication list but was not written on the AVS medication instructions.  The patient has continued to take both Irbesartan and Candesartan. Carey Bullocks that Candesartan should be d/c and Irbesartan 300 mg daily continued. Patients recent cmp 06/26/20 showed normal Cr and K+.  Carey Bullocks that I will fwd the update to Dr. Garen Lah and his nurse Ralene Muskrat, RN. She will receive a call back if additional recommendations are given.

## 2020-08-10 NOTE — Telephone Encounter (Signed)
Please call to discuss Cardesartan and Irbesartan. Patient's wife is not sure if he should be taking both of these.

## 2020-08-10 NOTE — Telephone Encounter (Signed)
What bottle of januvia does patient have? They stated 50 ? Do they want to continue on 50 based on sugars or reduce to 25?    Whatever dose 50 mg daily or 25 mg daily please send into [facility]  Speak with pts wife please  Dr. Gayland Curry

## 2020-08-10 NOTE — Telephone Encounter (Signed)
A1C 6.0 06/2020  These instructions would be too confusing for the pharmacy 2-50 mg for 1 week then 50 mg daily as far as # of pills to dispense  sent in januvia 50 mg daily #90 RF x 3  Stop 25 mg and 100 mg daily   Inform wife/pt

## 2020-08-10 NOTE — Addendum Note (Signed)
Addended by: Orland Mustard on: 08/10/2020 10:12 PM   Modules accepted: Orders

## 2020-08-10 NOTE — Addendum Note (Signed)
Addended by: Thressa Sheller on: 08/10/2020 03:03 PM   Modules accepted: Orders

## 2020-08-10 NOTE — Telephone Encounter (Signed)
Spoke with Patient's wife and she states the Patient was confused on what medication he was taking. Patient was taking the Januvia 50 mg pills twice for a dose of 100 mg in the morning.   Patient's wife states that Patient's sugars have been up and down. States that the Patient has a lot going on at the moment along with some changes and they would like to keep the medications the same for now. They will continue to monitor his sugars. Also states that this needs to be sent in for the 100 mg dose as insurance will not pay if they decrease this and then have to fill early due to his sugars being elevated again.   Will send in Januvia 50 mg pills to take twice. They will do this for a week and then drop his Januvia to 50 mg once daily.   This has been re-sent to the pharmacy and the 25 mg dose has been canceled with the pharmacy.

## 2020-08-10 NOTE — Telephone Encounter (Signed)
Spoke with Patient and he states that he is currently taking the 50 mg Januvia daily. States he is still experiencing the episodes of fatigue and dizziness. Patient would like to try lower dose Januvia.   Januvia 25 mg pills sent to [place]. Patient verbalized understanding

## 2020-08-10 NOTE — Addendum Note (Signed)
Addended by: Thressa Sheller on: 08/10/2020 04:06 PM   Modules accepted: Orders

## 2020-08-11 ENCOUNTER — Other Ambulatory Visit: Payer: Self-pay | Admitting: Internal Medicine

## 2020-08-11 ENCOUNTER — Other Ambulatory Visit: Payer: Self-pay

## 2020-08-11 DIAGNOSIS — E119 Type 2 diabetes mellitus without complications: Secondary | ICD-10-CM

## 2020-08-11 MED ORDER — JANUVIA 100 MG PO TABS
100.0000 mg | ORAL_TABLET | Freq: Every day | ORAL | 3 refills | Status: DC
  Filled 2020-08-11: qty 30, 30d supply, fill #0

## 2020-08-11 NOTE — Telephone Encounter (Signed)
Patients wife informed and verbalized understanding

## 2020-08-15 MED FILL — Fenofibrate Tab 145 MG: ORAL | 30 days supply | Qty: 30 | Fill #1 | Status: AC

## 2020-08-15 MED FILL — Irbesartan Tab 300 MG: ORAL | 30 days supply | Qty: 30 | Fill #1 | Status: AC

## 2020-08-17 ENCOUNTER — Other Ambulatory Visit: Payer: Self-pay

## 2020-08-18 NOTE — Telephone Encounter (Signed)
Noted  

## 2020-08-26 ENCOUNTER — Telehealth: Payer: Self-pay | Admitting: Internal Medicine

## 2020-08-26 ENCOUNTER — Other Ambulatory Visit: Payer: Self-pay | Admitting: Internal Medicine

## 2020-08-26 NOTE — Telephone Encounter (Signed)
Call [facility] and d/c januvia all doses please  Metformin xr should be on 500 mg daily d/c all other freq/doses for this  Thanks Sterling

## 2020-08-28 ENCOUNTER — Other Ambulatory Visit: Payer: Self-pay

## 2020-08-28 MED FILL — Nifedipine Tab ER 24HR Osmotic Release 90 MG: ORAL | 90 days supply | Qty: 90 | Fill #0 | Status: AC

## 2020-08-28 MED FILL — Carvedilol Tab 25 MG: ORAL | 30 days supply | Qty: 60 | Fill #2 | Status: AC

## 2020-08-28 MED FILL — Pantoprazole Sodium EC Tab 40 MG (Base Equiv): ORAL | 90 days supply | Qty: 180 | Fill #0 | Status: AC

## 2020-08-28 MED FILL — Sildenafil Citrate Tab 100 MG: ORAL | 30 days supply | Qty: 6 | Fill #2 | Status: AC

## 2020-08-28 NOTE — Telephone Encounter (Signed)
Pharmacy has been notified.

## 2020-08-31 ENCOUNTER — Other Ambulatory Visit: Payer: Self-pay

## 2020-09-03 ENCOUNTER — Other Ambulatory Visit: Payer: Self-pay

## 2020-09-03 ENCOUNTER — Other Ambulatory Visit: Payer: Self-pay | Admitting: Internal Medicine

## 2020-09-03 DIAGNOSIS — G8929 Other chronic pain: Secondary | ICD-10-CM

## 2020-09-03 DIAGNOSIS — M545 Low back pain, unspecified: Secondary | ICD-10-CM

## 2020-09-03 MED ORDER — TRAMADOL HCL 50 MG PO TABS
50.0000 mg | ORAL_TABLET | Freq: Two times a day (BID) | ORAL | 0 refills | Status: DC | PRN
  Filled 2020-09-03: qty 20, 5d supply, fill #0

## 2020-09-07 ENCOUNTER — Other Ambulatory Visit: Payer: Self-pay

## 2020-09-07 DIAGNOSIS — E119 Type 2 diabetes mellitus without complications: Secondary | ICD-10-CM

## 2020-09-07 MED ORDER — METFORMIN HCL ER 500 MG PO TB24
500.0000 mg | ORAL_TABLET | Freq: Every day | ORAL | 3 refills | Status: DC
  Filled 2020-09-07: qty 90, 90d supply, fill #0
  Filled 2020-11-28: qty 90, 90d supply, fill #1

## 2020-09-12 ENCOUNTER — Other Ambulatory Visit: Payer: Self-pay | Admitting: Cardiology

## 2020-09-14 ENCOUNTER — Other Ambulatory Visit: Payer: Self-pay

## 2020-09-14 MED ORDER — FENOFIBRATE 145 MG PO TABS
ORAL_TABLET | Freq: Every day | ORAL | 0 refills | Status: DC
  Filled 2020-09-14: qty 30, 30d supply, fill #0

## 2020-09-18 ENCOUNTER — Other Ambulatory Visit: Payer: Self-pay

## 2020-09-18 ENCOUNTER — Ambulatory Visit: Payer: No Typology Code available for payment source | Admitting: Cardiology

## 2020-09-18 ENCOUNTER — Encounter: Payer: Self-pay | Admitting: Cardiology

## 2020-09-18 VITALS — BP 116/72 | HR 72 | Ht 75.0 in | Wt 306.0 lb

## 2020-09-18 DIAGNOSIS — I10 Essential (primary) hypertension: Secondary | ICD-10-CM

## 2020-09-18 DIAGNOSIS — I493 Ventricular premature depolarization: Secondary | ICD-10-CM | POA: Diagnosis not present

## 2020-09-18 DIAGNOSIS — R600 Localized edema: Secondary | ICD-10-CM | POA: Diagnosis not present

## 2020-09-18 DIAGNOSIS — E782 Mixed hyperlipidemia: Secondary | ICD-10-CM

## 2020-09-18 MED ORDER — FUROSEMIDE 20 MG PO TABS
20.0000 mg | ORAL_TABLET | ORAL | 3 refills | Status: DC
  Filled 2020-09-18: qty 30, 60d supply, fill #0

## 2020-09-18 NOTE — Patient Instructions (Signed)
Medication Instructions:  Your physician has recommended you make the following change in your medication:   START taking Lasix 20 MG one tab every other day.  *If you need a refill on your cardiac medications before your next appointment, please call your pharmacy*   Lab Work:   BMP drawn today in office.  2.   Please go to your PCPs office in 1 week (09/25/20)  for another BMP draw. I will be reaching out to them to place the order for you. If you need to schedule those appointments, be sure to call them in advance.   If you have labs (blood work) drawn today and your tests are completely normal, you will receive your results only by: [place] (if you have MyChart) OR A paper copy in the mail If you have any lab test that is abnormal or we need to change your treatment, we will call you to review the results.   Testing/Procedures: None ordered   Follow-Up: At [facility], you and your health needs are our priority.  As part of our continuing mission to provide you with exceptional heart care, we have created designated Provider Care Teams.  These Care Teams include your primary Cardiologist (physician) and Advanced Practice Providers (APPs -  Physician Assistants and Nurse Practitioners) who all work together to provide you with the care you need, when you need it.  We recommend signing up for the patient portal called "MyChart".  Sign up information is provided on this After Visit Summary.  MyChart is used to connect with patients for Virtual Visits (Telemedicine).  Patients are able to view lab/test results, encounter notes, upcoming appointments, etc.  Non-urgent messages can be sent to your provider as well.   To learn more about what you can do with MyChart, go to [email or web address].    Your next appointment:   3 month(s)  The format for your next appointment:   In Person  Provider:   Kate Sable, MD   Other Instructions
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
## 2020-09-19 LAB — BASIC METABOLIC PANEL
BUN/Creatinine Ratio: 13 (ref 9–20)
BUN: 17 mg/dL (ref 6–24)
CO2: 19 mmol/L — ABNORMAL LOW (ref 20–29)
Calcium: 9.5 mg/dL (ref 8.7–10.2)
Chloride: 105 mmol/L (ref 96–106)
Creatinine, Ser: 1.29 mg/dL — ABNORMAL HIGH (ref 0.76–1.27)
Glucose: 128 mg/dL — ABNORMAL HIGH (ref 65–99)
Potassium: 4.1 mmol/L (ref 3.5–5.2)
Sodium: 141 mmol/L (ref 134–144)
eGFR: 65 mL/min/{1.73_m2} (ref >59)

## 2020-09-19 MED FILL — Irbesartan Tab 300 MG: ORAL | 30 days supply | Qty: 30 | Fill #2 | Status: AC

## 2020-09-21 ENCOUNTER — Other Ambulatory Visit: Payer: Self-pay

## 2020-09-29 ENCOUNTER — Other Ambulatory Visit: Payer: Self-pay

## 2020-10-01 ENCOUNTER — Other Ambulatory Visit: Payer: Self-pay

## 2020-10-02 ENCOUNTER — Other Ambulatory Visit (INDEPENDENT_AMBULATORY_CARE_PROVIDER_SITE_OTHER): Payer: No Typology Code available for payment source

## 2020-10-02 ENCOUNTER — Other Ambulatory Visit: Payer: Self-pay

## 2020-10-02 DIAGNOSIS — I10 Essential (primary) hypertension: Secondary | ICD-10-CM

## 2020-10-02 DIAGNOSIS — E782 Mixed hyperlipidemia: Secondary | ICD-10-CM

## 2020-10-02 NOTE — Addendum Note (Signed)
Addended by: Leeanne Rio on: 10/02/2020 08:23 AM   Modules accepted: Orders

## 2020-10-02 NOTE — Addendum Note (Signed)
Addended by: Leeanne Rio on: 10/02/2020 08:24 AM   Modules accepted: Orders

## 2020-10-02 NOTE — Addendum Note (Signed)
Addended by: Leeanne Rio on: 10/02/2020 08:25 AM   Modules accepted: Orders

## 2020-10-03 LAB — BASIC METABOLIC PANEL
BUN/Creatinine Ratio: 14 (ref 9–20)
BUN: 18 mg/dL (ref 6–24)
CO2: 24 mmol/L (ref 20–29)
Calcium: 9.5 mg/dL (ref 8.7–10.2)
Chloride: 104 mmol/L (ref 96–106)
Creatinine, Ser: 1.31 mg/dL — ABNORMAL HIGH (ref 0.76–1.27)
Glucose: 126 mg/dL — ABNORMAL HIGH (ref 65–99)
Potassium: 4.3 mmol/L (ref 3.5–5.2)
Sodium: 138 mmol/L (ref 134–144)
eGFR: 63 mL/min/{1.73_m2} (ref >59)

## 2020-10-03 LAB — LIPID PANEL
Chol/HDL Ratio: 3 ratio (ref 0.0–5.0)
Cholesterol, Total: 127 mg/dL (ref 100–199)
HDL: 42 mg/dL (ref >39)
LDL Chol Calc (NIH): 66 mg/dL (ref 0–99)
Triglycerides: 99 mg/dL (ref 0–149)
VLDL Cholesterol Cal: 19 mg/dL (ref 5–40)

## 2020-10-03 MED FILL — Sildenafil Citrate Tab 100 MG: ORAL | 30 days supply | Qty: 6 | Fill #3 | Status: AC

## 2020-10-03 MED FILL — Carvedilol Tab 25 MG: ORAL | 30 days supply | Qty: 60 | Fill #3 | Status: AC

## 2020-10-05 ENCOUNTER — Other Ambulatory Visit (HOSPITAL_COMMUNITY): Payer: Self-pay

## 2020-10-05 ENCOUNTER — Other Ambulatory Visit: Payer: Self-pay

## 2020-10-09 ENCOUNTER — Other Ambulatory Visit: Payer: Self-pay

## 2020-10-09 MED ORDER — CARVEDILOL 25 MG PO TABS
ORAL_TABLET | Freq: Two times a day (BID) | ORAL | 1 refills | Status: DC
Start: 2020-10-09 — End: 2021-01-29
  Filled 2020-10-31: qty 180, 90d supply, fill #0

## 2020-10-10 ENCOUNTER — Other Ambulatory Visit: Payer: Self-pay | Admitting: Cardiology

## 2020-10-12 ENCOUNTER — Other Ambulatory Visit: Payer: Self-pay

## 2020-10-12 MED ORDER — IRBESARTAN 300 MG PO TABS
ORAL_TABLET | Freq: Every day | ORAL | 2 refills | Status: DC
  Filled 2020-10-12: qty 30, 30d supply, fill #0

## 2020-10-13 ENCOUNTER — Other Ambulatory Visit: Payer: Self-pay

## 2020-10-13 ENCOUNTER — Other Ambulatory Visit: Payer: Self-pay | Admitting: Internal Medicine

## 2020-10-14 ENCOUNTER — Other Ambulatory Visit: Payer: Self-pay | Admitting: Internal Medicine

## 2020-10-14 ENCOUNTER — Other Ambulatory Visit: Payer: Self-pay

## 2020-10-14 MED ORDER — FUROSEMIDE 20 MG PO TABS
20.0000 mg | ORAL_TABLET | ORAL | 3 refills | Status: DC
  Filled 2020-10-14: qty 45, 90d supply, fill #0

## 2020-10-14 MED ORDER — IRBESARTAN 300 MG PO TABS
ORAL_TABLET | Freq: Every day | ORAL | 3 refills | Status: DC
  Filled 2020-10-14: qty 90, fill #0
  Filled 2020-11-14: qty 90, 90d supply, fill #0
  Filled 2021-02-06: qty 90, 90d supply, fill #1
  Filled 2021-05-08: qty 90, 90d supply, fill #2

## 2020-10-14 MED ORDER — NIFEDIPINE ER OSMOTIC RELEASE 90 MG PO TB24
ORAL_TABLET | ORAL | 3 refills | Status: DC
  Filled 2020-10-14: qty 90, fill #0
  Filled 2020-11-28: qty 90, 90d supply, fill #0
  Filled 2021-02-06 – 2021-02-27 (×2): qty 90, 90d supply, fill #1
  Filled 2021-05-29: qty 90, 90d supply, fill #2

## 2020-10-14 MED ORDER — LOVASTATIN 40 MG PO TABS
ORAL_TABLET | ORAL | 3 refills | Status: DC
  Filled 2020-10-14: qty 90, fill #0
  Filled 2020-12-26: qty 90, 90d supply, fill #0
  Filled 2021-03-23: qty 90, 90d supply, fill #1
  Filled 2021-06-26: qty 90, 90d supply, fill #2

## 2020-10-14 MED ORDER — CARVEDILOL 25 MG PO TABS
ORAL_TABLET | Freq: Two times a day (BID) | ORAL | 3 refills | Status: DC
  Filled 2020-10-14: qty 180, fill #0

## 2020-10-14 MED ORDER — FENOFIBRATE 145 MG PO TABS
ORAL_TABLET | Freq: Every day | ORAL | 3 refills | Status: DC
  Filled 2020-10-14: qty 90, 90d supply, fill #0
  Filled 2020-12-26: qty 90, 90d supply, fill #1
  Filled 2021-04-10: qty 90, 90d supply, fill #2
  Filled 2021-06-26: qty 90, 90d supply, fill #3

## 2020-10-15 ENCOUNTER — Telehealth: Payer: Self-pay | Admitting: Internal Medicine

## 2020-10-15 NOTE — Telephone Encounter (Signed)
Patient would like refills of the tramadol sent in for prn use while Dr Olivia Mackie McLean-Scocuzza will be out for leave August and October.   Patient states he does not use this often but would like to avoid running out while Dr Olivia Mackie McLean-Scocuzza is out of office.   Please advise

## 2020-10-19 ENCOUNTER — Other Ambulatory Visit: Payer: Self-pay | Admitting: Internal Medicine

## 2020-10-19 ENCOUNTER — Other Ambulatory Visit: Payer: Self-pay

## 2020-10-19 DIAGNOSIS — G8929 Other chronic pain: Secondary | ICD-10-CM

## 2020-10-19 DIAGNOSIS — M545 Low back pain, unspecified: Secondary | ICD-10-CM

## 2020-10-19 DIAGNOSIS — M25511 Pain in right shoulder: Secondary | ICD-10-CM

## 2020-10-19 MED ORDER — TRAMADOL HCL 50 MG PO TABS
50.0000 mg | ORAL_TABLET | Freq: Two times a day (BID) | ORAL | 2 refills | Status: DC | PRN
  Filled 2020-10-19: qty 30, 8d supply, fill #0
  Filled 2020-12-04: qty 30, 8d supply, fill #1

## 2020-10-19 NOTE — Telephone Encounter (Signed)
Sent tramadol 50-100 mg bid prn #30 rf x 2 not sure if they allow refills on tramadol Can you have pt check with pharmacy to ask this?  He will need to sign pain contract due to needing long term and this being a controlled medication Have him sign please asap since getting refills of this   Thank you

## 2020-10-21 NOTE — Telephone Encounter (Signed)
Patient wife informed and will have Patient come and sign contract. New contract made and printed

## 2020-10-31 MED FILL — Sildenafil Citrate Tab 100 MG: ORAL | 30 days supply | Qty: 6 | Fill #4 | Status: AC

## 2020-11-01 ENCOUNTER — Other Ambulatory Visit: Payer: Self-pay

## 2020-11-02 ENCOUNTER — Other Ambulatory Visit: Payer: Self-pay

## 2020-11-16 ENCOUNTER — Other Ambulatory Visit: Payer: Self-pay

## 2020-11-28 MED FILL — Sildenafil Citrate Tab 100 MG: ORAL | 30 days supply | Qty: 6 | Fill #5 | Status: AC

## 2020-11-30 ENCOUNTER — Other Ambulatory Visit: Payer: Self-pay

## 2020-12-01 ENCOUNTER — Other Ambulatory Visit: Payer: Self-pay

## 2020-12-07 ENCOUNTER — Other Ambulatory Visit: Payer: Self-pay

## 2020-12-16 ENCOUNTER — Other Ambulatory Visit: Payer: Self-pay

## 2020-12-17 ENCOUNTER — Other Ambulatory Visit: Payer: Self-pay

## 2020-12-25 ENCOUNTER — Encounter: Payer: Self-pay | Admitting: Cardiology

## 2020-12-25 ENCOUNTER — Ambulatory Visit: Payer: No Typology Code available for payment source | Admitting: Cardiology

## 2020-12-25 ENCOUNTER — Other Ambulatory Visit: Payer: Self-pay

## 2020-12-25 VITALS — BP 138/90 | HR 69 | Ht 75.0 in | Wt 307.0 lb

## 2020-12-25 DIAGNOSIS — R600 Localized edema: Secondary | ICD-10-CM

## 2020-12-25 DIAGNOSIS — E782 Mixed hyperlipidemia: Secondary | ICD-10-CM

## 2020-12-25 DIAGNOSIS — I10 Essential (primary) hypertension: Secondary | ICD-10-CM | POA: Diagnosis not present

## 2020-12-25 MED ORDER — FUROSEMIDE 20 MG PO TABS: 20.0000 mg | ORAL_TABLET | Freq: Every day | ORAL | 3 refills | Status: DC | PRN
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
## 2020-12-25 NOTE — Patient Instructions (Addendum)

## 2020-12-26 MED FILL — Sildenafil Citrate Tab 100 MG: ORAL | 30 days supply | Qty: 6 | Fill #6 | Status: AC

## 2020-12-28 ENCOUNTER — Other Ambulatory Visit: Payer: Self-pay

## 2020-12-30 ENCOUNTER — Other Ambulatory Visit: Payer: Self-pay

## 2021-01-29 ENCOUNTER — Ambulatory Visit (INDEPENDENT_AMBULATORY_CARE_PROVIDER_SITE_OTHER): Payer: No Typology Code available for payment source | Admitting: Internal Medicine

## 2021-01-29 ENCOUNTER — Other Ambulatory Visit: Payer: Self-pay

## 2021-01-29 ENCOUNTER — Encounter: Payer: Self-pay | Admitting: Internal Medicine

## 2021-01-29 VITALS — BP 126/82 | HR 73 | Temp 97.9°F | Ht 75.0 in | Wt 305.4 lb

## 2021-01-29 DIAGNOSIS — B353 Tinea pedis: Secondary | ICD-10-CM

## 2021-01-29 DIAGNOSIS — M25511 Pain in right shoulder: Secondary | ICD-10-CM

## 2021-01-29 DIAGNOSIS — G8929 Other chronic pain: Secondary | ICD-10-CM

## 2021-01-29 DIAGNOSIS — I152 Hypertension secondary to endocrine disorders: Secondary | ICD-10-CM

## 2021-01-29 DIAGNOSIS — Z1389 Encounter for screening for other disorder: Secondary | ICD-10-CM | POA: Diagnosis not present

## 2021-01-29 DIAGNOSIS — Z1329 Encounter for screening for other suspected endocrine disorder: Secondary | ICD-10-CM | POA: Diagnosis not present

## 2021-01-29 DIAGNOSIS — Z23 Encounter for immunization: Secondary | ICD-10-CM

## 2021-01-29 DIAGNOSIS — M545 Low back pain, unspecified: Secondary | ICD-10-CM

## 2021-01-29 DIAGNOSIS — E1159 Type 2 diabetes mellitus with other circulatory complications: Secondary | ICD-10-CM

## 2021-01-29 LAB — CBC WITH DIFFERENTIAL/PLATELET
Basophils Absolute: 0 10*3/uL (ref 0.0–0.1)
Basophils Relative: 0.6 % (ref 0.0–3.0)
Eosinophils Absolute: 0.1 10*3/uL (ref 0.0–0.7)
Eosinophils Relative: 2.2 % (ref 0.0–5.0)
HCT: 42.5 % (ref 39.0–52.0)
Hemoglobin: 13.6 g/dL (ref 13.0–17.0)
Lymphocytes Relative: 34.2 % (ref 12.0–46.0)
Lymphs Abs: 1.7 10*3/uL (ref 0.7–4.0)
MCHC: 32.1 g/dL (ref 30.0–36.0)
MCV: 73.9 fl — ABNORMAL LOW (ref 78.0–100.0)
Monocytes Absolute: 0.5 10*3/uL (ref 0.1–1.0)
Monocytes Relative: 9.4 % (ref 3.0–12.0)
Neutro Abs: 2.6 10*3/uL (ref 1.4–7.7)
Neutrophils Relative %: 53.6 % (ref 43.0–77.0)
Platelets: 249 10*3/uL (ref 150.0–400.0)
RBC: 5.75 Mil/uL (ref 4.22–5.81)
RDW: 15.1 % (ref 11.5–15.5)
WBC: 4.9 10*3/uL (ref 4.0–10.5)

## 2021-01-29 LAB — COMPREHENSIVE METABOLIC PANEL
ALT: 26 U/L (ref 0–53)
AST: 21 U/L (ref 0–37)
Albumin: 4.4 g/dL (ref 3.5–5.2)
Alkaline Phosphatase: 86 U/L (ref 39–117)
BUN: 19 mg/dL (ref 6–23)
CO2: 25 mEq/L (ref 19–32)
Calcium: 9.6 mg/dL (ref 8.4–10.5)
Chloride: 104 mEq/L (ref 96–112)
Creatinine, Ser: 1.46 mg/dL (ref 0.40–1.50)
GFR: 52.81 mL/min — ABNORMAL LOW (ref >60.00)
Glucose, Bld: 119 mg/dL — ABNORMAL HIGH (ref 70–99)
Potassium: 4.1 mEq/L (ref 3.5–5.1)
Sodium: 138 mEq/L (ref 135–145)
Total Bilirubin: 0.6 mg/dL (ref 0.2–1.2)
Total Protein: 7.8 g/dL (ref 6.0–8.3)

## 2021-01-29 LAB — LIPID PANEL
Cholesterol: 135 mg/dL (ref 0–200)
HDL: 42.6 mg/dL (ref >39.00)
LDL Cholesterol: 72 mg/dL (ref 0–99)
NonHDL: 92.34
Total CHOL/HDL Ratio: 3
Triglycerides: 104 mg/dL (ref 0.0–149.0)
VLDL: 20.8 mg/dL (ref 0.0–40.0)

## 2021-01-29 LAB — HEMOGLOBIN A1C: Hgb A1c MFr Bld: 6.6 % — ABNORMAL HIGH (ref 4.6–6.5)

## 2021-01-29 LAB — TSH: TSH: 4.06 u[IU]/mL (ref 0.35–5.50)

## 2021-01-29 MED ORDER — SHINGRIX 50 MCG/0.5ML IM SUSR: 0.5000 mL | Freq: Once | INTRAMUSCULAR | 0 refills | Status: AC

## 2021-01-29 MED ORDER — PNEUMOCOCCAL 13-VAL CONJ VACC IM SUSP: 0.5000 mL | Freq: Once | INTRAMUSCULAR | 0 refills | Status: AC

## 2021-01-29 MED ORDER — CARVEDILOL 25 MG PO TABS
ORAL_TABLET | Freq: Two times a day (BID) | ORAL | 3 refills | Status: DC
  Filled 2021-01-29: qty 180, 90d supply, fill #0
  Filled 2021-05-08: qty 180, 90d supply, fill #1

## 2021-01-29 MED ORDER — TRAMADOL HCL 50 MG PO TABS
50.0000 mg | ORAL_TABLET | Freq: Two times a day (BID) | ORAL | 2 refills | Status: DC | PRN
  Filled 2021-01-29: qty 30, 8d supply, fill #0
  Filled 2021-05-08: qty 30, 8d supply, fill #1
  Filled 2021-07-18: qty 30, 8d supply, fill #2

## 2021-01-29 NOTE — Progress Notes (Signed)
Hi Dr McLean-Scocuzza,  I was not sure if this plan allowed once per calendar year or once every 12 months, so we had one of our front desk reps contact the insurance.  They were told that they allow annual physical exams once every 12 months.  Therefore, it would be too soon for this patient to receive another one today.  They had their last CPE in Dec 2021.  Thanks, Sharyn Lull

## 2021-01-29 NOTE — Patient Instructions (Addendum)
[phone] Not available [place] [address]    Consider prevnar vaccine    Pneumococcal Conjugate Vaccine (Prevnar 13) Suspension for Injection What is this medication? PNEUMOCOCCAL VACCINE (NEU mo KOK al vak SEEN) is a vaccine used to prevent pneumococcus bacterial infections. These bacteria can cause serious infections like pneumonia, meningitis, and blood infections. This vaccine will lower your chance of getting pneumonia. If you do get pneumonia, it can make your symptoms milder and your illness shorter. This vaccine will not treat an infection and will not cause infection. This vaccine is recommended for infants and young children, adults with certain medical conditions, and adults 41 years or older. This medicine may be used for other purposes; ask your health care provider or pharmacist if you have questions. COMMON BRAND NAME(S): Prevnar, Prevnar 13 What should I tell my care team before I take this medication? They need to know if you have any of these conditions: bleeding problems fever immune system problems an unusual or allergic reaction to pneumococcal vaccine, diphtheria toxoid, other vaccines, latex, other medicines, foods, dyes, or preservatives pregnant or trying to get pregnant breast-feeding How should I use this medication? This vaccine is for injection into a muscle. It is given by a health care professional. A copy of Vaccine Information Statements will be given before each vaccination. Read this sheet carefully each time. The sheet may change frequently. Talk to your pediatrician regarding the use of this medicine in children. While this drug may be prescribed for children as young as 45 weeks old for selected conditions, precautions do apply. Overdosage: If you think you have taken too much of this medicine contact a poison control center or emergency room at once. NOTE: This medicine is only for you. Do not share this medicine  with others. What if I miss a dose? It is important not to miss your dose. Call your doctor or health care professional if you are unable to keep an appointment. What may interact with this medication? medicines for cancer chemotherapy medicines that suppress your immune function steroid medicines like prednisone or cortisone This list may not describe all possible interactions. Give your health care provider a list of all the medicines, herbs, non-prescription drugs, or dietary supplements you use. Also tell them if you smoke, drink alcohol, or use illegal drugs. Some items may interact with your medicine. What should I watch for while using this medication? Mild fever and pain should go away in 3 days or less. Report any unusual symptoms to your doctor or health care professional. What side effects may I notice from receiving this medication? Side effects that you should report to your doctor or health care professional as soon as possible: allergic reactions like skin rash, itching or hives, swelling of the face, lips, or tongue breathing problems confused fast or irregular heartbeat fever over 102 degrees F seizures unusual bleeding or bruising unusual muscle weakness Side effects that usually do not require medical attention (report to your doctor or health care professional if they continue or are bothersome): aches and pains diarrhea fever of 102 degrees F or less headache irritable loss of appetite pain, tender at site where injected trouble sleeping This list may not describe all possible side effects. Call your doctor for medical advice about side effects. You may report side effects to FDA at 1-800-FDA-1088. Where should I keep my medication? This does not apply. This vaccine is given in a clinic, pharmacy, doctor's office, or other health  care setting and will not be stored at home. NOTE: This sheet is a summary. It may not cover all possible information. If you have  questions about this medicine, talk to your doctor, pharmacist, or health care provider.  2022 Elsevier/Gold Standard (2013-12-19 00:00:00)  Shingles  Zoster Vaccine, Recombinant injection What is this medication? ZOSTER VACCINE (ZOS ter vak SEEN) is a vaccine used to reduce the risk of getting shingles. This vaccine is not used to treat shingles or nerve pain from shingles. This medicine may be used for other purposes; ask your health care provider or pharmacist if you have questions. COMMON BRAND NAME(S): [employer] What should I tell my care team before I take this medication? They need to know if you have any of these conditions: cancer immune system problems an unusual or allergic reaction to Zoster vaccine, other medications, foods, dyes, or preservatives pregnant or trying to get pregnant breast-feeding How should I use this medication? This vaccine is injected into a muscle. It is given by a health care provider. A copy of Vaccine Information Statements will be given before each vaccination. Be sure to read this information carefully each time. This sheet may change often. Talk to your health care provider about the use of this vaccine in children. This vaccine is not approved for use in children. Overdosage: If you think you have taken too much of this medicine contact a poison control center or emergency room at once. NOTE: This medicine is only for you. Do not share this medicine with others. What if I miss a dose? Keep appointments for follow-up (booster) doses. It is important not to miss your dose. Call your health care provider if you are unable to keep an appointment. What may interact with this medication? medicines that suppress your immune system medicines to treat cancer steroid medicines like prednisone or cortisone This list may not describe all possible interactions. Give your health care provider a list of all the medicines, herbs, non-prescription drugs, or dietary  supplements you use. Also tell them if you smoke, drink alcohol, or use illegal drugs. Some items may interact with your medicine. What should I watch for while using this medication? Visit your health care provider regularly. This vaccine, like all vaccines, may not fully protect everyone. What side effects may I notice from receiving this medication? Side effects that you should report to your doctor or health care professional as soon as possible: allergic reactions (skin rash, itching or hives; swelling of the face, lips, or tongue) trouble breathing Side effects that usually do not require medical attention (report these to your doctor or health care professional if they continue or are bothersome): chills headache fever nausea pain, redness, or irritation at site where injected tiredness vomiting This list may not describe all possible side effects. Call your doctor for medical advice about side effects. You may report side effects to FDA at 1-800-FDA-1088. Where should I keep my medication? This vaccine is only given by a health care provider. It will not be stored at home. NOTE: This sheet is a summary. It may not cover all possible information. If you have questions about this medicine, talk to your doctor, pharmacist, or health care provider.  2022 Elsevier/Gold Standard (2020-12-01 00:00:00)

## 2021-01-29 NOTE — Progress Notes (Signed)
Sorry, there was some confusion with the wording, and rep has now stated that this insurance will pay physical once per calendar year.  So it would be ok to bill CPE for this patient today since last CPE was in 2021.
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
## 2021-01-30 LAB — URINALYSIS, ROUTINE W REFLEX MICROSCOPIC
Bacteria, UA: NONE SEEN /HPF
Bilirubin Urine: NEGATIVE
Glucose, UA: NEGATIVE
Hgb urine dipstick: NEGATIVE
Hyaline Cast: NONE SEEN /LPF
Ketones, ur: NEGATIVE
Nitrite: NEGATIVE
Protein, ur: NEGATIVE
RBC / HPF: NONE SEEN /HPF (ref 0–2)
Specific Gravity, Urine: 1.011 (ref 1.001–1.035)
Squamous Epithelial / HPF: NONE SEEN /HPF (ref <=5)
WBC, UA: NONE SEEN /HPF (ref 0–5)
pH: 7 (ref 5.0–8.0)

## 2021-01-30 LAB — MICROSCOPIC MESSAGE

## 2021-02-05 ENCOUNTER — Ambulatory Visit: Payer: No Typology Code available for payment source | Admitting: Podiatry

## 2021-02-05 ENCOUNTER — Other Ambulatory Visit: Payer: Self-pay

## 2021-02-05 DIAGNOSIS — M79675 Pain in left toe(s): Secondary | ICD-10-CM | POA: Diagnosis not present

## 2021-02-05 DIAGNOSIS — Z79899 Other long term (current) drug therapy: Secondary | ICD-10-CM | POA: Diagnosis not present

## 2021-02-05 DIAGNOSIS — B353 Tinea pedis: Secondary | ICD-10-CM | POA: Diagnosis not present

## 2021-02-05 DIAGNOSIS — B351 Tinea unguium: Secondary | ICD-10-CM

## 2021-02-05 DIAGNOSIS — M79674 Pain in right toe(s): Secondary | ICD-10-CM

## 2021-02-05 MED ORDER — TERBINAFINE HCL 250 MG PO TABS
250.0000 mg | ORAL_TABLET | Freq: Every day | ORAL | 0 refills | Status: DC
  Filled 2021-02-05: qty 90, 90d supply, fill #0

## 2021-02-05 MED ORDER — CLOTRIMAZOLE-BETAMETHASONE 1-0.05 % EX CREA
1.0000 "application " | TOPICAL_CREAM | Freq: Two times a day (BID) | CUTANEOUS | 3 refills | Status: DC
  Filled 2021-02-05: qty 45, 23d supply, fill #0
  Filled 2021-02-13: qty 45, 14d supply, fill #1
  Filled 2021-02-27: qty 45, 14d supply, fill #2
  Filled 2021-03-23: qty 45, 14d supply, fill #3

## 2021-02-05 NOTE — Progress Notes (Signed)
   HPI: 60 y.o. male presenting today as a new patient for evaluation of chronic athlete's foot to the bilateral feet.  Patient experiences burning and itching sensation.  Patient also states that he has thick dystrophic nails.  All of this began when he was in the [employer].  He presents for further treatment and evaluation  Past Medical History:  Diagnosis Date   Acne    ingrown hair   Allergy    mild    Arthritis    back    Blood transfusion without reported diagnosis    Cancer ([place])    prostate cancer dx'ed 2019    Chronic kidney disease    kidney stones    Depression    Diabetes mellitus without complication (HCC)    type 2    Elevated PSA    GERD (gastroesophageal reflux disease)    History of kidney stones    Hyperlipidemia    Hypertension    OSA on CPAP    OSA on CPAP    Dr. Halford Chessman    Sleep apnea    wears cpap    Trench foot    when was in [place]   Vitamin D deficiency    Vitamin D deficiency      Physical Exam: General: The patient is alert and oriented x3 in no acute distress.  Dermatology: Skin is warm, dry and supple bilateral lower extremities.  Hyperkeratotic skin with peeling noted to the bilateral forefoot.  There is some slight maceration in between the digits.  Hyperkeratotic dystrophic nails also noted 1-5 bilateral.  Vascular: Palpable pedal pulses bilaterally. No edema or erythema noted. Capillary refill within normal limits.  Neurological: Epicritic and protective threshold grossly intact bilaterally.   Musculoskeletal Exam: No pedal deformities noted  Assessment: 1.  Tinea pedis both feet 2.  Onychomycosis of toenails both feet   Plan of Care:  1. Patient evaluated. X-Rays reviewed.  2. Rx lotrisone cr apply BID 3. Rx lamisil 250mg  daily x 90 days 4.  Order placed for hepatic function panel.  Patient denies a history of liver pathology or symptoms.  If there is anything abnormal discontinue the Lamisil 5.  Return to clinic as  needed     Edrick Kins, DPM [facility]  Dr. Edrick Kins, DPM    [address], [place] [address]                Office [phone]  Fax [phone]

## 2021-02-06 ENCOUNTER — Other Ambulatory Visit: Payer: Self-pay | Admitting: Internal Medicine

## 2021-02-06 DIAGNOSIS — E119 Type 2 diabetes mellitus without complications: Secondary | ICD-10-CM

## 2021-02-06 LAB — HEPATIC FUNCTION PANEL
ALT: 26 IU/L (ref 0–44)
AST: 18 IU/L (ref 0–40)
Albumin: 4.3 g/dL (ref 3.8–4.9)
Alkaline Phosphatase: 103 IU/L (ref 44–121)
Bilirubin Total: 0.4 mg/dL (ref 0.0–1.2)
Bilirubin, Direct: 0.12 mg/dL (ref 0.00–0.40)
Total Protein: 7.4 g/dL (ref 6.0–8.5)

## 2021-02-06 MED FILL — Sildenafil Citrate Tab 100 MG: ORAL | 30 days supply | Qty: 6 | Fill #7 | Status: AC

## 2021-02-08 ENCOUNTER — Other Ambulatory Visit: Payer: Self-pay

## 2021-02-08 DIAGNOSIS — E119 Type 2 diabetes mellitus without complications: Secondary | ICD-10-CM

## 2021-02-08 MED ORDER — PANTOPRAZOLE SODIUM 40 MG PO TBEC
40.0000 mg | DELAYED_RELEASE_TABLET | Freq: Every day | ORAL | 1 refills | Status: DC
  Filled 2021-02-08: qty 90, 90d supply, fill #0

## 2021-02-08 MED ORDER — ACCU-CHEK FASTCLIX LANCETS MISC
1 refills | Status: DC
  Filled 2021-02-08 (×2): qty 204, 90d supply, fill #0
  Filled 2021-09-18: qty 204, 90d supply, fill #1

## 2021-02-10 ENCOUNTER — Other Ambulatory Visit: Payer: Self-pay

## 2021-02-15 ENCOUNTER — Other Ambulatory Visit: Payer: Self-pay

## 2021-02-27 MED FILL — Sildenafil Citrate Tab 100 MG: ORAL | 30 days supply | Qty: 6 | Fill #8 | Status: AC

## 2021-03-01 ENCOUNTER — Other Ambulatory Visit: Payer: Self-pay

## 2021-03-02 ENCOUNTER — Other Ambulatory Visit: Payer: Self-pay

## 2021-03-10 ENCOUNTER — Telehealth: Payer: Self-pay | Admitting: Pulmonary Disease

## 2021-03-11 LAB — HM DIABETES EYE EXAM

## 2021-03-12 NOTE — Telephone Encounter (Signed)
I called the patient wife and let her know that  I would fax the report to the number she requested and she was appreciative of the call. Nothing further needed.

## 2021-03-19 ENCOUNTER — Encounter: Payer: Self-pay | Admitting: Internal Medicine

## 2021-03-23 MED FILL — Sildenafil Citrate Tab 100 MG: ORAL | 30 days supply | Qty: 6 | Fill #9 | Status: AC

## 2021-03-24 ENCOUNTER — Other Ambulatory Visit: Payer: Self-pay

## 2021-03-25 ENCOUNTER — Encounter: Payer: Self-pay | Admitting: Internal Medicine

## 2021-03-25 ENCOUNTER — Other Ambulatory Visit: Payer: Self-pay

## 2021-03-25 ENCOUNTER — Other Ambulatory Visit: Payer: Self-pay | Admitting: Internal Medicine

## 2021-03-25 DIAGNOSIS — U071 COVID-19: Secondary | ICD-10-CM

## 2021-03-25 DIAGNOSIS — J40 Bronchitis, not specified as acute or chronic: Secondary | ICD-10-CM

## 2021-03-25 MED ORDER — HYDROCOD POLST-CPM POLST ER 10-8 MG/5ML PO SUER
5.0000 mL | Freq: Every evening | ORAL | 0 refills | Status: DC | PRN
  Filled 2021-03-25: qty 115, 23d supply, fill #0

## 2021-03-25 MED ORDER — PREDNISONE 20 MG PO TABS
40.0000 mg | ORAL_TABLET | Freq: Every day | ORAL | 0 refills | Status: DC
  Filled 2021-03-25: qty 14, 7d supply, fill #0

## 2021-03-25 MED ORDER — AZITHROMYCIN 250 MG PO TABS
ORAL_TABLET | ORAL | 0 refills | Status: AC
  Filled 2021-03-25: qty 6, 5d supply, fill #0

## 2021-03-26 ENCOUNTER — Other Ambulatory Visit: Payer: Self-pay

## 2021-03-31 ENCOUNTER — Other Ambulatory Visit: Payer: Self-pay

## 2021-03-31 ENCOUNTER — Encounter: Payer: Self-pay | Admitting: Internal Medicine

## 2021-03-31 ENCOUNTER — Ambulatory Visit (INDEPENDENT_AMBULATORY_CARE_PROVIDER_SITE_OTHER): Payer: No Typology Code available for payment source | Admitting: Internal Medicine

## 2021-03-31 ENCOUNTER — Ambulatory Visit (INDEPENDENT_AMBULATORY_CARE_PROVIDER_SITE_OTHER): Payer: No Typology Code available for payment source

## 2021-03-31 VITALS — BP 134/86 | HR 76 | Temp 97.5°F | Ht 75.0 in | Wt 294.2 lb

## 2021-03-31 DIAGNOSIS — R059 Cough, unspecified: Secondary | ICD-10-CM

## 2021-03-31 DIAGNOSIS — R5383 Other fatigue: Secondary | ICD-10-CM | POA: Diagnosis not present

## 2021-03-31 DIAGNOSIS — R0602 Shortness of breath: Secondary | ICD-10-CM | POA: Diagnosis not present

## 2021-03-31 DIAGNOSIS — R058 Other specified cough: Secondary | ICD-10-CM

## 2021-03-31 DIAGNOSIS — U071 COVID-19: Secondary | ICD-10-CM

## 2021-03-31 DIAGNOSIS — K219 Gastro-esophageal reflux disease without esophagitis: Secondary | ICD-10-CM

## 2021-03-31 DIAGNOSIS — R0789 Other chest pain: Secondary | ICD-10-CM

## 2021-03-31 MED ORDER — PANTOPRAZOLE SODIUM 40 MG PO TBEC
40.0000 mg | DELAYED_RELEASE_TABLET | Freq: Every day | ORAL | 3 refills | Status: DC
  Filled 2021-03-31 – 2021-05-08 (×2): qty 90, 90d supply, fill #0
  Filled 2021-08-08: qty 90, 90d supply, fill #1
  Filled 2021-11-06: qty 90, 90d supply, fill #2
  Filled 2022-01-01 – 2022-02-05 (×2): qty 90, 90d supply, fill #3

## 2021-03-31 MED ORDER — ALBUTEROL SULFATE HFA 108 (90 BASE) MCG/ACT IN AERS
1.0000 | INHALATION_SPRAY | Freq: Four times a day (QID) | RESPIRATORY_TRACT | 0 refills | Status: DC | PRN
  Filled 2021-03-31: qty 18, 30d supply, fill #0

## 2021-03-31 NOTE — Progress Notes (Signed)
Patient was sick 2 weeks ago. Did not test for Covid of flu. States that since then has had a lingering cough (dry), chest congestion, SOB, fatigue, also tingling/numbness in the hands and feet.   Patient was given Tussionex cough syrup, antibiotic, and prednisone. Patient stopped the prednisone after one day due to increased blood sugars of 327. Patient also had the numbness and tingling in the hands and feet at this time.   Patient stopped the Tussionex due to this not helping the congestion.   Patient had two pills of the antibiotic left and did not finish this medication.
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
## 2021-03-31 NOTE — Patient Instructions (Addendum)
If needing prescription strength medication we will need to make an appointment with a provider.  These are over the counter medication options:  Mucinex dm green label for cough or robitussin DM  Multivitamin or below vitamins  Vitamin C 1000 mg daily.  Vitamin D3 4000 Iu (units) daily.  Zinc 100 mg daily.  Quercetin 250-500 mg 2 times per day   Elderberry  Oil of oregano  cepacol or chloroseptic spray Warm salt water gargles +hydrogen peroxide Sugar free cough drops  Warm tea with honey and lemon  Hydration  Try to eat though you dont feel like it   Tylenol or Advil  Nasal saline and Flonase 2 sprays nasal congestion  If sneezing/runny nose over the counter allergy pill claritin,allegra, zyrtec, xyzal Quarantine x 10-14 days 14 days preferred   Monitor pulse oximeter, buy from [facility] if oxygen is less than 90 please go to the hospital.        Are you feeling really sick? Shortness of breath, cough, chest pain?, dizziness? Confusion   If so let me know  If worsening, go to hospital or [facility] clinic Urgent care for further treatment.      Cough, Adult Coughing is a reflex that clears your throat and your airways (respiratory system). Coughing helps to heal and protect your lungs. It is normal to cough occasionally, but a cough that happens with other symptoms or lasts a long time may be a sign of a condition that needs treatment. An acute cough may only last 2-3 weeks, while a chronic cough may last 8 or more weeks. Coughing is commonly caused by: Infection of the respiratory systemby viruses or bacteria. Breathing in substances that irritate your lungs. Allergies. Asthma. Mucus that runs down the back of your throat (postnasal drip). Smoking. Acid backing up from the stomach into the esophagus (gastroesophageal reflux). Certain medicines. Chronic lung problems. Other medical conditions such as heart failure or a blood clot in the lung (pulmonary embolism). Follow these  instructions at home: Medicines Take over-the-counter and prescription medicines only as told by your health care provider. Talk with your health care provider before you take a cough suppressant medicine. Lifestyle  Avoid cigarette smoke. Do not use any products that contain nicotine or tobacco, such as cigarettes, e-cigarettes, and chewing tobacco. If you need help quitting, ask your health care provider. Drink enough fluid to keep your urine pale yellow. Avoid caffeine. Do not drink alcohol if your health care provider tells you not to drink. General instructions  Pay close attention to changes in your cough. Tell your health care provider about them. Always cover your mouth when you cough. Avoid things that make you cough, such as perfume, candles, cleaning products, or campfire or tobacco smoke. If the air is dry, use a cool mist vaporizer or humidifier in your bedroom or your home to help loosen secretions. If your cough is worse at night, try to sleep in a semi-upright position. Rest as needed. Keep all follow-up visits as told by your health care provider. This is important. Contact a health care provider if you: Have new symptoms. Cough up pus. Have a cough that does not get better after 2-3 weeks or gets worse. Cannot control your cough with cough suppressant medicines and you are losing sleep. Have pain that gets worse or pain that is not helped with medicine. Have a fever. Have unexplained weight loss. Have night sweats. Get help right away if: You cough up blood. You have difficulty breathing.  Your heartbeat is very fast. These symptoms may represent a serious problem that is an emergency. Do not wait to see if the symptoms will go away. Get medical help right away. Call your local emergency services (911 in the [place] Do not drive yourself to the hospital. Summary Coughing is a reflex that clears your throat and your airways. It is normal to cough occasionally, but a  cough that happens with other symptoms or lasts a long time may be a sign of a condition that needs treatment. Take over-the-counter and prescription medicines only as told by your health care provider. Always cover your mouth when you cough. Contact a health care provider if you have new symptoms or a cough that does not get better after 2-3 weeks or gets worse. This information is not intended to replace advice given to you by your health care provider. Make sure you discuss any questions you have with your health care provider. Document Revised: 04/02/2018 Document Reviewed: 04/02/2018 Elsevier Patient Education  [place].

## 2021-04-01 ENCOUNTER — Other Ambulatory Visit: Payer: Self-pay

## 2021-04-12 ENCOUNTER — Other Ambulatory Visit: Payer: Self-pay

## 2021-04-26 ENCOUNTER — Other Ambulatory Visit: Payer: Self-pay

## 2021-04-26 ENCOUNTER — Ambulatory Visit: Payer: No Typology Code available for payment source | Admitting: Pulmonary Disease

## 2021-04-26 ENCOUNTER — Encounter: Payer: Self-pay | Admitting: Pulmonary Disease

## 2021-04-26 VITALS — BP 160/100 | HR 83 | Temp 98.0°F | Ht 75.0 in | Wt 297.2 lb

## 2021-04-26 DIAGNOSIS — E669 Obesity, unspecified: Secondary | ICD-10-CM | POA: Diagnosis not present

## 2021-04-26 DIAGNOSIS — G4733 Obstructive sleep apnea (adult) (pediatric): Secondary | ICD-10-CM | POA: Diagnosis not present

## 2021-04-26 DIAGNOSIS — I10 Essential (primary) hypertension: Secondary | ICD-10-CM

## 2021-04-26 DIAGNOSIS — G4730 Sleep apnea, unspecified: Secondary | ICD-10-CM | POA: Diagnosis not present

## 2021-04-26 NOTE — Patient Instructions (Signed)
Will have [employer] arrange for a new auto CPAP machine  Follow up in 4 to 5 months

## 2021-04-26 NOTE — [place] Notes (Signed)
[facility]  Chief Complaint  Patient presents with   Consult    Past Surgical History:  He  has a past surgical history that includes exploratoy abdominal surgery (1985); Inguinal hernia repair (age 61); prostate biopsy ; Robot assisted laparoscopic radical prostatectomy (N/A, 02/26/2018); Lymphadenectomy (Bilateral, 02/26/2018); Colonoscopy; and Polypectomy.  Past Medical History:  Depression, DM, HLD, HTN, Vitamin D deficiency, Prostate cancer  Constitutional:  BP (!) 160/100 (BP Location: Left Arm, Patient Position: Sitting, Cuff Size: Normal)    Pulse 83    Temp 98 F (36.7 C) (Oral)    Ht '6\' 3"'  (1.905 m)    Wt 297 lb 3.2 oz (134.8 kg)    SpO2 98%    BMI 37.15 kg/m   Brief Summary:  Nicholas Carr is a 61 y.o. male with obstructive sleep apnea.  He is a Proofreader.      Subjective:   I last saw him in November 2019.  Weight at that time was 294 lbs.  He uses [employer] for his DME.    He has full face mask.  Mask fits okay.  Pressure settings okay.  He feels his sleep is better and he has more energy.  He goes to sleep at 10 pm.  He falls asleep in about 30 minutes.  He wakes up sometimes times to use the bathroom.  He gets out of bed at 4 am.  He feels rested in the morning.  He denies morning headache.  He does not use anything to help him fall sleep or stay awake.  He denies sleep walking, sleep talking, bruxism, or nightmares.  There is no history of restless legs.  He denies sleep hallucinations, sleep paralysis, or cataplexy.  The Epworth score is 5 out of 24.  Repeat blood pressure 150/98.   Physical Exam:   Appearance - well kempt   ENMT - no sinus tenderness, no oral exudate, no LAN, Mallampati 3 airway, no stridor  Respiratory - equal breath sounds bilaterally, no wheezing or rales  CV - s1s2 regular rate and rhythm, no murmurs  Ext - no clubbing, no edema  Skin - no rashes  Psych - normal mood and  affect   Sleep Tests:  HST 10/04/09 >> AHI 21 HST 12/11/17 >> AHI 21.9, SpO2 low 81% Auto CPAP 03/27/21 to 04/25/21 >> used on 30 of 30 nights with average 6 hrs 12 min.  Average AHI 1.4 with median CPAP 8 and 95 th percentile CPAP 11 cm H2O  Social History:  He  reports that he quit smoking about 24 years ago. His smoking use included cigarettes. He has a 24.00 pack-year smoking history. He has never used smokeless tobacco. He reports current alcohol use. He reports that he does not use drugs.  Family History:  His family history includes Asthma in an other family member; Cancer in an other family member; Diabetes in his brother, maternal grandmother, and another family member; Heart disease in his brother and another family member; Hyperlipidemia in an other family member; Hypertension in an other family member; Kidney disease in his brother; Mental illness in his father; Obesity in an other family member; Prostate cancer in his maternal uncle; Renal Disease in his brother; Sleep apnea in an other family member; Stroke in an other family member.     Assessment/Plan:   Obstructive sleep apnea. - he is compliant with CPAP and reports benefit - he uses [employer] for his DME - his  current device is more than 59 yrs old - will arrange for new auto CPAP 5 to 15 cm H2O - he gets annual CDL physical; next due in December 2023   Obesity. - he is aware of how his weight can impact his health  Hypertension. - advised him to monitor at home - he is followed by Dr. Kate Sable [facility]  Time Spent Involved in Patient Care on Day of Examination:  37 minutes  Follow up:   Patient Instructions  Will have [employer] arrange for a new auto CPAP machine  Follow up in 4 to 5 months  Medication List:   Allergies as of 04/26/2021       Reactions   Boniva [ibandronic Acid] Other (See Comments)   achy   Lorazepam Other (See Comments)   Deep sleep   Nortriptyline Other (See Comments)    A very deep sleep   Wellbutrin [bupropion] Other (See Comments)   nightmares        Medication List        Accurate as of April 26, 2021 11:10 AM. If you have any questions, ask your nurse or doctor.          STOP taking these medications    albuterol 108 (90 Base) MCG/ACT inhaler Commonly known as: VENTOLIN HFA Stopped by: Chesley Mires, MD   furosemide 20 MG tablet Commonly known as: LASIX Stopped by: Chesley Mires, MD       TAKE these medications    Accu-Chek FastClix Lancets Misc CHECK BLOOD SUGAR TWICE A DAY   blood glucose meter kit and supplies Dispense based on patient and insurance preference FreeStyle. Use up to three times daily as directed. (FOR ICD-10 E10.9, E11.9). with 1 year of lancets and strips   carvedilol 25 MG tablet Commonly known as: COREG TAKE 1 TABLET BY MOUTH TWICE DAILY   clotrimazole-betamethasone cream Commonly known as: Lotrisone Apply 1 application topically 2 (two) times daily.   fenofibrate 145 MG tablet Commonly known as: TRICOR TAKE 1 TABLET (145 MG TOTAL) BY MOUTH DAILY.   FreeStyle Lite Devi As directed   FREESTYLE LITE test strip Generic drug: glucose blood Use as instructed   irbesartan 300 MG tablet Commonly known as: AVAPRO TAKE 1 TABLET BY MOUTH DAILY.   lovastatin 40 MG tablet Commonly known as: MEVACOR TAKE 1 TABLET BY MOUTH AT BEDTIME.   metFORMIN 500 MG tablet Commonly known as: GLUCOPHAGE Take 500 mg by mouth daily as needed.   NIFEdipine 90 MG 24 hr tablet Commonly known as: PROCARDIA XL/NIFEDICAL-XL TAKE 1 TABLET (90 MG TOTAL) BY MOUTH DAILY.   pantoprazole 40 MG tablet Commonly known as: PROTONIX Take 1 tablet (40 mg total) by mouth daily. 30 minutes before food   sildenafil 100 MG tablet Commonly known as: VIAGRA TAKE 1 TABLET BY MOUTH AS DIRECTED PRN   terbinafine 250 MG tablet Commonly known as: LamISIL Take 1 tablet (250 mg total) by mouth daily.   traMADol 50 MG  tablet Commonly known as: ULTRAM Take 1-2 tablets (50-100 mg total) by mouth every 12 (twelve) hours as needed.        Signature:  Chesley Mires, MD [place] Pager - [phone] 04/26/2021, 11:10 AM

## 2021-05-08 ENCOUNTER — Other Ambulatory Visit: Payer: Self-pay

## 2021-05-10 ENCOUNTER — Other Ambulatory Visit: Payer: Self-pay

## 2021-05-11 ENCOUNTER — Other Ambulatory Visit: Payer: Self-pay

## 2021-05-11 MED ORDER — SILDENAFIL CITRATE 100 MG PO TABS
ORAL_TABLET | ORAL | 11 refills | Status: DC
  Filled 2021-05-11: qty 6, 30d supply, fill #0
  Filled 2021-05-29: qty 6, 30d supply, fill #1
  Filled 2021-06-26: qty 6, 30d supply, fill #2
  Filled 2021-07-18: qty 6, 30d supply, fill #3
  Filled 2021-09-18: qty 6, 30d supply, fill #4
  Filled 2021-10-10: qty 6, 30d supply, fill #5
  Filled 2021-11-06: qty 6, 30d supply, fill #6
  Filled 2021-12-25: qty 6, 30d supply, fill #7
  Filled 2022-01-22: qty 6, 30d supply, fill #8
  Filled 2022-02-26 – 2022-03-10 (×2): qty 6, 30d supply, fill #9
  Filled 2022-04-01: qty 6, 30d supply, fill #10
  Filled 2022-04-30: qty 6, 30d supply, fill #11

## 2021-05-31 ENCOUNTER — Other Ambulatory Visit: Payer: Self-pay

## 2021-06-01 ENCOUNTER — Other Ambulatory Visit: Payer: Self-pay

## 2021-06-04 ENCOUNTER — Other Ambulatory Visit: Payer: Self-pay

## 2021-06-17 ENCOUNTER — Ambulatory Visit: Payer: No Typology Code available for payment source | Admitting: Cardiology

## 2021-06-26 ENCOUNTER — Other Ambulatory Visit: Payer: Self-pay | Admitting: Internal Medicine

## 2021-06-28 ENCOUNTER — Other Ambulatory Visit: Payer: Self-pay

## 2021-06-28 MED ORDER — METFORMIN HCL 500 MG PO TABS
500.0000 mg | ORAL_TABLET | Freq: Every day | ORAL | 1 refills | Status: DC | PRN
  Filled 2021-06-28: qty 90, 90d supply, fill #0

## 2021-07-19 ENCOUNTER — Encounter: Payer: Self-pay | Admitting: Pharmacist

## 2021-07-19 ENCOUNTER — Other Ambulatory Visit: Payer: Self-pay

## 2021-07-20 ENCOUNTER — Other Ambulatory Visit: Payer: Self-pay

## 2021-07-22 ENCOUNTER — Other Ambulatory Visit: Payer: Self-pay

## 2021-07-30 ENCOUNTER — Ambulatory Visit: Payer: No Typology Code available for payment source | Admitting: Internal Medicine

## 2021-08-04 ENCOUNTER — Encounter: Payer: Self-pay | Admitting: Internal Medicine

## 2021-08-04 ENCOUNTER — Other Ambulatory Visit: Payer: Self-pay

## 2021-08-04 ENCOUNTER — Telehealth: Payer: Self-pay

## 2021-08-04 ENCOUNTER — Telehealth (INDEPENDENT_AMBULATORY_CARE_PROVIDER_SITE_OTHER): Payer: No Typology Code available for payment source | Admitting: Internal Medicine

## 2021-08-04 DIAGNOSIS — E1159 Type 2 diabetes mellitus with other circulatory complications: Secondary | ICD-10-CM

## 2021-08-04 DIAGNOSIS — M545 Low back pain, unspecified: Secondary | ICD-10-CM | POA: Diagnosis not present

## 2021-08-04 DIAGNOSIS — M25511 Pain in right shoulder: Secondary | ICD-10-CM | POA: Diagnosis not present

## 2021-08-04 DIAGNOSIS — I152 Hypertension secondary to endocrine disorders: Secondary | ICD-10-CM

## 2021-08-04 DIAGNOSIS — G8929 Other chronic pain: Secondary | ICD-10-CM

## 2021-08-04 MED ORDER — NIFEDIPINE ER OSMOTIC RELEASE 90 MG PO TB24
ORAL_TABLET | ORAL | 3 refills | Status: DC
  Filled 2021-08-04: qty 90, 90d supply, fill #0
  Filled 2021-11-06: qty 90, 90d supply, fill #1

## 2021-08-04 MED ORDER — TRAMADOL HCL 50 MG PO TABS
50.0000 mg | ORAL_TABLET | Freq: Two times a day (BID) | ORAL | 2 refills | Status: DC | PRN
  Filled 2021-08-04: qty 60, 15d supply, fill #0
  Filled 2021-11-06: qty 60, 15d supply, fill #1
  Filled 2021-12-25: qty 60, 15d supply, fill #2

## 2021-08-04 MED ORDER — METFORMIN HCL 500 MG PO TABS
500.0000 mg | ORAL_TABLET | Freq: Every day | ORAL | 3 refills | Status: DC | PRN
  Filled 2021-08-04: qty 90, 90d supply, fill #0

## 2021-08-04 MED ORDER — CARVEDILOL 25 MG PO TABS
ORAL_TABLET | Freq: Two times a day (BID) | ORAL | 3 refills | Status: DC
  Filled 2021-08-04: qty 180, 90d supply, fill #0
  Filled 2021-11-06: qty 180, 90d supply, fill #1
  Filled 2022-02-05: qty 180, 90d supply, fill #2
  Filled 2022-04-30: qty 180, 90d supply, fill #3

## 2021-08-04 MED ORDER — LOVASTATIN 40 MG PO TABS
ORAL_TABLET | ORAL | 3 refills | Status: DC
  Filled 2021-08-04 – 2021-10-10 (×2): qty 90, 90d supply, fill #0
  Filled 2022-01-01: qty 90, 90d supply, fill #1
  Filled 2022-04-01: qty 90, 90d supply, fill #2
  Filled 2022-07-08: qty 90, 90d supply, fill #3

## 2021-08-04 MED ORDER — IRBESARTAN 300 MG PO TABS
ORAL_TABLET | Freq: Every day | ORAL | 3 refills | Status: DC
  Filled 2021-08-04: qty 90, 90d supply, fill #0
  Filled 2021-11-06: qty 90, 90d supply, fill #1
  Filled 2022-02-05: qty 90, 90d supply, fill #2
  Filled 2022-04-30: qty 90, 90d supply, fill #3

## 2021-08-04 MED ORDER — FENOFIBRATE 145 MG PO TABS
ORAL_TABLET | Freq: Every day | ORAL | 3 refills | Status: DC
  Filled 2021-08-04 – 2021-11-06 (×2): qty 90, 90d supply, fill #0
  Filled 2022-02-05: qty 90, 90d supply, fill #1
  Filled 2022-05-14: qty 90, 90d supply, fill #2
  Filled 2022-08-01: qty 90, 90d supply, fill #3

## 2021-08-04 NOTE — [place] Notes (Signed)
S/w pt to sch late 07/2021 and early 08/2021  and f/u in 6 months. Wife stated she will call office tomorrow to sch.  ?

## 2021-08-04 NOTE — Telephone Encounter (Signed)
S/w pt's wife to sch fasting labs late 07/2021 and early 08/2021  and f/u in 6 months for. She will call office tomorrow to sch, please do so thxs. ?
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
## 2021-08-08 ENCOUNTER — Other Ambulatory Visit: Payer: Self-pay | Admitting: Internal Medicine

## 2021-08-08 ENCOUNTER — Other Ambulatory Visit: Payer: Self-pay

## 2021-08-09 ENCOUNTER — Other Ambulatory Visit: Payer: Self-pay

## 2021-08-09 MED ORDER — FREESTYLE LITE TEST VI STRP
ORAL_STRIP | 12 refills | Status: DC
  Filled 2021-08-09: qty 100, 90d supply, fill #0
  Filled 2021-09-18 – 2021-11-06 (×2): qty 100, 90d supply, fill #1

## 2021-08-10 ENCOUNTER — Other Ambulatory Visit: Payer: Self-pay

## 2021-09-19 ENCOUNTER — Other Ambulatory Visit: Payer: Self-pay

## 2021-09-20 ENCOUNTER — Other Ambulatory Visit: Payer: Self-pay

## 2021-09-24 ENCOUNTER — Ambulatory Visit: Payer: No Typology Code available for payment source | Admitting: Internal Medicine

## 2021-10-06 ENCOUNTER — Other Ambulatory Visit: Payer: Self-pay

## 2021-10-08 ENCOUNTER — Other Ambulatory Visit (INDEPENDENT_AMBULATORY_CARE_PROVIDER_SITE_OTHER): Payer: No Typology Code available for payment source

## 2021-10-08 DIAGNOSIS — I152 Hypertension secondary to endocrine disorders: Secondary | ICD-10-CM

## 2021-10-08 DIAGNOSIS — E1159 Type 2 diabetes mellitus with other circulatory complications: Secondary | ICD-10-CM

## 2021-10-08 LAB — COMPREHENSIVE METABOLIC PANEL
ALT: 27 U/L (ref 0–53)
AST: 19 U/L (ref 0–37)
Albumin: 4.3 g/dL (ref 3.5–5.2)
Alkaline Phosphatase: 106 U/L (ref 39–117)
BUN: 14 mg/dL (ref 6–23)
CO2: 25 mEq/L (ref 19–32)
Calcium: 9.4 mg/dL (ref 8.4–10.5)
Chloride: 105 mEq/L (ref 96–112)
Creatinine, Ser: 1.11 mg/dL (ref 0.40–1.50)
GFR: 73.02 mL/min (ref >60.00)
Glucose, Bld: 118 mg/dL — ABNORMAL HIGH (ref 70–99)
Potassium: 4.1 mEq/L (ref 3.5–5.1)
Sodium: 139 mEq/L (ref 135–145)
Total Bilirubin: 0.5 mg/dL (ref 0.2–1.2)
Total Protein: 7.4 g/dL (ref 6.0–8.3)

## 2021-10-08 LAB — LIPID PANEL
Cholesterol: 143 mg/dL (ref 0–200)
HDL: 45.2 mg/dL (ref >39.00)
LDL Cholesterol: 77 mg/dL (ref 0–99)
NonHDL: 97.96
Total CHOL/HDL Ratio: 3
Triglycerides: 105 mg/dL (ref 0.0–149.0)
VLDL: 21 mg/dL (ref 0.0–40.0)

## 2021-10-08 LAB — CBC WITH DIFFERENTIAL/PLATELET
Basophils Absolute: 0 10*3/uL (ref 0.0–0.1)
Basophils Relative: 0.8 % (ref 0.0–3.0)
Eosinophils Absolute: 0.1 10*3/uL (ref 0.0–0.7)
Eosinophils Relative: 2.6 % (ref 0.0–5.0)
HCT: 42 % (ref 39.0–52.0)
Hemoglobin: 13.3 g/dL (ref 13.0–17.0)
Lymphocytes Relative: 34.7 % (ref 12.0–46.0)
Lymphs Abs: 1.7 10*3/uL (ref 0.7–4.0)
MCHC: 31.7 g/dL (ref 30.0–36.0)
MCV: 74.4 fl — ABNORMAL LOW (ref 78.0–100.0)
Monocytes Absolute: 0.4 10*3/uL (ref 0.1–1.0)
Monocytes Relative: 7.3 % (ref 3.0–12.0)
Neutro Abs: 2.7 10*3/uL (ref 1.4–7.7)
Neutrophils Relative %: 54.6 % (ref 43.0–77.0)
Platelets: 242 10*3/uL (ref 150.0–400.0)
RBC: 5.65 Mil/uL (ref 4.22–5.81)
RDW: 15.6 % — ABNORMAL HIGH (ref 11.5–15.5)
WBC: 5 10*3/uL (ref 4.0–10.5)

## 2021-10-08 LAB — HEMOGLOBIN A1C: Hgb A1c MFr Bld: 6.6 % — ABNORMAL HIGH (ref 4.6–6.5)

## 2021-10-09 LAB — MICROALBUMIN / CREATININE URINE RATIO
Creatinine, Urine: 57 mg/dL (ref 20–320)
Microalb Creat Ratio: 107 mcg/mg creat — ABNORMAL HIGH (ref <30)
Microalb, Ur: 6.1 mg/dL

## 2021-10-10 ENCOUNTER — Other Ambulatory Visit: Payer: Self-pay

## 2021-10-11 ENCOUNTER — Other Ambulatory Visit: Payer: Self-pay

## 2021-10-12 ENCOUNTER — Other Ambulatory Visit: Payer: Self-pay

## 2021-11-07 ENCOUNTER — Other Ambulatory Visit: Payer: Self-pay

## 2021-11-08 ENCOUNTER — Other Ambulatory Visit: Payer: Self-pay

## 2021-11-24 ENCOUNTER — Telehealth: Payer: Self-pay

## 2021-11-24 NOTE — Telephone Encounter (Signed)
Noted.  Will close encounter.  

## 2021-11-24 NOTE — Telephone Encounter (Signed)
Lm for patient to request that he bring SD card to 11/25/2021 visit.

## 2021-11-25 ENCOUNTER — Encounter: Payer: Self-pay | Admitting: Pulmonary Disease

## 2021-11-25 ENCOUNTER — Ambulatory Visit: Payer: No Typology Code available for payment source | Admitting: Pulmonary Disease

## 2021-11-25 VITALS — BP 130/90 | HR 76 | Temp 97.9°F | Ht 75.0 in | Wt 298.6 lb

## 2021-11-25 DIAGNOSIS — E669 Obesity, unspecified: Secondary | ICD-10-CM | POA: Diagnosis not present

## 2021-11-25 DIAGNOSIS — J310 Chronic rhinitis: Secondary | ICD-10-CM

## 2021-11-25 DIAGNOSIS — G4730 Sleep apnea, unspecified: Secondary | ICD-10-CM | POA: Diagnosis not present

## 2021-11-25 DIAGNOSIS — G4733 Obstructive sleep apnea (adult) (pediatric): Secondary | ICD-10-CM | POA: Diagnosis not present

## 2021-11-25 NOTE — Patient Instructions (Addendum)
You can try using saline nasal spray or flonase to help with sinus congestion  Follow up in 1 year

## 2021-11-25 NOTE — Progress Notes (Signed)
[facility]  Chief Complaint  Patient presents with   Follow-up    Past Surgical History:  He  has a past surgical history that includes exploratoy abdominal surgery (1985); Inguinal hernia repair (age 61); prostate biopsy ; Robot assisted laparoscopic radical prostatectomy (N/A, 02/26/2018); Lymphadenectomy (Bilateral, 02/26/2018); Colonoscopy; and Polypectomy.  Past Medical History:  Depression, DM, HLD, HTN, Vitamin D deficiency, Prostate cancer  Constitutional:  BP (!) 130/90 (BP Location: Left Arm, Patient Position: Sitting, Cuff Size: Large)   Pulse 76   Temp 97.9 F (36.6 C) (Oral)   Ht _0  (1.905 m)   Wt 298 lb 9.6 oz (135.4 kg)   SpO2 96%   BMI 37.32 kg/m   Brief Summary:  Nicholas Carr is a 61 y.o. male with obstructive sleep apnea.  He is a Proofreader.      Subjective:   He is using CPAP nightly.  Has a full face mask.  No problem with pressure setting.  He does get some mouth dryness and nasal congestion.  He likes keeping the humidifier setting low - he likes the air to be cooler.  Physical Exam:   Appearance - well kempt   ENMT - no sinus tenderness, no oral exudate, no LAN, Mallampati 3 airway, no stridor  Respiratory - equal breath sounds bilaterally, no wheezing or rales  CV - s1s2 regular rate and rhythm, no murmurs  Ext - no clubbing, no edema  Skin - no rashes  Psych - normal mood and affect    Sleep Tests:  HST 10/04/09 >> AHI 21 HST 12/11/17 >> AHI 21.9, SpO2 low 81% Auto CPAP 08/27/21 to 11/24/21 >> used on 90 of 90 nights with average 5 hrs 12 min.  Average AHI 1.1 with median CPAP 8 and 95 th percentile CPAP 11 cm H2O  Social History:  He  reports that he quit smoking about 24 years ago. His smoking use included cigarettes. He has a 24.00 pack-year smoking history. He has never used smokeless tobacco. He reports current alcohol use. He reports that he does not use drugs.  Family  History:  His family history includes Asthma in an other family member; Cancer in an other family member; Diabetes in his brother, maternal grandmother, and another family member; Heart disease in his brother and another family member; Hyperlipidemia in an other family member; Hypertension in an other family member; Kidney disease in his brother; Mental illness in his father; Obesity in an other family member; Prostate cancer in his maternal uncle; Renal Disease in his brother; Sleep apnea in an other family member; Stroke in an other family member.     Assessment/Plan:   Obstructive sleep apnea. - he is compliant with CPAP and reports benefit - he uses [employer] for his DME - his current CPAP was ordered January 2023 - continue auto CPAP 5 to 15 cm H2O - he gets annual CDL physical; next due in December 2023  CPAP rhinitis. - he can try using nasal saline spray and/or flonase prn   Obesity. - he is aware of how his weight can impact his health   Time Spent Involved in Patient Care on Day of Examination:  27 minutes  Follow up:   Patient Instructions  You can try using saline nasal spray or flonase to help with sinus congestion  Follow up in 1 year  Medication List:   Allergies as of 11/25/2021       Reactions  Boniva [ibandronic Acid] Other (See Comments)   achy   Lorazepam Other (See Comments)   Deep sleep   Nortriptyline Other (See Comments)   A very deep sleep   Wellbutrin [bupropion] Other (See Comments)   nightmares        Medication List        Accurate as of November 25, 2021 10:30 AM. If you have any questions, ask your nurse or doctor.          Accu-Chek FastClix Lancets Misc CHECK BLOOD SUGAR TWICE A DAY   blood glucose meter kit and supplies Dispense based on patient and insurance preference FreeStyle. Use up to three times daily as directed. (FOR ICD-10 E10.9, E11.9). with 1 year of lancets and strips   carvedilol 25 MG tablet Commonly known as:  COREG TAKE 1 TABLET BY MOUTH TWICE DAILY   fenofibrate 145 MG tablet Commonly known as: TRICOR TAKE 1 TABLET (145 MG TOTAL) BY MOUTH DAILY.   FreeStyle Lite Devi As directed   FREESTYLE LITE test strip Generic drug: glucose blood Use as instructed   irbesartan 300 MG tablet Commonly known as: AVAPRO TAKE 1 TABLET BY MOUTH DAILY.   lovastatin 40 MG tablet Commonly known as: MEVACOR TAKE 1 TABLET BY MOUTH AT BEDTIME.   metFORMIN 500 MG tablet Commonly known as: GLUCOPHAGE Take 1 tablet (500 mg total) by mouth daily as needed.   NIFEdipine 90 MG 24 hr tablet Commonly known as: PROCARDIA XL/NIFEDICAL-XL TAKE 1 TABLET (90 MG TOTAL) BY MOUTH DAILY.   pantoprazole 40 MG tablet Commonly known as: PROTONIX Take 1 tablet (40 mg total) by mouth daily. 30 minutes before food   sildenafil 100 MG tablet Commonly known as: VIAGRA TAKE 1 TABLET BY MOUTH AS DIRECTED AS NEEDED (TAKE 1 TABLET BY MOUTH AS DIRECTED PRN)   traMADol 50 MG tablet Commonly known as: ULTRAM Take 1-2 tablets (50-100 mg total) by mouth every 12 (twelve) hours as needed.        Signature:  Chesley Mires, MD [place] Pager - [phone] 11/25/2021, 10:30 AM

## 2021-12-03 ENCOUNTER — Ambulatory Visit: Payer: No Typology Code available for payment source | Attending: Cardiology | Admitting: Cardiology

## 2021-12-03 ENCOUNTER — Encounter: Payer: Self-pay | Admitting: Cardiology

## 2021-12-03 ENCOUNTER — Other Ambulatory Visit: Payer: Self-pay

## 2021-12-03 VITALS — BP 138/90 | HR 74 | Ht 75.0 in | Wt 301.0 lb

## 2021-12-03 DIAGNOSIS — I10 Essential (primary) hypertension: Secondary | ICD-10-CM

## 2021-12-03 DIAGNOSIS — E782 Mixed hyperlipidemia: Secondary | ICD-10-CM | POA: Diagnosis not present

## 2021-12-03 MED ORDER — NIFEDIPINE ER OSMOTIC RELEASE 60 MG PO TB24
120.0000 mg | ORAL_TABLET | Freq: Every day | ORAL | 2 refills | Status: DC
  Filled 2021-12-03 – 2021-12-25 (×2): qty 180, 90d supply, fill #0
  Filled 2022-04-30: qty 180, 90d supply, fill #1
  Filled 2022-07-29: qty 180, 90d supply, fill #2
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
## 2021-12-03 NOTE — Patient Instructions (Signed)
Medication Instructions:  Your physician has recommended you make the following change in your medication:   INCREASE Procardia to 120 mg daily. An Rx has been sent to your pharmacy.  *If you need a refill on your cardiac medications before your next appointment, please call your pharmacy*   Lab Work: None ordered If you have labs (blood work) drawn today and your tests are completely normal, you will receive your results only by: [place] (if you have MyChart) OR A paper copy in the mail If you have any lab test that is abnormal or we need to change your treatment, we will call you to review the results.   Testing/Procedures: None ordered   Follow-Up: At [facility], you and your health needs are our priority.  As part of our continuing mission to provide you with exceptional heart care, we have created designated Provider Care Teams.  These Care Teams include your primary Cardiologist (physician) and Advanced Practice Providers (APPs -  Physician Assistants and Nurse Practitioners) who all work together to provide you with the care you need, when you need it.  We recommend signing up for the patient portal called "MyChart".  Sign up information is provided on this After Visit Summary.  MyChart is used to connect with patients for Virtual Visits (Telemedicine).  Patients are able to view lab/test results, encounter notes, upcoming appointments, etc.  Non-urgent messages can be sent to your provider as well.   To learn more about what you can do with MyChart, go to [email or web address].    Your next appointment:   6 month(s)  The format for your next appointment:   In Person  Provider:   You may see Kate Sable, MD or one of the following Advanced Practice Providers on your designated Care Team:   Murray Hodgkins, NP Christell Faith, PA-C Cadence Kathlen Mody, PA-C Gerrie Nordmann, NP    Other Instructions N/A  Important Information About Sugar

## 2021-12-08 ENCOUNTER — Ambulatory Visit: Payer: No Typology Code available for payment source | Admitting: Family Medicine

## 2021-12-23 ENCOUNTER — Encounter: Payer: Self-pay | Admitting: Gastroenterology

## 2021-12-24 ENCOUNTER — Other Ambulatory Visit: Payer: Self-pay

## 2021-12-25 ENCOUNTER — Other Ambulatory Visit: Payer: Self-pay | Admitting: Internal Medicine

## 2021-12-25 DIAGNOSIS — E119 Type 2 diabetes mellitus without complications: Secondary | ICD-10-CM

## 2021-12-26 ENCOUNTER — Other Ambulatory Visit: Payer: Self-pay

## 2021-12-27 ENCOUNTER — Other Ambulatory Visit: Payer: Self-pay

## 2021-12-27 MED ORDER — ACCU-CHEK FASTCLIX LANCETS MISC
1 refills | Status: AC
  Filled 2021-12-27: qty 204, 90d supply, fill #0
  Filled 2022-04-30: qty 204, 90d supply, fill #1

## 2021-12-28 ENCOUNTER — Other Ambulatory Visit: Payer: Self-pay

## 2022-01-02 ENCOUNTER — Other Ambulatory Visit: Payer: Self-pay

## 2022-01-03 ENCOUNTER — Other Ambulatory Visit: Payer: Self-pay

## 2022-01-05 ENCOUNTER — Other Ambulatory Visit: Payer: Self-pay

## 2022-01-24 ENCOUNTER — Other Ambulatory Visit: Payer: Self-pay

## 2022-02-02 ENCOUNTER — Ambulatory Visit: Payer: No Typology Code available for payment source | Admitting: Family Medicine

## 2022-02-03 ENCOUNTER — Other Ambulatory Visit: Payer: Self-pay

## 2022-02-03 MED ORDER — FREESTYLE LITE TEST VI STRP
ORAL_STRIP | 12 refills | Status: DC
  Filled 2022-02-03: qty 100, 90d supply, fill #0
  Filled 2022-04-30: qty 100, 90d supply, fill #1
  Filled 2022-07-08 – 2022-08-01 (×2): qty 100, 90d supply, fill #2
  Filled 2022-10-29: qty 100, 90d supply, fill #3

## 2022-02-07 ENCOUNTER — Other Ambulatory Visit: Payer: Self-pay

## 2022-02-28 ENCOUNTER — Other Ambulatory Visit: Payer: Self-pay

## 2022-03-03 ENCOUNTER — Encounter: Payer: No Typology Code available for payment source | Admitting: Family Medicine

## 2022-03-08 ENCOUNTER — Other Ambulatory Visit: Payer: Self-pay

## 2022-03-08 ENCOUNTER — Encounter: Payer: Self-pay | Admitting: Gastroenterology

## 2022-03-10 ENCOUNTER — Other Ambulatory Visit: Payer: Self-pay

## 2022-03-14 ENCOUNTER — Ambulatory Visit (INDEPENDENT_AMBULATORY_CARE_PROVIDER_SITE_OTHER): Payer: No Typology Code available for payment source | Admitting: Internal Medicine

## 2022-03-14 ENCOUNTER — Encounter: Payer: Self-pay | Admitting: Internal Medicine

## 2022-03-14 VITALS — BP 112/76 | HR 75 | Temp 97.7°F | Ht 75.0 in | Wt 306.0 lb

## 2022-03-14 DIAGNOSIS — Z6841 Body Mass Index (BMI) 40.0 and over, adult: Secondary | ICD-10-CM

## 2022-03-14 DIAGNOSIS — G4733 Obstructive sleep apnea (adult) (pediatric): Secondary | ICD-10-CM

## 2022-03-14 DIAGNOSIS — E1159 Type 2 diabetes mellitus with other circulatory complications: Secondary | ICD-10-CM

## 2022-03-14 DIAGNOSIS — Z23 Encounter for immunization: Secondary | ICD-10-CM

## 2022-03-14 DIAGNOSIS — E119 Type 2 diabetes mellitus without complications: Secondary | ICD-10-CM

## 2022-03-14 DIAGNOSIS — I152 Hypertension secondary to endocrine disorders: Secondary | ICD-10-CM

## 2022-03-14 LAB — LIPID PANEL
Cholesterol: 132 mg/dL (ref 0–200)
HDL: 44.1 mg/dL (ref >39.00)
LDL Cholesterol: 66 mg/dL (ref 0–99)
NonHDL: 87.67
Total CHOL/HDL Ratio: 3
Triglycerides: 107 mg/dL (ref 0.0–149.0)
VLDL: 21.4 mg/dL (ref 0.0–40.0)

## 2022-03-14 LAB — COMPREHENSIVE METABOLIC PANEL
ALT: 21 U/L (ref 0–53)
AST: 18 U/L (ref 0–37)
Albumin: 4.2 g/dL (ref 3.5–5.2)
Alkaline Phosphatase: 86 U/L (ref 39–117)
BUN: 28 mg/dL — ABNORMAL HIGH (ref 6–23)
CO2: 24 mEq/L (ref 19–32)
Calcium: 9.7 mg/dL (ref 8.4–10.5)
Chloride: 105 mEq/L (ref 96–112)
Creatinine, Ser: 1.44 mg/dL (ref 0.40–1.50)
GFR: 53.27 mL/min — ABNORMAL LOW (ref >60.00)
Glucose, Bld: 129 mg/dL — ABNORMAL HIGH (ref 70–99)
Potassium: 4.3 mEq/L (ref 3.5–5.1)
Sodium: 138 mEq/L (ref 135–145)
Total Bilirubin: 0.5 mg/dL (ref 0.2–1.2)
Total Protein: 7.3 g/dL (ref 6.0–8.3)

## 2022-03-14 LAB — HEMOGLOBIN A1C: Hgb A1c MFr Bld: 6.6 % — ABNORMAL HIGH (ref 4.6–6.5)

## 2022-03-14 NOTE — Assessment & Plan Note (Signed)
Diagnosed by sleep study. He is wearing her CPAP every night a minimum of 6 hours per night and notes improved daytime wakefulness and decreased fatigue

## 2022-03-14 NOTE — Progress Notes (Addendum)
Subjective:  Patient ID: Nicholas Carr, male    DOB: 02/17/1961  Age: 61 y.o. MRN: [id number]  CC: The primary encounter diagnosis was Obstructive sleep apnea. Diagnoses of Controlled type 2 diabetes mellitus without complication, without long-term current use of insulin ([place]), Need for immunization against influenza, Hypertension associated with diabetes ([place]), and Morbid obesity with BMI of 40.0-44.9, adult [facility]) were also pertinent to this visit.   HPI Nicholas Carr presents for transfer of care and follow up on type 2 DM obesity and hypertension  Chief Complaint  Patient presents with   Establish Care   1) T2DM:  He feels generally well, is exercising several times per week and checking blood sugars once daily fasting .   BS have been under 120 fasting   Denies any recent hypoglyemic events.  Taking metformin for appetite suppression .  Following a carbohydrate modified diet 6 days per week. Denies numbness, burning and tingling of extremities. Appetite is good.   Annual eye exam is due now and his last one was Dec 2022.  .  Walking regularly and doing yard work.  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  Home BP readings have been done about once per week and are  generally < 130/80 .  he is avoiding added salt in his diet and walking regularly about 3 times per week for exercise  .   2) h/o prostate CA  managed by Raynelle Bring,  diagnosed 3 yrs ago.  PSA has been checked recently   3) needs letter for work re medication  due in January   4)   Outpatient Medications Prior to Visit  Medication Sig Dispense Refill   Accu-Chek FastClix Lancets MISC CHECK BLOOD SUGAR TWICE A DAY 204 each 1   carvedilol (COREG) 25 MG tablet TAKE 1 TABLET BY MOUTH TWICE DAILY 180 tablet 3   fenofibrate (TRICOR) 145 MG tablet TAKE 1 TABLET (145 MG TOTAL) BY  MOUTH DAILY. 90 tablet 3   glucose blood (FREESTYLE LITE) test strip Use as instructed 100 each 12   irbesartan (AVAPRO) 300 MG tablet TAKE 1 TABLET BY MOUTH DAILY. 90 tablet 3   lovastatin (MEVACOR) 40 MG tablet TAKE 1 TABLET BY MOUTH AT BEDTIME. 90 tablet 3   metFORMIN (GLUCOPHAGE) 500 MG tablet Take 1 tablet (500 mg total) by mouth daily as needed. 90 tablet 3   NIFEdipine (PROCARDIA XL) 60 MG 24 hr tablet Take 2 tablets (120 mg total) by mouth daily. 180 tablet 2   pantoprazole (PROTONIX) 40 MG tablet Take 1 tablet (40 mg total) by mouth daily. 30 minutes before food 90 tablet 3   sildenafil (VIAGRA) 100 MG tablet TAKE 1 TABLET BY MOUTH AS DIRECTED PRN 10 tablet 11   blood glucose meter kit and supplies Dispense based on patient and insurance preference FreeStyle. Use up to three times daily as directed. (FOR ICD-10 E10.9, E11.9). with 1 year of lancets and strips 1 each 0   Blood Glucose Monitoring Suppl (FREESTYLE LITE) DEVI As directed 1 each 1   traMADol (ULTRAM) 50 MG tablet Take 1-2 tablets (50-100 mg total) by mouth every 12 (twelve) hours as needed. 60 tablet 2   No facility-administered medications prior to visit.    Review of Systems;  Patient denies headache, fevers, malaise, unintentional weight loss, skin rash,  eye pain, sinus congestion and sinus pain, sore throat, dysphagia,  hemoptysis , cough, dyspnea, wheezing, chest pain, palpitations, orthopnea, edema, abdominal pain, nausea, melena, diarrhea, constipation, flank pain, dysuria, hematuria, urinary  Frequency, nocturia, numbness, tingling, seizures,  Focal weakness, Loss of consciousness,  Tremor, insomnia, depression, anxiety, and suicidal ideation.      Objective:  BP 112/76   Pulse 75   Temp 97.7 F (36.5 C) (Oral)   Ht _0  (1.905 m)   Wt (!) 306 lb (138.8 kg)   SpO2 96%   BMI 38.25 kg/m   BP Readings from Last 3 Encounters:  03/14/22 112/76  12/03/21 (!) 138/90  11/25/21 (!) 130/90    Wt Readings from  Last 3 Encounters:  03/14/22 (!) 306 lb (138.8 kg)  12/03/21 (!) 301 lb (136.5 kg)  11/25/21 298 lb 9.6 oz (135.4 kg)    Physical Exam Vitals reviewed.  Constitutional:      General: He is not in acute distress.    Appearance: Normal appearance. He is normal weight. He is not ill-appearing, toxic-appearing or diaphoretic.  HENT:     Head: Normocephalic.  Eyes:     General: No scleral icterus.       Right eye: No discharge.        Left eye: No discharge.     Conjunctiva/sclera: Conjunctivae normal.  Cardiovascular:     Rate and Rhythm: Normal rate and regular rhythm.     Heart sounds: Normal heart sounds.  Pulmonary:     Effort: Pulmonary effort is normal. No respiratory distress.     Breath sounds: Normal breath sounds.  Musculoskeletal:        General: Normal range of motion.     Cervical back: Normal range of motion.  Skin:    General: Skin is warm and dry.  Neurological:     General: No focal deficit present.     Mental Status: He is alert and oriented to person, place, and time. Mental status is at baseline.  Psychiatric:        Mood and Affect: Mood normal.        Behavior: Behavior normal.        Thought Content: Thought content normal.        Judgment: Judgment normal.     Lab Results  Component Value Date   HGBA1C 6.6 (H) 03/14/2022   HGBA1C 6.6 (H) 10/08/2021   HGBA1C 6.6 (H) 01/29/2021    Lab Results  Component Value Date   CREATININE 1.44 03/14/2022   CREATININE 1.11 10/08/2021   CREATININE 1.46 01/29/2021    Lab Results  Component Value Date   WBC 5.0 10/08/2021   HGB 13.3 10/08/2021   HCT 42.0 10/08/2021   PLT 242.0 10/08/2021   GLUCOSE 129 (H) 03/14/2022   CHOL 132 03/14/2022   TRIG 107.0 03/14/2022   HDL 44.10 03/14/2022   LDLDIRECT 58.0 10/11/2019   LDLCALC 66 03/14/2022   ALT 21 03/14/2022   AST 18 03/14/2022   NA 138 03/14/2022   K 4.3 03/14/2022   CL 105 03/14/2022   CREATININE 1.44 03/14/2022   BUN 28 (H) 03/14/2022   CO2 24  03/14/2022   TSH 4.06 01/29/2021   PSA 6.55 (H) 09/11/2017   HGBA1C 6.6 (H) 03/14/2022   MICROALBUR 6.1 10/08/2021      Assessment & Plan:  .Obstructive sleep apnea Assessment & Plan: Diagnosed by sleep study. He is wearing her CPAP every night a minimum of 6 hours per night  and notes improved daytime wakefulness and decreased fatigue     Controlled type 2 diabetes mellitus without complication, without long-term current use of insulin ([place]) Assessment & Plan: Currently well-controlled on metformin alone.  Patient is reminded to schedule an annual eye exam and foot exam is normal today. Patient has no microalbuminuria. Patient is tolerating statin therapy for CAD risk reduction and on ACE/ARB for renal protection and hypertension  .  Recommend resuming aspirin twice weekly given his increased risk for CAD  Lab Results  Component Value Date   HGBA1C 6.6 (H) 03/14/2022   Lab Results  Component Value Date   LABMICR Comment 06/15/2018   LABMICR 13.4 06/15/2018   MICROALBUR 6.1 10/08/2021   MICROALBUR 2.3 03/19/2020     Lab Results  Component Value Date   CREATININE 1.44 03/14/2022     Orders: -     Hemoglobin A1c -     Comprehensive metabolic panel -     Lipid panel  Need for immunization against influenza -     Flu Vaccine QUAD 50moIM (Fluarix, Fluzone & Alfiuria Quad PF)  Hypertension associated with diabetes ([place] Assessment & Plan: Well controlled on current regimen of carvedilol, irbesartan, and nifedipine. Renal function stable, no changes today.    Morbid obesity with BMI of 40.0-44.9, adult ([facility] Assessment & Plan: He has gained 8 lbs since August I have addressed  BMI and recommended a low glycemic index diet utilizing smaller more frequent meals to increase metabolism.   Will discuss use of GLP 1 agonist at next visit.       I provided 40 minutes of face-to-face time during this encounter reviewing patient's last visit with Dr MAundra Dubin patient's  most  recent visit with urology,.   recent surgical and non surgical procedures, previous  labs and imaging studies, counseling on currently addressed issues,  and post visit ordering to diagnostics and therapeutics .   Follow-up: No follow-ups on file.   TCrecencio Mc MD

## 2022-03-14 NOTE — Assessment & Plan Note (Addendum)
He has gained 8 lbs since August I have addressed  BMI and recommended a low glycemic index diet utilizing smaller more frequent meals to increase metabolism.   Will discuss use of GLP 1 agonist at next visit.

## 2022-03-14 NOTE — Patient Instructions (Signed)
I enjoyed meeting you today! I'll see you in 6 months (sooner if you develop any issues )  I would postpone the Shingles vaccine until after the holidays  May the Lord give you peace and joy during this holiday season, and may the promise of His return bring you comfort and hope for the future.  Regards,   Deborra Medina, MD

## 2022-03-14 NOTE — Assessment & Plan Note (Addendum)
Currently well-controlled on metformin alone.  Patient is reminded to schedule an annual eye exam and foot exam is normal today. Patient has no microalbuminuria. Patient is tolerating statin therapy for CAD risk reduction and on ACE/ARB for renal protection and hypertension  .  Recommend resuming aspirin twice weekly given his increased risk for CAD  Lab Results  Component Value Date   HGBA1C 6.6 (H) 03/14/2022   Lab Results  Component Value Date   LABMICR Comment 06/15/2018   LABMICR 13.4 06/15/2018   MICROALBUR 6.1 10/08/2021   MICROALBUR 2.3 03/19/2020     Lab Results  Component Value Date   CREATININE 1.44 03/14/2022

## 2022-03-14 NOTE — Assessment & Plan Note (Signed)
Well controlled on current regimen of carvedilol, irbesartan, and nifedipine. Renal function stable, no changes today.

## 2022-03-22 LAB — HM DIABETES EYE EXAM

## 2022-03-24 ENCOUNTER — Telehealth: Payer: Self-pay | Admitting: Pulmonary Disease

## 2022-03-24 NOTE — Telephone Encounter (Signed)
Report in airview ends 09/2021.  Spoke to patient. He stated that he received a new machine. He is unsure if machine has a SD card.  Lm for Melissa with [employer] to request DL.

## 2022-03-25 NOTE — Telephone Encounter (Signed)
Spoke to Sweeny with [employer]. She stated that patient was setup on new machine 04/2021. Report in [place] is from new machine. It is unclear while machine stops recording in July.  [employer] will try to trouble shoot on their end. There may be an issue with the modem and patient may need to being machine into [employer]. Will await call back.

## 2022-03-29 ENCOUNTER — Telehealth: Payer: Self-pay | Admitting: Pulmonary Disease

## 2022-03-29 NOTE — Telephone Encounter (Signed)
Spoke to Garrett for update. She stated cpap team was unable to locate patient in their system, however Melissa was able to locate patient's account. She will send a follow up email with MRN to cpap team. Will await call back.

## 2022-03-29 NOTE — Telephone Encounter (Signed)
Spoke Melissa with [employer]. Melissa stated that RT manager reached out to patient. Patient is going to take SD card to [employer] in [place] for download.   Spoke to patient and verified this information.  Nothing further needed.

## 2022-03-29 NOTE — Telephone Encounter (Signed)
Spoke to patient's spouse, Vanessa(DPR). She is aware that she can bring SD card by our office tomorrow for download. She stated that [employer] is only open from 8:30 to 4:30 and that does not work with her or the patient's schedule.  Nothing further needed.

## 2022-04-04 ENCOUNTER — Other Ambulatory Visit: Payer: Self-pay

## 2022-04-22 ENCOUNTER — Ambulatory Visit (AMBULATORY_SURGERY_CENTER): Payer: Commercial Managed Care - PPO

## 2022-04-22 ENCOUNTER — Other Ambulatory Visit: Payer: Self-pay

## 2022-04-22 VITALS — Ht 75.0 in | Wt 289.0 lb

## 2022-04-22 DIAGNOSIS — Z86010 Personal history of colonic polyps: Secondary | ICD-10-CM

## 2022-04-22 MED ORDER — NA SULFATE-K SULFATE-MG SULF 17.5-3.13-1.6 GM/177ML PO SOLN
1.0000 | Freq: Once | ORAL | 0 refills | Status: AC
  Filled 2022-04-22: qty 354, 1d supply, fill #0

## 2022-04-22 NOTE — Progress Notes (Signed)

## 2022-04-30 ENCOUNTER — Other Ambulatory Visit: Payer: Self-pay

## 2022-05-01 ENCOUNTER — Other Ambulatory Visit: Payer: Self-pay | Admitting: Internal Medicine

## 2022-05-01 ENCOUNTER — Other Ambulatory Visit: Payer: Self-pay

## 2022-05-01 DIAGNOSIS — K219 Gastro-esophageal reflux disease without esophagitis: Secondary | ICD-10-CM

## 2022-05-02 ENCOUNTER — Other Ambulatory Visit: Payer: Self-pay

## 2022-05-02 MED FILL — Pantoprazole Sodium EC Tab 40 MG (Base Equiv): ORAL | 90 days supply | Qty: 90 | Fill #0 | Status: AC

## 2022-05-03 ENCOUNTER — Encounter: Payer: Self-pay | Admitting: Gastroenterology

## 2022-05-13 ENCOUNTER — Encounter: Payer: Self-pay | Admitting: Gastroenterology

## 2022-05-13 ENCOUNTER — Ambulatory Visit (AMBULATORY_SURGERY_CENTER): Payer: Commercial Managed Care - PPO | Admitting: Gastroenterology

## 2022-05-13 VITALS — BP 135/83 | HR 75 | Temp 97.0°F | Resp 12 | Ht 75.0 in | Wt 293.8 lb

## 2022-05-13 DIAGNOSIS — Z860100 Personal history of colon polyps, unspecified: Secondary | ICD-10-CM

## 2022-05-13 DIAGNOSIS — D123 Benign neoplasm of transverse colon: Secondary | ICD-10-CM | POA: Diagnosis not present

## 2022-05-13 DIAGNOSIS — Z09 Encounter for follow-up examination after completed treatment for conditions other than malignant neoplasm: Secondary | ICD-10-CM | POA: Diagnosis not present

## 2022-05-13 DIAGNOSIS — E119 Type 2 diabetes mellitus without complications: Secondary | ICD-10-CM | POA: Diagnosis not present

## 2022-05-13 DIAGNOSIS — Z1211 Encounter for screening for malignant neoplasm of colon: Secondary | ICD-10-CM | POA: Diagnosis not present

## 2022-05-13 DIAGNOSIS — F32A Depression, unspecified: Secondary | ICD-10-CM | POA: Diagnosis not present

## 2022-05-13 DIAGNOSIS — G4733 Obstructive sleep apnea (adult) (pediatric): Secondary | ICD-10-CM | POA: Diagnosis not present

## 2022-05-13 MED ORDER — SODIUM CHLORIDE 0.9 % IV SOLN: 500.0000 mL | Freq: Once | INTRAVENOUS | Status: AC

## 2022-05-13 NOTE — Patient Instructions (Addendum)
YOU HAD AN ENDOSCOPIC PROCEDURE TODAY AT [facility]:   Refer to the procedure report that was given to you for any specific questions about what was found during the examination.  If the procedure report does not answer your questions, please call your gastroenterologist to clarify.  If you requested that your care partner not be given the details of your procedure findings, then the procedure report has been included in a sealed envelope for you to review at your convenience later.  YOU SHOULD EXPECT: Some feelings of bloating in the abdomen. Passage of more gas than usual.  Walking can help get rid of the air that was put into your GI tract during the procedure and reduce the bloating. If you had a lower endoscopy (such as a colonoscopy or flexible sigmoidoscopy) you may notice spotting of blood in your stool or on the toilet paper. If you underwent a bowel prep for your procedure, you may not have a normal bowel movement for a few days.  Please Note:  You might notice some irritation and congestion in your nose or some drainage.  This is from the oxygen used during your procedure.  There is no need for concern and it should clear up in a day or so.  SYMPTOMS TO REPORT IMMEDIATELY:  Following lower endoscopy (colonoscopy or flexible sigmoidoscopy):  Excessive amounts of blood in the stool  Significant tenderness or worsening of abdominal pains  Swelling of the abdomen that is new, acute  Fever of 100F or higher   For urgent or emergent issues, a gastroenterologist can be reached at any hour by calling [phone]. Do not use MyChart messaging for urgent concerns.    DIET:  We do recommend a small meal at first, but then you may proceed to your regular diet.  Drink plenty of fluids but you should avoid alcoholic beverages for 24 hours. Follow a High Fiber Diet.  MEDICATIONS: Continue present medications. Use FiberCon 1-2 tablets by mouth daily.  Please see handouts given  to you by your recovery nurse: Polyps, hemorrhoids, high fiber diet.  FOLLOW UP: Repeat colonoscopy in 3 years for surveillance.  Thank you for allowing [place] to provide for your healthcare needs today.  ACTIVITY:  You should plan to take it easy for the rest of today and you should NOT DRIVE or use heavy machinery until tomorrow (because of the sedation medicines used during the test).    FOLLOW UP: Our staff will call the number listed on your records the next business day following your procedure.  We will call around 7:15- 8:00 am to check on you and address any questions or concerns that you may have regarding the information given to you following your procedure. If we do not reach you, we will leave a message.     If any biopsies were taken you will be contacted by phone or by letter within the next 1-3 weeks.  Please call us at [phone] if you have not heard about the biopsies in 3 weeks.    SIGNATURES/CONFIDENTIALITY: You and/or your care partner have signed paperwork which will be entered into your electronic medical record.  These signatures attest to the fact that that the information above on your After Visit Summary has been reviewed and is understood.  Full responsibility of the confidentiality of this discharge information lies with you and/or your care-partner.

## 2022-05-13 NOTE — Progress Notes (Signed)
Called to room to assist during endoscopic procedure.  Patient ID and intended procedure confirmed with present staff. Received instructions for my participation in the procedure from the performing physician.  

## 2022-05-13 NOTE — Progress Notes (Unsigned)
Sedate, gd SR, tolerated procedure well, VSS, report to RN 

## 2022-05-13 NOTE — Op Note (Signed)
[place] Patient Name: Nicholas Carr Procedure Date: 05/13/2022 12:13 PM MRN: [id number] Endoscopist: Justice Britain , MD, [id number] Age: 62 Referring MD:  Date of Birth: Nov 21, 1960 Gender: Male Account #: [id number] Procedure:                Colonoscopy Indications:              Surveillance: Personal history of adenomatous                            polyps on last colonoscopy > 3 years ago Medicines:                Monitored Anesthesia Care Procedure:                Pre-Anesthesia Assessment:                           - Prior to the procedure, a History and Physical                            was performed, and patient medications and                            allergies were reviewed. The patient's tolerance of                            previous anesthesia was also reviewed. The risks                            and benefits of the procedure and the sedation                            options and risks were discussed with the patient.                            All questions were answered, and informed consent                            was obtained. Prior Anticoagulants: The patient has                            taken no anticoagulant or antiplatelet agents. ASA                            Grade Assessment: II - A patient with mild systemic                            disease. After reviewing the risks and benefits,                            the patient was deemed in satisfactory condition to                            undergo the procedure.  After obtaining informed consent, the colonoscope                            was passed under direct vision. Throughout the                            procedure, the patient's blood pressure, pulse, and                            oxygen saturations were monitored continuously. The                            CF [id number] was introduced through the anus                            and advanced to  the 3 cm into the ileum. The                            colonoscopy was performed without difficulty. The                            patient tolerated the procedure. The quality of the                            bowel preparation was adequate. The terminal ileum,                            ileocecal valve, appendiceal orifice, and rectum                            were photographed. Scope In: 12:19:23 PM Scope Out: 12:32:08 PM Scope Withdrawal Time: 0 hours 9 minutes 34 seconds  Total Procedure Duration: 0 hours 12 minutes 45 seconds  Findings:                 The digital rectal exam findings include                            hemorrhoids. Pertinent negatives include no                            palpable rectal lesions.                           The colon (entire examined portion) revealed                            moderately excessive looping.                           The terminal ileum and ileocecal valve appeared                            normal.  A 10 mm polyp was found in the hepatic flexure. The                            polyp was semi-sessile. The polyp was removed with                            a cold snare. Resection and retrieval were complete.                           Normal mucosa was found in the entire colon                            otherwise.                           Non-bleeding non-thrombosed internal hemorrhoids                            were found during retroflexion, during perianal                            exam and during digital exam. The hemorrhoids were                            Grade II (internal hemorrhoids that prolapse but                            reduce spontaneously). Complications:            No immediate complications. Estimated Blood Loss:     Estimated blood loss was minimal. Impression:               - Hemorrhoids found on digital rectal exam.                           - There was significant looping of the  colon.                           - The examined portion of the ileum was normal.                           - One 10 mm polyp at the hepatic flexure, removed                            with a cold snare. Resected and retrieved.                           - Normal mucosa in the entire examined colon                            otherwise.                           - Non-bleeding non-thrombosed internal hemorrhoids. Recommendation:           - The patient  will be observed post-procedure,                            until all discharge criteria are met.                           - Discharge patient to home.                           - Patient has a contact number available for                            emergencies. The signs and symptoms of potential                            delayed complications were discussed with the                            patient. Return to normal activities tomorrow.                            Written discharge instructions were provided to the                            patient.                           - High fiber diet.                           - Use FiberCon 1-2 tablets PO daily.                           - Continue present medications.                           - Await pathology results.                           - Repeat colonoscopy in 3 years for surveillance.                           - The findings and recommendations were discussed                            with the patient.                           - The findings and recommendations were discussed                            with the patient's family. Justice Britain, MD 05/13/2022 12:36:10 PM

## 2022-05-13 NOTE — Progress Notes (Signed)
Pt's states no medical or surgical changes since previsit or office visit. 

## 2022-05-13 NOTE — Progress Notes (Unsigned)
[place] relieves Contractor
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
## 2022-05-14 ENCOUNTER — Other Ambulatory Visit: Payer: Self-pay

## 2022-05-16 ENCOUNTER — Other Ambulatory Visit: Payer: Self-pay

## 2022-05-16 ENCOUNTER — Telehealth: Payer: Self-pay | Admitting: *Deleted

## 2022-05-16 MED ORDER — SILDENAFIL CITRATE 100 MG PO TABS
100.0000 mg | ORAL_TABLET | ORAL | 11 refills | Status: DC
  Filled 2022-05-16: qty 6, 30d supply, fill #0
  Filled 2022-06-19: qty 6, 30d supply, fill #1
  Filled 2022-07-08: qty 6, 30d supply, fill #2
  Filled 2022-08-01 – 2022-08-19 (×2): qty 6, 30d supply, fill #3
  Filled 2022-09-23: qty 6, 30d supply, fill #4
  Filled 2022-10-27: qty 6, 30d supply, fill #5
  Filled 2022-12-04 – 2022-12-09 (×2): qty 6, 30d supply, fill #6
  Filled 2022-12-25: qty 6, 30d supply, fill #7
  Filled 2023-01-31: qty 6, 30d supply, fill #8
  Filled 2023-03-04: qty 6, 30d supply, fill #9
  Filled 2023-04-01: qty 6, 30d supply, fill #10
  Filled 2023-04-29: qty 6, 30d supply, fill #11

## 2022-05-16 NOTE — Telephone Encounter (Signed)
  Follow up Call-     05/13/2022   11:16 AM  Call back number  Post procedure Call Back phone  # [phone]  Permission to leave phone message Yes     Patient questions:  Do you have a fever, pain , or abdominal swelling? No. Pain Score  0 *  Have you tolerated food without any problems? Yes.    Have you been able to return to your normal activities? Yes.    Do you have any questions about your discharge instructions: Diet   No. Medications  No. Follow up visit  No.  Do you have questions or concerns about your Care? No.  Actions: * If pain score is 4 or above: No action needed, pain <4.

## 2022-05-23 ENCOUNTER — Encounter: Payer: Self-pay | Admitting: Gastroenterology

## 2022-05-24 ENCOUNTER — Other Ambulatory Visit: Payer: Self-pay

## 2022-06-03 ENCOUNTER — Ambulatory Visit: Payer: No Typology Code available for payment source | Admitting: Cardiology

## 2022-06-24 ENCOUNTER — Ambulatory Visit: Payer: Commercial Managed Care - PPO | Attending: Cardiology | Admitting: Cardiology

## 2022-06-24 ENCOUNTER — Encounter: Payer: Self-pay | Admitting: Cardiology

## 2022-06-24 VITALS — BP 134/80 | HR 70 | Ht 75.0 in | Wt 303.8 lb

## 2022-06-24 DIAGNOSIS — E782 Mixed hyperlipidemia: Secondary | ICD-10-CM | POA: Diagnosis not present

## 2022-06-24 DIAGNOSIS — I10 Essential (primary) hypertension: Secondary | ICD-10-CM | POA: Diagnosis not present
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
## 2022-06-24 NOTE — Patient Instructions (Signed)
Medication Instructions:   Your physician recommends that you continue on your current medications as directed. Please refer to the Current Medication list given to you today.  *If you need a refill on your cardiac medications before your next appointment, please call your pharmacy*   Lab Work:  None Ordered  If you have labs (blood work) drawn today and your tests are completely normal, you will receive your results only by: MyChart Message (if you have MyChart) OR A paper copy in the mail If you have any lab test that is abnormal or we need to change your treatment, we will call you to review the results.   Testing/Procedures:  None Ordered   Follow-Up: At [facility], you and your health needs are our priority.  As part of our continuing mission to provide you with exceptional heart care, we have created designated Provider Care Teams.  These Care Teams include your primary Cardiologist (physician) and Advanced Practice Providers (APPs -  Physician Assistants and Nurse Practitioners) who all work together to provide you with the care you need, when you need it.  We recommend signing up for the patient portal called "MyChart".  Sign up information is provided on this After Visit Summary.  MyChart is used to connect with patients for Virtual Visits (Telemedicine).  Patients are able to view lab/test results, encounter notes, upcoming appointments, etc.  Non-urgent messages can be sent to your provider as well.   To learn more about what you can do with MyChart, go to [email or web address].    Your next appointment:  AS NEEDED 

## 2022-06-27 DIAGNOSIS — C61 Malignant neoplasm of prostate: Secondary | ICD-10-CM | POA: Diagnosis not present

## 2022-07-08 ENCOUNTER — Other Ambulatory Visit: Payer: Self-pay

## 2022-07-08 DIAGNOSIS — G4733 Obstructive sleep apnea (adult) (pediatric): Secondary | ICD-10-CM | POA: Diagnosis not present

## 2022-07-08 MED FILL — Pantoprazole Sodium EC Tab 40 MG (Base Equiv): ORAL | 90 days supply | Qty: 90 | Fill #1 | Status: CN

## 2022-07-10 ENCOUNTER — Other Ambulatory Visit: Payer: Self-pay

## 2022-07-11 ENCOUNTER — Other Ambulatory Visit: Payer: Self-pay | Admitting: Internal Medicine

## 2022-07-11 ENCOUNTER — Other Ambulatory Visit: Payer: Self-pay

## 2022-07-11 MED ORDER — IRBESARTAN 300 MG PO TABS
ORAL_TABLET | Freq: Every day | ORAL | 3 refills | Status: DC
  Filled 2022-07-11: qty 90, 90d supply, fill #0
  Filled 2022-10-27: qty 90, 90d supply, fill #1
  Filled 2023-01-31: qty 90, 90d supply, fill #2
  Filled 2023-04-29: qty 90, 90d supply, fill #3

## 2022-07-12 ENCOUNTER — Other Ambulatory Visit: Payer: Self-pay

## 2022-07-13 ENCOUNTER — Other Ambulatory Visit: Payer: Self-pay

## 2022-07-14 ENCOUNTER — Other Ambulatory Visit: Payer: Self-pay

## 2022-07-25 ENCOUNTER — Other Ambulatory Visit: Payer: Self-pay

## 2022-07-29 ENCOUNTER — Other Ambulatory Visit: Payer: Self-pay

## 2022-08-01 ENCOUNTER — Other Ambulatory Visit: Payer: Self-pay

## 2022-08-01 DIAGNOSIS — E1159 Type 2 diabetes mellitus with other circulatory complications: Secondary | ICD-10-CM

## 2022-08-01 MED ORDER — CARVEDILOL 25 MG PO TABS
25.0000 mg | ORAL_TABLET | Freq: Two times a day (BID) | ORAL | 2 refills | Status: DC
Start: 2022-08-01 — End: 2023-05-01
  Filled 2022-08-01: qty 180, 90d supply, fill #0
  Filled 2022-10-29: qty 180, 90d supply, fill #1
  Filled 2023-01-31: qty 180, 90d supply, fill #2

## 2022-08-01 MED FILL — Pantoprazole Sodium EC Tab 40 MG (Base Equiv): ORAL | 90 days supply | Qty: 90 | Fill #1 | Status: AC

## 2022-08-02 ENCOUNTER — Other Ambulatory Visit: Payer: Self-pay

## 2022-09-30 ENCOUNTER — Other Ambulatory Visit: Payer: Self-pay

## 2022-10-03 ENCOUNTER — Other Ambulatory Visit: Payer: Self-pay

## 2022-10-03 ENCOUNTER — Other Ambulatory Visit: Payer: Self-pay | Admitting: Internal Medicine

## 2022-10-03 MED FILL — Lovastatin Tab 40 MG: ORAL | 90 days supply | Qty: 90 | Fill #0 | Status: AC

## 2022-10-05 ENCOUNTER — Other Ambulatory Visit: Payer: Self-pay

## 2022-10-21 DIAGNOSIS — C61 Malignant neoplasm of prostate: Secondary | ICD-10-CM | POA: Diagnosis not present

## 2022-10-28 DIAGNOSIS — N393 Stress incontinence (female) (male): Secondary | ICD-10-CM | POA: Diagnosis not present

## 2022-10-28 DIAGNOSIS — N5201 Erectile dysfunction due to arterial insufficiency: Secondary | ICD-10-CM | POA: Diagnosis not present

## 2022-10-28 DIAGNOSIS — C61 Malignant neoplasm of prostate: Secondary | ICD-10-CM | POA: Diagnosis not present

## 2022-10-29 ENCOUNTER — Other Ambulatory Visit: Payer: Self-pay

## 2022-10-29 MED FILL — Pantoprazole Sodium EC Tab 40 MG (Base Equiv): ORAL | 90 days supply | Qty: 90 | Fill #2 | Status: AC

## 2022-10-30 ENCOUNTER — Other Ambulatory Visit: Payer: Self-pay | Admitting: Internal Medicine

## 2022-10-30 ENCOUNTER — Other Ambulatory Visit: Payer: Self-pay

## 2022-10-31 ENCOUNTER — Other Ambulatory Visit: Payer: Self-pay

## 2022-10-31 ENCOUNTER — Other Ambulatory Visit: Payer: Self-pay | Admitting: *Deleted

## 2022-10-31 MED ORDER — NIFEDIPINE ER OSMOTIC RELEASE 60 MG PO TB24
120.0000 mg | ORAL_TABLET | Freq: Every day | ORAL | 3 refills | Status: DC
  Filled 2022-10-31: qty 180, 90d supply, fill #0
  Filled 2023-01-31: qty 180, 90d supply, fill #1
  Filled 2023-05-04: qty 180, 90d supply, fill #2
  Filled 2023-08-05 – 2023-08-12 (×2): qty 180, 90d supply, fill #3

## 2022-11-01 ENCOUNTER — Other Ambulatory Visit: Payer: Self-pay

## 2022-11-02 ENCOUNTER — Other Ambulatory Visit: Payer: Self-pay

## 2022-11-02 MED FILL — Metformin HCl Tab 500 MG: ORAL | 90 days supply | Qty: 90 | Fill #0 | Status: AC

## 2022-11-02 MED FILL — Fenofibrate Tab 145 MG: ORAL | 90 days supply | Qty: 90 | Fill #0 | Status: AC

## 2022-11-03 ENCOUNTER — Other Ambulatory Visit: Payer: Self-pay

## 2022-12-02 ENCOUNTER — Encounter: Payer: Self-pay | Admitting: Pulmonary Disease

## 2022-12-02 ENCOUNTER — Ambulatory Visit: Payer: Commercial Managed Care - PPO | Admitting: Pulmonary Disease

## 2022-12-02 VITALS — BP 128/80 | HR 70 | Temp 97.6°F | Ht 75.0 in | Wt 299.4 lb

## 2022-12-02 DIAGNOSIS — G4733 Obstructive sleep apnea (adult) (pediatric): Secondary | ICD-10-CM | POA: Diagnosis not present

## 2022-12-02 NOTE — Patient Instructions (Signed)
Will have your auto CPAP setting changed to 5 - 10 cm water pressure.  Follow up in 1 year.

## 2022-12-02 NOTE — Progress Notes (Signed)
[facility]  Chief Complaint  Patient presents with   Follow-up    Cpap was causing some chest discomfort.     Past Surgical History:  He  has a past surgical history that includes exploratoy abdominal surgery (1985); Inguinal hernia repair (age 62); prostate biopsy ; Robot assisted laparoscopic radical prostatectomy (N/A, 02/26/2018); Lymphadenectomy (Bilateral, 02/26/2018); Colonoscopy; and Polypectomy.  Past Medical History:  Depression, DM, HLD, HTN, Vitamin D deficiency, Prostate cancer  Constitutional:  BP 128/80 (BP Location: Right Arm, Cuff Size: Large)   Pulse 70   Temp 97.6 F (36.4 C) (Temporal)   Ht 6\' 3"  (1.905 m)   Wt 299 lb 6.4 oz (135.8 kg)   SpO2 98%   BMI 37.42 kg/m   Brief Summary:  Nicholas Carr is a 62 y.o. male with obstructive sleep apnea.  He is a Engineer, structural.      Subjective:   He uses CPAP nightly.  No issues with mask fit.  Gets a pressure feeling in his chest since he started using his new machine.  Also has more mouth dryness.  Sinus congestion happens occasionally.  Physical Exam:   Appearance - well kempt   ENMT - no sinus tenderness, no oral exudate, no LAN, Mallampati 3 airway, no stridor  Respiratory - equal breath sounds bilaterally, no wheezing or rales  CV - s1s2 regular rate and rhythm, no murmurs  Ext - no clubbing, no edema  Skin - no rashes  Psych - normal mood and affect     Sleep Tests:  HST 10/04/09 >> AHI 21 HST 12/11/17 >> AHI 21.9, SpO2 low 81% Auto CPAP 11/01/22 to 11/30/22 >> used on 30 of 30 nights with average 4 hrs 54 min.  Average AHI 0.9 with median CPAP 8 and 95 th percentile CPAP 12 cm H2O  Social History:  He  reports that he quit smoking about 25 years ago. His smoking use included cigarettes. He started smoking about 41 years ago. He has a 24 pack-year smoking history. He has never used smokeless tobacco. He reports current alcohol use. He reports that  he does not use drugs.  Family History:  His family history includes Asthma in an other family member; Cancer in an other family member; Diabetes in his brother, maternal grandmother, and another family member; Heart disease in his brother and another family member; Hyperlipidemia in an other family member; Hypertension in an other family member; Kidney disease in his brother; Mental illness in his father; Obesity in an other family member; Prostate cancer in his maternal uncle; Renal Disease in his brother; Sleep apnea in an other family member; Stroke in an other family member.     Assessment/Plan:   Obstructive sleep apnea. 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 - he can try using nasal saline spray and/or flonase prn   Time Spent Involved in Patient Care on Day of Examination:  17 minutes  Follow up:   Patient Instructions  Will have your auto CPAP setting changed to 5 - 10 cm water pressure.  Follow up in 1 year.  Medication List:   Allergies as of 12/02/2022  Reactions   Boniva [ibandronic Acid] Other (See Comments)   achy   Lorazepam Other (See Comments)   Deep sleep   Nortriptyline Other (See Comments)   A very deep sleep   Wellbutrin [bupropion] Other (See Comments)   nightmares        Medication List        Accurate as of December 02, 2022  1:53 PM. If you have any questions, ask your nurse or doctor.          Accu-Chek FastClix Lancets Misc CHECK BLOOD SUGAR TWICE A DAY   CALCIUM POLYCARBOPHIL PO Take by mouth.   carvedilol 25 MG tablet Commonly known as: COREG Take 1 tablet (25 mg total) by mouth 2 (two) times daily.   fenofibrate 145 MG tablet Commonly known as: TRICOR TAKE 1  TABLET (145 MG TOTAL) BY MOUTH DAILY.   FREESTYLE LITE test strip Generic drug: glucose blood Use as instructed   irbesartan 300 MG tablet Commonly known as: AVAPRO TAKE 1 TABLET BY MOUTH DAILY.   lovastatin 40 MG tablet Commonly known as: MEVACOR Take 1 tablet (40 mg total) by mouth at bedtime.   metFORMIN 500 MG tablet Commonly known as: GLUCOPHAGE Take 1 tablet (500 mg total) by mouth daily as needed.   NIFEdipine 60 MG 24 hr tablet Commonly known as: Procardia XL Take 2 tablets (120 mg total) by mouth daily.   pantoprazole 40 MG tablet Commonly known as: PROTONIX Take 1 tablet (40 mg total) by mouth daily. 30 minutes before food   sildenafil 100 MG tablet Commonly known as: VIAGRA TAKE 1 TABLET BY MOUTH AS DIRECTED AS NEEDED (Take 1 tablet (100 mg total) by mouth as directed as needed)        Signature:  Coralyn Helling, MD [facility] Pager - [phone] 12/02/2022, 1:53 PM

## 2022-12-04 ENCOUNTER — Other Ambulatory Visit: Payer: Self-pay

## 2022-12-04 MED FILL — Lovastatin Tab 40 MG: ORAL | 90 days supply | Qty: 90 | Fill #1 | Status: AC

## 2022-12-09 ENCOUNTER — Other Ambulatory Visit: Payer: Self-pay

## 2022-12-25 ENCOUNTER — Other Ambulatory Visit: Payer: Self-pay

## 2022-12-30 ENCOUNTER — Encounter: Payer: Self-pay | Admitting: Internal Medicine

## 2022-12-30 ENCOUNTER — Telehealth: Payer: Self-pay

## 2022-12-30 ENCOUNTER — Ambulatory Visit: Payer: Commercial Managed Care - PPO | Admitting: Internal Medicine

## 2022-12-30 VITALS — BP 130/80 | HR 72 | Temp 97.5°F | Ht 75.0 in | Wt 302.6 lb

## 2022-12-30 DIAGNOSIS — E119 Type 2 diabetes mellitus without complications: Secondary | ICD-10-CM

## 2022-12-30 DIAGNOSIS — Z6841 Body Mass Index (BMI) 40.0 and over, adult: Secondary | ICD-10-CM | POA: Diagnosis not present

## 2022-12-30 DIAGNOSIS — Z7985 Long-term (current) use of injectable non-insulin antidiabetic drugs: Secondary | ICD-10-CM | POA: Diagnosis not present

## 2022-12-30 DIAGNOSIS — I10 Essential (primary) hypertension: Secondary | ICD-10-CM

## 2022-12-30 DIAGNOSIS — N1831 Chronic kidney disease, stage 3a: Secondary | ICD-10-CM | POA: Diagnosis not present

## 2022-12-30 DIAGNOSIS — R635 Abnormal weight gain: Secondary | ICD-10-CM

## 2022-12-30 LAB — COMPREHENSIVE METABOLIC PANEL
ALT: 22 U/L (ref 0–53)
AST: 21 U/L (ref 0–37)
Albumin: 4.1 g/dL (ref 3.5–5.2)
Alkaline Phosphatase: 68 U/L (ref 39–117)
BUN: 16 mg/dL (ref 6–23)
CO2: 25 meq/L (ref 19–32)
Calcium: 9.3 mg/dL (ref 8.4–10.5)
Chloride: 105 meq/L (ref 96–112)
Creatinine, Ser: 1.12 mg/dL (ref 0.40–1.50)
GFR: 71.62 mL/min (ref >60.00)
Glucose, Bld: 133 mg/dL — ABNORMAL HIGH (ref 70–99)
Potassium: 3.9 meq/L (ref 3.5–5.1)
Sodium: 139 meq/L (ref 135–145)
Total Bilirubin: 0.5 mg/dL (ref 0.2–1.2)
Total Protein: 7.3 g/dL (ref 6.0–8.3)

## 2022-12-30 LAB — MICROALBUMIN / CREATININE URINE RATIO
Creatinine,U: 42.5 mg/dL
Microalb Creat Ratio: 2.6 mg/g (ref 0.0–30.0)
Microalb, Ur: 1.1 mg/dL (ref 0.0–1.9)

## 2022-12-30 LAB — TSH: TSH: 3.26 u[IU]/mL (ref 0.35–5.50)

## 2022-12-30 LAB — HEMOGLOBIN A1C: Hgb A1c MFr Bld: 6.9 % — ABNORMAL HIGH (ref 4.6–6.5)

## 2022-12-30 NOTE — Telephone Encounter (Signed)
Medication Samples have been provided to the patient.  Drug name: Ozempic       Strength: 0.25 mg        Qty: 1 box  LOT: [id number]  Exp.Date: 01/26/2024  Dosing instructions: Inject .25 mg into skin once weekly  The patient has been instructed regarding the correct time, dose, and frequency of taking this medication, including desired effects and most common side effects.   Nicholas Carr 11:17 AM 12/30/2022

## 2022-12-30 NOTE — Assessment & Plan Note (Addendum)
Currently well-controlled on metformin  prn.  Strongly recommended to consider Ozempic.    Patient is reminded to schedule an annual eye exam and foot exam is normal today. Patient has no microalbuminuria. Patient is tolerating statin therapy for CAD risk reduction and on ACE/ARB for renal protection and hypertension  .  Recommend resuming aspirin twice weekly given his increased risk for CAD  Lab Results  Component Value Date   HGBA1C 6.6 (H) 03/14/2022   Lab Results  Component Value Date   LABMICR Comment 06/15/2018   LABMICR 13.4 06/15/2018   MICROALBUR 6.1 10/08/2021   MICROALBUR 2.3 03/19/2020     Lab Results  Component Value Date   CREATININE 1.44 03/14/2022

## 2022-12-30 NOTE — Progress Notes (Unsigned)
Subjective:  Patient ID: Nicholas Carr, male    DOB: 08-Jun-1960  Age: 62 y.o. MRN: [id number]  CC: {There were no encounter diagnoses. 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     Outpatient Medications Prior to Visit  Medication Sig Dispense Refill   Accu-Chek FastClix Lancets MISC CHECK BLOOD SUGAR TWICE A DAY 204 each 1   CALCIUM POLYCARBOPHIL PO Take by mouth.     carvedilol (COREG) 25 MG tablet Take 1 tablet (25 mg total) by mouth 2 (two) times daily. 180 tablet 2   fenofibrate (TRICOR) 145 MG tablet TAKE 1 TABLET (145 MG TOTAL) BY MOUTH DAILY. 90 tablet 3   glucose blood (FREESTYLE LITE) test strip Use as instructed 100 each 12   irbesartan (AVAPRO) 300 MG tablet TAKE 1 TABLET BY MOUTH DAILY. 90 tablet 3   lovastatin (MEVACOR) 40 MG tablet Take 1 tablet (40 mg total) by mouth at bedtime. 30 tablet 11   metFORMIN (GLUCOPHAGE) 500 MG tablet Take 1 tablet (500 mg total) by mouth daily as needed. 90 tablet 3   NIFEdipine (PROCARDIA XL) 60 MG 24 hr tablet Take 2 tablets (120 mg total) by mouth daily. 180 tablet 3   pantoprazole (PROTONIX) 40 MG tablet Take 1 tablet (40 mg total) by mouth daily. 30 minutes before food 90 tablet 3   sildenafil (VIAGRA) 100 MG tablet Take 1 tablet (100 mg total) by mouth as directed as needed 10 tablet 11   Facility-Administered Medications Prior to Visit  Medication Dose Route Frequency Provider Last Rate Last Admin   0.9 %  sodium chloride infusion  500 mL Intravenous Once Mansouraty, Netty Starring., MD        Review of Systems;  Patient denies headache, fevers, malaise, unintentional weight loss, skin rash, eye pain, sinus congestion and sinus pain, sore throat, dysphagia,  hemoptysis , cough,  dyspnea, wheezing, chest pain, palpitations, orthopnea, edema, abdominal pain, nausea, melena, diarrhea, constipation, flank pain, dysuria, hematuria, urinary  Frequency, nocturia, numbness, tingling, seizures,  Focal weakness, Loss of consciousness,  Tremor, insomnia, depression, anxiety, and suicidal ideation.      Objective:  BP 130/80   Pulse 72   Temp (!) 97.5 F (36.4 C) (Oral)   Ht 6\' 3"  (1.905 m)   Wt (!) 302 lb 9.6 oz (137.3 kg)   SpO2 97%   BMI 37.82 kg/m   BP Readings from Last 3 Encounters:  12/30/22 130/80  12/02/22 128/80  06/24/22 134/80    Wt Readings from Last 3 Encounters:  12/30/22 (!) 302 lb 9.6 oz (137.3 kg)  12/02/22 299 lb 6.4 oz (135.8 kg)  06/24/22 (!) 303 lb 12.8 oz (137.8 kg)    Physical Exam  Lab Results  Component Value Date   HGBA1C 6.6 (H) 03/14/2022   HGBA1C 6.6 (H) 10/08/2021   HGBA1C 6.6 (H) 01/29/2021    Lab Results  Component Value Date   CREATININE 1.44 03/14/2022   CREATININE 1.11 10/08/2021   CREATININE 1.46 01/29/2021    Lab Results  Component Value Date   WBC 5.0 10/08/2021   HGB 13.3 10/08/2021   HCT 42.0 10/08/2021   PLT 242.0 10/08/2021   GLUCOSE 129 (H)  03/14/2022   CHOL 132 03/14/2022   TRIG 107.0 03/14/2022   HDL 44.10 03/14/2022   LDLDIRECT 58.0 10/11/2019   LDLCALC 66 03/14/2022   ALT 21 03/14/2022   AST 18 03/14/2022   NA 138 03/14/2022   K 4.3 03/14/2022   CL 105 03/14/2022   CREATININE 1.44 03/14/2022   BUN 28 (H) 03/14/2022   CO2 24 03/14/2022   TSH 4.06 01/29/2021   PSA 6.55 (H) 09/11/2017   HGBA1C 6.6 (H) 03/14/2022   MICROALBUR 6.1 10/08/2021    CT HEAD WO CONTRAST  Result Date: 12/14/2018 CLINICAL DATA:  Loss of consciousness, lightheaded EXAM: CT HEAD WITHOUT CONTRAST; CT CERVICAL SPINE WITHOUT CONTRAST TECHNIQUE: Contiguous axial images were obtained from the base of the skull through the vertex without intravenous contrast. COMPARISON:  None. FINDINGS: Brain: No evidence of acute  territorial infarction, hemorrhage, hydrocephalus,extra-axial collection or mass lesion/mass effect. Normal gray-white differentiation. Ventricles are normal in size and contour. Vascular: No hyperdense vessel or unexpected calcification. Skull: The skull is intact. No fracture or focal lesion identified. Sinuses/Orbits: There is a right maxillary mucous retention cyst. The remainder of the visualized paranasal sinuses are well aerated. The orbits appear intact. Other: None Cervical spine: Alignment: Physiologic Skull base and vertebrae: Visualized skull base is intact. No atlanto-occipital dissociation. The vertebral body heights are well maintained. No fracture or pathologic osseous lesion seen. Soft tissues and spinal canal: The visualized paraspinal soft tissues are unremarkable. No prevertebral soft tissue swelling is seen. The spinal canal is grossly unremarkable, no large epidural collection or significant canal narrowing. Disc levels: Calcifications along the anterior longitudinal ligament with mild disc osteophyte complex and uncovertebral osteophytes seen at C5-C6. Upper chest: The lung apices are clear. Thoracic inlet is within normal limits. Other: None IMPRESSION: No acute intracranial pathology. No acute fracture or malalignment of the spine. Electronically Signed   By: Jonna Clark M.D.   On: 12/14/2018 18:50   CT CERVICAL SPINE WO CONTRAST  Result Date: 12/14/2018 CLINICAL DATA:  Loss of consciousness, lightheaded EXAM: CT HEAD WITHOUT CONTRAST; CT CERVICAL SPINE WITHOUT CONTRAST TECHNIQUE: Contiguous axial images were obtained from the base of the skull through the vertex without intravenous contrast. COMPARISON:  None. FINDINGS: Brain: No evidence of acute territorial infarction, hemorrhage, hydrocephalus,extra-axial collection or mass lesion/mass effect. Normal gray-white differentiation. Ventricles are normal in size and contour. Vascular: No hyperdense vessel or unexpected calcification.  Skull: The skull is intact. No fracture or focal lesion identified. Sinuses/Orbits: There is a right maxillary mucous retention cyst. The remainder of the visualized paranasal sinuses are well aerated. The orbits appear intact. Other: None Cervical spine: Alignment: Physiologic Skull base and vertebrae: Visualized skull base is intact. No atlanto-occipital dissociation. The vertebral body heights are well maintained. No fracture or pathologic osseous lesion seen. Soft tissues and spinal canal: The visualized paraspinal soft tissues are unremarkable. No prevertebral soft tissue swelling is seen. The spinal canal is grossly unremarkable, no large epidural collection or significant canal narrowing. Disc levels: Calcifications along the anterior longitudinal ligament with mild disc osteophyte complex and uncovertebral osteophytes seen at C5-C6. Upper chest: The lung apices are clear. Thoracic inlet is within normal limits. Other: None IMPRESSION: No acute intracranial pathology. No acute fracture or malalignment of the spine. Electronically Signed   By: Jonna Clark M.D.   On: 12/14/2018 18:50    Assessment & Plan:  .There are no diagnoses linked to this encounter.   I provided 30 minutes of face-to-face time during this encounter  reviewing patient's last visit with me, patient's  most recent visit with cardiology,  nephrology,  and neurology,  recent surgical and non surgical procedures, previous  labs and imaging studies, counseling on currently addressed issues,  and post visit ordering to diagnostics and therapeutics .   Follow-up: No follow-ups on file.   Sherlene Shams, MD

## 2022-12-30 NOTE — Patient Instructions (Addendum)
Please resume a baby aspirin daily  To prevent heart attacks and blood clots  TALK TO VANESSA about starting ozempic to help you lose weight.    We can start you on samples if you decide to try it

## 2022-12-31 LAB — LIPID PANEL W/REFLEX DIRECT LDL
Cholesterol: 126 mg/dL (ref <200)
HDL: 47 mg/dL (ref >=40)
LDL Cholesterol (Calc): 62 mg/dL
Non-HDL Cholesterol (Calc): 79 mg/dL (ref <130)
Total CHOL/HDL Ratio: 2.7 (calc) (ref <5.0)
Triglycerides: 88 mg/dL (ref <150)

## 2022-12-31 NOTE — Assessment & Plan Note (Signed)
I have addressed  BMI and recommended a low glycemic index diet utilizing smaller more frequent meals to increase metabolism. He  willing to start ozempic.  Samples given of starting dose 0..25 mg

## 2023-01-18 ENCOUNTER — Other Ambulatory Visit: Payer: Self-pay

## 2023-01-18 ENCOUNTER — Telehealth: Payer: Self-pay

## 2023-01-18 MED ORDER — OZEMPIC (0.25 OR 0.5 MG/DOSE) 2 MG/3ML ~~LOC~~ SOPN
0.2500 mg | PEN_INJECTOR | SUBCUTANEOUS | 1 refills | Status: DC
Start: 2023-01-18 — End: 2023-06-06
  Filled 2023-01-18: qty 3, 30d supply, fill #0
  Filled 2023-03-18: qty 3, 30d supply, fill #1

## 2023-01-18 NOTE — Telephone Encounter (Signed)
Spoke with pt's wife and she stated that since pt has been taking the Ozempic 0.25 mg his blood sugars are staying in the 90s. Then it will start creeping back up to around 120s when it is getting time for his shot to be due. She also stated that the medication has really curved his appetite to where he doesn't eat much at all.

## 2023-01-18 NOTE — Addendum Note (Signed)
Addended by: Sandy Salaam on: 01/18/2023 05:21 PM   Modules accepted: Orders

## 2023-01-18 NOTE — Telephone Encounter (Signed)
Pt was given a sample and does not have a rx at the pharmacy. I have sent a rx into his pharmacy for the Ozempic 0.25 mg dose.

## 2023-01-19 ENCOUNTER — Other Ambulatory Visit: Payer: Self-pay

## 2023-01-31 MED FILL — Pantoprazole Sodium EC Tab 40 MG (Base Equiv): ORAL | 90 days supply | Qty: 90 | Fill #3 | Status: AC

## 2023-02-06 MED FILL — Fenofibrate Tab 145 MG: ORAL | 90 days supply | Qty: 90 | Fill #1 | Status: AC

## 2023-03-03 DIAGNOSIS — C61 Malignant neoplasm of prostate: Secondary | ICD-10-CM | POA: Diagnosis not present

## 2023-03-07 ENCOUNTER — Telehealth: Payer: Self-pay

## 2023-03-07 ENCOUNTER — Telehealth: Payer: Self-pay | Admitting: Pulmonary Disease

## 2023-03-07 NOTE — Telephone Encounter (Signed)
Letter was given pt's wife.

## 2023-03-07 NOTE — Telephone Encounter (Signed)
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 Please fax to [phone]

## 2023-03-07 NOTE — Telephone Encounter (Signed)
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 I have updated the last letter, printed it and placed it in the quick sign folder.

## 2023-03-07 NOTE — Telephone Encounter (Signed)
Fax number provided was for his wife's work and we do not have a record release on file.  Compliance report has been placed up front for pick up.  Patient is aware and voiced his understanding.  Nothing further needed.

## 2023-03-14 DIAGNOSIS — G4733 Obstructive sleep apnea (adult) (pediatric): Secondary | ICD-10-CM | POA: Diagnosis not present

## 2023-03-18 ENCOUNTER — Other Ambulatory Visit: Payer: Self-pay | Admitting: Family

## 2023-03-20 ENCOUNTER — Other Ambulatory Visit: Payer: Self-pay

## 2023-03-20 ENCOUNTER — Other Ambulatory Visit: Payer: Self-pay | Admitting: Internal Medicine

## 2023-03-20 MED FILL — Glucose Blood Test Strip: 90 days supply | Qty: 100 | Fill #0 | Status: AC

## 2023-03-21 ENCOUNTER — Other Ambulatory Visit: Payer: Self-pay

## 2023-03-31 ENCOUNTER — Other Ambulatory Visit: Payer: Self-pay

## 2023-03-31 DIAGNOSIS — G8929 Other chronic pain: Secondary | ICD-10-CM

## 2023-03-31 NOTE — Telephone Encounter (Signed)
 Last filled by Dr. French Ana on 08/04/2021  Last OV: 12/30/2022 Next OV: 07/28/2023

## 2023-04-01 MED FILL — Lovastatin Tab 40 MG: ORAL | 90 days supply | Qty: 90 | Fill #2 | Status: AC

## 2023-04-02 ENCOUNTER — Encounter: Payer: Self-pay | Admitting: Internal Medicine

## 2023-04-02 ENCOUNTER — Other Ambulatory Visit: Payer: Self-pay

## 2023-04-02 MED ORDER — TRAMADOL HCL 50 MG PO TABS
50.0000 mg | ORAL_TABLET | Freq: Two times a day (BID) | ORAL | 0 refills | Status: DC | PRN
  Filled 2023-04-02: qty 60, 15d supply, fill #0

## 2023-04-03 ENCOUNTER — Other Ambulatory Visit: Payer: Self-pay

## 2023-04-22 IMAGING — DX DG CHEST 2V
2 series · 2 of 2 positions shown · non-contrast
Comparison: None.

CLINICAL DATA: Cough.

EXAM:
CHEST - 2 VIEW

[chest pa]
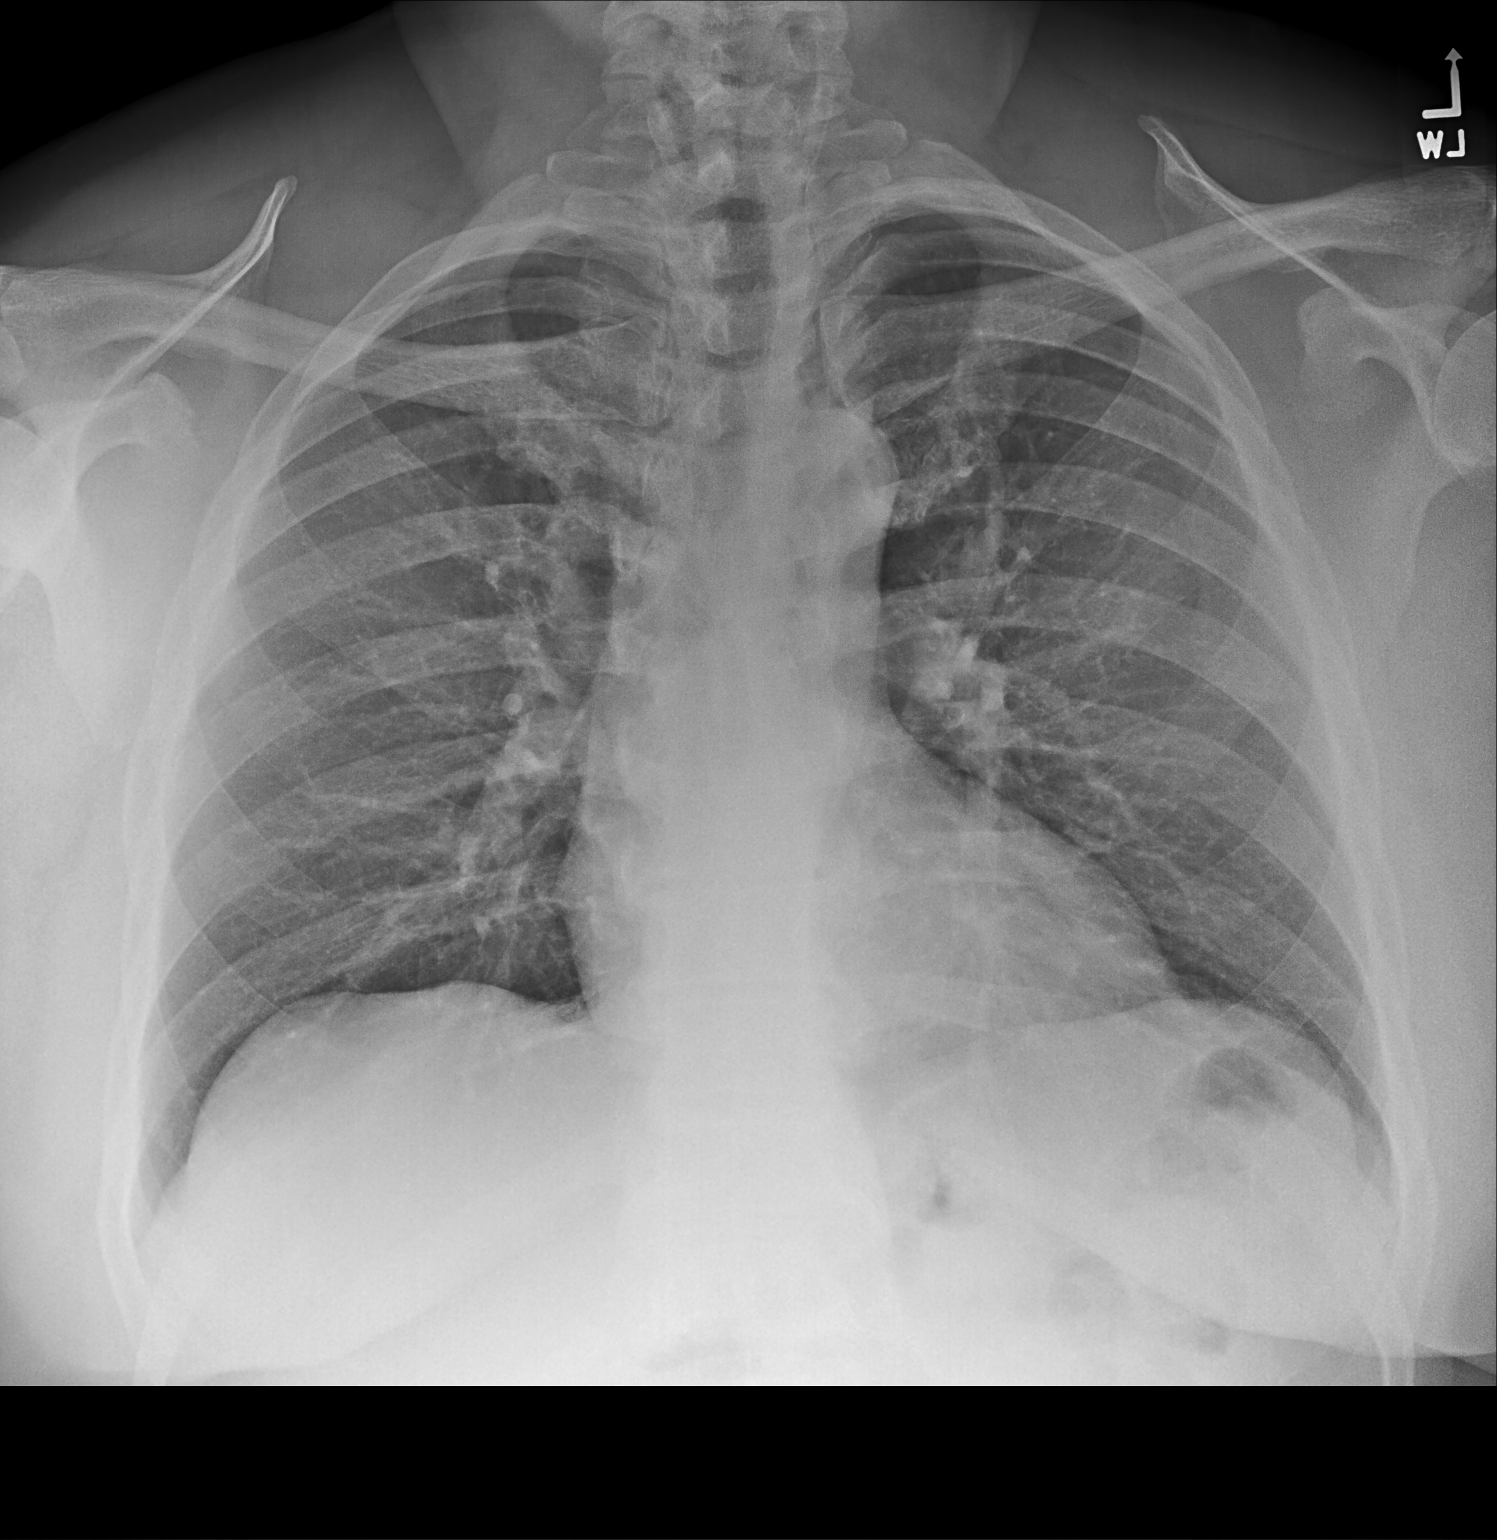

[chest lat]
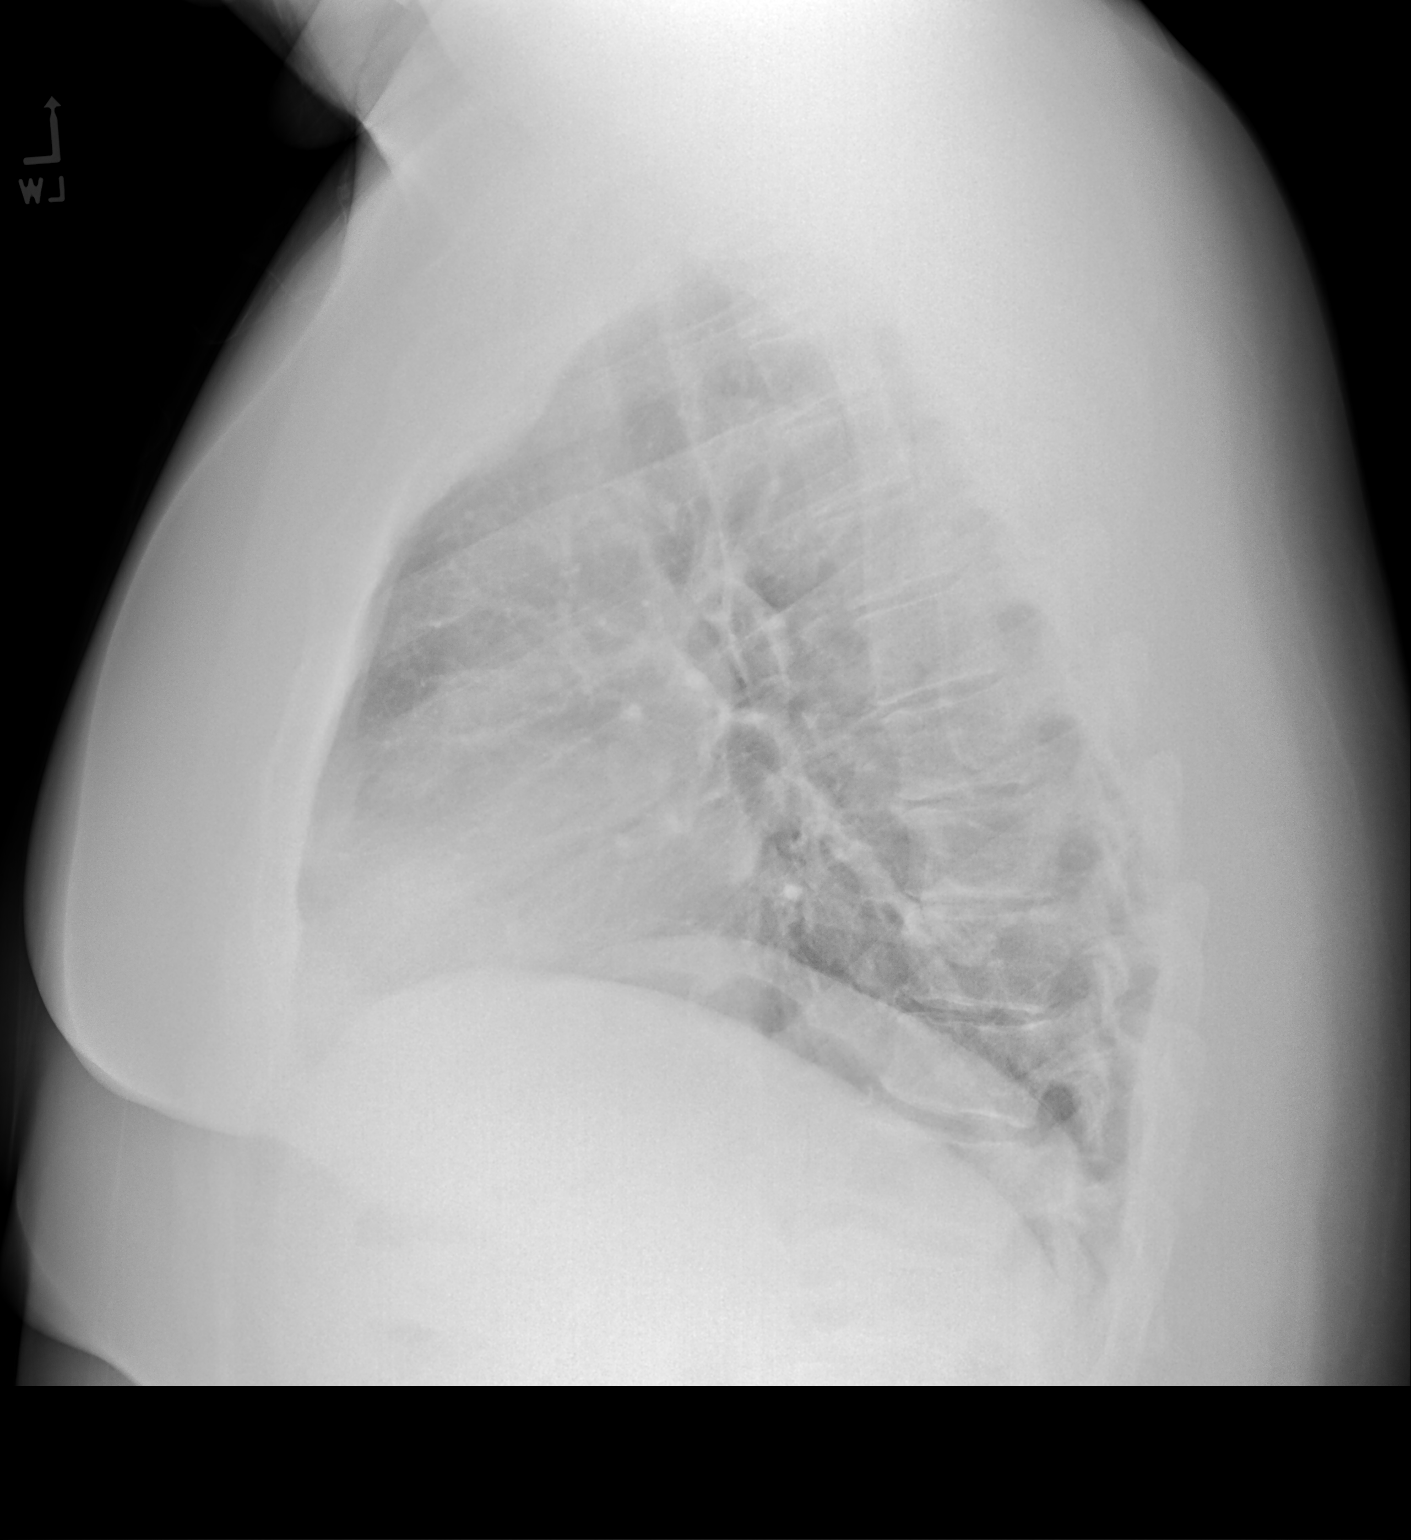

[2 of 2 positions shown; findings below may reference images not displayed]

FINDINGS: The heart size and mediastinal contours are within normal limits.
Both lungs are clear. The visualized skeletal structures are
unremarkable.
IMPRESSION: No active cardiopulmonary disease.

## 2023-04-29 ENCOUNTER — Other Ambulatory Visit: Payer: Self-pay

## 2023-04-29 ENCOUNTER — Other Ambulatory Visit: Payer: Self-pay | Admitting: Internal Medicine

## 2023-04-29 DIAGNOSIS — K219 Gastro-esophageal reflux disease without esophagitis: Secondary | ICD-10-CM

## 2023-04-30 ENCOUNTER — Other Ambulatory Visit: Payer: Self-pay

## 2023-05-01 ENCOUNTER — Other Ambulatory Visit: Payer: Self-pay | Admitting: Internal Medicine

## 2023-05-01 ENCOUNTER — Other Ambulatory Visit: Payer: Self-pay

## 2023-05-01 DIAGNOSIS — E1159 Type 2 diabetes mellitus with other circulatory complications: Secondary | ICD-10-CM

## 2023-05-01 DIAGNOSIS — K219 Gastro-esophageal reflux disease without esophagitis: Secondary | ICD-10-CM

## 2023-05-01 MED ORDER — CARVEDILOL 25 MG PO TABS
25.0000 mg | ORAL_TABLET | Freq: Two times a day (BID) | ORAL | 0 refills | Status: DC
  Filled 2023-05-01: qty 60, 30d supply, fill #0

## 2023-05-01 MED FILL — Pantoprazole Sodium EC Tab 40 MG (Base Equiv): ORAL | 90 days supply | Qty: 90 | Fill #0 | Status: AC

## 2023-05-01 NOTE — Telephone Encounter (Signed)
Requested Prescriptions   Signed Prescriptions Disp Refills   carvedilol (COREG) 25 MG tablet 60 tablet 0    Sig: Take 1 tablet (25 mg total) by mouth 2 (two) times daily. Needs to schedule follow up visit with cardiology for further refills or request from PCP    Authorizing Provider: Debbe Odea    Ordering User: Guerry Minors   Last office visit:  06/04/22  with plan to f/u in prn    Next office visit: none/NO active recall

## 2023-05-04 MED FILL — Fenofibrate Tab 145 MG: ORAL | 90 days supply | Qty: 90 | Fill #2 | Status: AC

## 2023-05-28 ENCOUNTER — Other Ambulatory Visit: Payer: Self-pay

## 2023-05-29 ENCOUNTER — Other Ambulatory Visit: Payer: Self-pay

## 2023-05-29 MED ORDER — SILDENAFIL CITRATE 100 MG PO TABS
100.0000 mg | ORAL_TABLET | ORAL | 11 refills | Status: AC
  Filled 2023-05-29: qty 6, 30d supply, fill #0
  Filled 2023-06-18 – 2023-06-22 (×2): qty 6, 30d supply, fill #1
  Filled 2023-07-06: qty 6, 30d supply, fill #2
  Filled 2023-07-29: qty 6, 30d supply, fill #3
  Filled 2023-08-12 – 2023-08-22 (×2): qty 6, 30d supply, fill #4
  Filled 2023-09-22: qty 6, 30d supply, fill #5
  Filled 2023-10-14 – 2023-10-17 (×3): qty 6, 30d supply, fill #6
  Filled 2023-10-28 – 2023-11-11 (×2): qty 6, 30d supply, fill #7
  Filled 2023-12-02: qty 6, 30d supply, fill #8
  Filled 2023-12-23: qty 6, 30d supply, fill #9
  Filled 2024-01-13 – 2024-01-16 (×2): qty 6, 30d supply, fill #10
  Filled 2024-02-03: qty 10, 10d supply, fill #11
  Filled 2024-03-02: qty 10, 10d supply, fill #12
  Filled 2024-03-23: qty 10, 10d supply, fill #13
  Filled 2024-04-07 – 2024-04-27 (×2): qty 10, 10d supply, fill #14

## 2023-06-04 ENCOUNTER — Other Ambulatory Visit: Payer: Self-pay | Admitting: Internal Medicine

## 2023-06-04 ENCOUNTER — Other Ambulatory Visit: Payer: Self-pay

## 2023-06-05 ENCOUNTER — Other Ambulatory Visit: Payer: Self-pay | Admitting: Internal Medicine

## 2023-06-05 ENCOUNTER — Other Ambulatory Visit: Payer: Self-pay

## 2023-06-05 DIAGNOSIS — E119 Type 2 diabetes mellitus without complications: Secondary | ICD-10-CM

## 2023-06-06 ENCOUNTER — Other Ambulatory Visit: Payer: Self-pay | Admitting: Internal Medicine

## 2023-06-06 ENCOUNTER — Other Ambulatory Visit: Payer: Self-pay

## 2023-06-06 DIAGNOSIS — E1159 Type 2 diabetes mellitus with other circulatory complications: Secondary | ICD-10-CM

## 2023-06-06 MED FILL — Semaglutide Soln Pen-inj 0.25 or 0.5 MG/DOSE (2 MG/3ML): SUBCUTANEOUS | 28 days supply | Qty: 3 | Fill #0 | Status: AC

## 2023-06-07 ENCOUNTER — Other Ambulatory Visit: Payer: Self-pay

## 2023-06-07 DIAGNOSIS — E1159 Type 2 diabetes mellitus with other circulatory complications: Secondary | ICD-10-CM

## 2023-06-07 MED ORDER — CARVEDILOL 25 MG PO TABS
25.0000 mg | ORAL_TABLET | Freq: Two times a day (BID) | ORAL | 0 refills | Status: DC
Start: 2023-06-07 — End: 2023-07-17
  Filled 2023-06-07: qty 60, 30d supply, fill #0

## 2023-06-09 ENCOUNTER — Other Ambulatory Visit: Payer: Self-pay

## 2023-06-09 MED FILL — Carvedilol Tab 25 MG: ORAL | 90 days supply | Qty: 180 | Fill #0 | Status: CN

## 2023-06-18 ENCOUNTER — Other Ambulatory Visit: Payer: Self-pay

## 2023-06-18 MED FILL — Glucose Blood Test Strip: 90 days supply | Qty: 100 | Fill #1 | Status: AC

## 2023-06-19 ENCOUNTER — Other Ambulatory Visit: Payer: Self-pay

## 2023-06-23 DIAGNOSIS — C61 Malignant neoplasm of prostate: Secondary | ICD-10-CM | POA: Diagnosis not present

## 2023-06-24 MED FILL — Lovastatin Tab 40 MG: ORAL | 90 days supply | Qty: 90 | Fill #3 | Status: AC

## 2023-06-30 ENCOUNTER — Ambulatory Visit: Payer: Commercial Managed Care - PPO | Admitting: Internal Medicine

## 2023-06-30 DIAGNOSIS — N5201 Erectile dysfunction due to arterial insufficiency: Secondary | ICD-10-CM | POA: Diagnosis not present

## 2023-06-30 DIAGNOSIS — N393 Stress incontinence (female) (male): Secondary | ICD-10-CM | POA: Diagnosis not present

## 2023-06-30 DIAGNOSIS — C61 Malignant neoplasm of prostate: Secondary | ICD-10-CM | POA: Diagnosis not present

## 2023-07-06 MED FILL — Semaglutide Soln Pen-inj 0.25 or 0.5 MG/DOSE (2 MG/3ML): SUBCUTANEOUS | 28 days supply | Qty: 3 | Fill #1 | Status: AC

## 2023-07-07 ENCOUNTER — Other Ambulatory Visit: Payer: Self-pay

## 2023-07-15 ENCOUNTER — Other Ambulatory Visit: Payer: Self-pay

## 2023-07-16 ENCOUNTER — Other Ambulatory Visit: Payer: Self-pay

## 2023-07-16 MED FILL — Carvedilol Tab 25 MG: ORAL | 90 days supply | Qty: 180 | Fill #0 | Status: AC

## 2023-07-17 ENCOUNTER — Other Ambulatory Visit: Payer: Self-pay

## 2023-07-17 DIAGNOSIS — I152 Hypertension secondary to endocrine disorders: Secondary | ICD-10-CM

## 2023-07-17 MED ORDER — CARVEDILOL 25 MG PO TABS
25.0000 mg | ORAL_TABLET | Freq: Two times a day (BID) | ORAL | 3 refills | Status: DC
  Filled 2023-07-17: qty 60, 30d supply, fill #0

## 2023-07-27 ENCOUNTER — Other Ambulatory Visit: Payer: Self-pay

## 2023-07-27 ENCOUNTER — Encounter: Payer: Self-pay | Admitting: Internal Medicine

## 2023-07-27 ENCOUNTER — Ambulatory Visit: Admitting: Internal Medicine

## 2023-07-27 VITALS — BP 102/65 | HR 76 | Ht 75.0 in | Wt 291.0 lb

## 2023-07-27 DIAGNOSIS — E119 Type 2 diabetes mellitus without complications: Secondary | ICD-10-CM | POA: Diagnosis not present

## 2023-07-27 DIAGNOSIS — I10 Essential (primary) hypertension: Secondary | ICD-10-CM | POA: Diagnosis not present

## 2023-07-27 DIAGNOSIS — E1159 Type 2 diabetes mellitus with other circulatory complications: Secondary | ICD-10-CM

## 2023-07-27 DIAGNOSIS — I152 Hypertension secondary to endocrine disorders: Secondary | ICD-10-CM

## 2023-07-27 DIAGNOSIS — N060 Isolated proteinuria with minor glomerular abnormality: Secondary | ICD-10-CM

## 2023-07-27 DIAGNOSIS — Z6841 Body Mass Index (BMI) 40.0 and over, adult: Secondary | ICD-10-CM | POA: Diagnosis not present

## 2023-07-27 DIAGNOSIS — G4733 Obstructive sleep apnea (adult) (pediatric): Secondary | ICD-10-CM

## 2023-07-27 DIAGNOSIS — E782 Mixed hyperlipidemia: Secondary | ICD-10-CM

## 2023-07-27 DIAGNOSIS — N183 Chronic kidney disease, stage 3 unspecified: Secondary | ICD-10-CM

## 2023-07-27 DIAGNOSIS — Z7985 Long-term (current) use of injectable non-insulin antidiabetic drugs: Secondary | ICD-10-CM | POA: Diagnosis not present

## 2023-07-27 DIAGNOSIS — Z125 Encounter for screening for malignant neoplasm of prostate: Secondary | ICD-10-CM

## 2023-07-27 DIAGNOSIS — Z0000 Encounter for general adult medical examination without abnormal findings: Secondary | ICD-10-CM

## 2023-07-27 LAB — CBC WITH DIFFERENTIAL/PLATELET
Basophils Absolute: 0 10*3/uL (ref 0.0–0.1)
Basophils Relative: 0.5 % (ref 0.0–3.0)
Eosinophils Absolute: 0.2 10*3/uL (ref 0.0–0.7)
Eosinophils Relative: 3 % (ref 0.0–5.0)
HCT: 40.5 % (ref 39.0–52.0)
Hemoglobin: 13.1 g/dL (ref 13.0–17.0)
Lymphocytes Relative: 39.1 % (ref 12.0–46.0)
Lymphs Abs: 2 10*3/uL (ref 0.7–4.0)
MCHC: 32.3 g/dL (ref 30.0–36.0)
MCV: 74.4 fl — ABNORMAL LOW (ref 78.0–100.0)
Monocytes Absolute: 0.5 10*3/uL (ref 0.1–1.0)
Monocytes Relative: 8.9 % (ref 3.0–12.0)
Neutro Abs: 2.4 10*3/uL (ref 1.4–7.7)
Neutrophils Relative %: 48.5 % (ref 43.0–77.0)
Platelets: 254 10*3/uL (ref 150.0–400.0)
RBC: 5.45 Mil/uL (ref 4.22–5.81)
RDW: 15.5 % (ref 11.5–15.5)
WBC: 5 10*3/uL (ref 4.0–10.5)

## 2023-07-27 LAB — LIPID PANEL
Cholesterol: 118 mg/dL (ref 0–200)
HDL: 41.1 mg/dL (ref >39.00)
LDL Cholesterol: 63 mg/dL (ref 0–99)
NonHDL: 76.6
Total CHOL/HDL Ratio: 3
Triglycerides: 68 mg/dL (ref 0.0–149.0)
VLDL: 13.6 mg/dL (ref 0.0–40.0)

## 2023-07-27 LAB — COMPREHENSIVE METABOLIC PANEL WITH GFR
ALT: 20 U/L (ref 0–53)
AST: 18 U/L (ref 0–37)
Albumin: 4.2 g/dL (ref 3.5–5.2)
Alkaline Phosphatase: 64 U/L (ref 39–117)
BUN: 19 mg/dL (ref 6–23)
CO2: 26 meq/L (ref 19–32)
Calcium: 9.4 mg/dL (ref 8.4–10.5)
Chloride: 106 meq/L (ref 96–112)
Creatinine, Ser: 1.35 mg/dL (ref 0.40–1.50)
GFR: 57.01 mL/min — ABNORMAL LOW (ref >60.00)
Glucose, Bld: 114 mg/dL — ABNORMAL HIGH (ref 70–99)
Potassium: 3.9 meq/L (ref 3.5–5.1)
Sodium: 139 meq/L (ref 135–145)
Total Bilirubin: 0.5 mg/dL (ref 0.2–1.2)
Total Protein: 7.5 g/dL (ref 6.0–8.3)

## 2023-07-27 LAB — LDL CHOLESTEROL, DIRECT: Direct LDL: 59 mg/dL

## 2023-07-27 LAB — HEMOGLOBIN A1C: Hgb A1c MFr Bld: 6.1 % (ref 4.6–6.5)

## 2023-07-27 MED ORDER — SEMAGLUTIDE(0.25 OR 0.5MG/DOS) 2 MG/3ML ~~LOC~~ SOPN
0.5000 mg | PEN_INJECTOR | SUBCUTANEOUS | 2 refills | Status: DC
  Filled 2023-07-27 – 2023-08-04 (×3): qty 3, 28d supply, fill #0
  Filled 2023-08-27: qty 3, 28d supply, fill #1
  Filled 2023-09-24: qty 3, 28d supply, fill #2

## 2023-07-27 MED ORDER — IRBESARTAN 300 MG PO TABS
300.0000 mg | ORAL_TABLET | Freq: Every day | ORAL | 11 refills | Status: AC
  Filled 2023-07-27: qty 30, 30d supply, fill #0
  Filled 2023-08-12 – 2023-08-22 (×2): qty 30, 30d supply, fill #1
  Filled 2023-09-22: qty 30, 30d supply, fill #2
  Filled 2023-10-28: qty 30, 30d supply, fill #3
  Filled 2023-12-02: qty 30, 30d supply, fill #4
  Filled 2023-12-30: qty 30, 30d supply, fill #5
  Filled 2024-02-03: qty 30, 30d supply, fill #6
  Filled 2024-03-02: qty 30, 30d supply, fill #7
  Filled 2024-03-31: qty 30, 30d supply, fill #8
  Filled 2024-04-27: qty 30, 30d supply, fill #9

## 2023-07-27 MED ORDER — CARVEDILOL 12.5 MG PO TABS
12.5000 mg | ORAL_TABLET | Freq: Two times a day (BID) | ORAL | 3 refills | Status: DC
Start: 2023-07-27 — End: 2024-01-21
  Filled 2023-07-27: qty 60, 30d supply, fill #0
  Filled 2023-10-14: qty 60, 30d supply, fill #1
  Filled 2023-11-11: qty 60, 30d supply, fill #2
  Filled 2023-12-23: qty 60, 30d supply, fill #3

## 2023-07-27 MED ORDER — LOVASTATIN 40 MG PO TABS
40.0000 mg | ORAL_TABLET | Freq: Every day | ORAL | 3 refills | Status: AC
  Filled 2023-07-27 – 2023-09-22 (×2): qty 90, 90d supply, fill #0
  Filled 2023-12-30: qty 90, 90d supply, fill #1
  Filled 2024-03-29: qty 90, 90d supply, fill #2

## 2023-07-27 NOTE — Patient Instructions (Signed)
 I am reducing your dose of carvedilol  to 12.5 mg twice daily (1/2 of your current dose)  Continue irbesartan  and nifedipine  at current doses   If BP starts to increase to > 130/80, let me know  I am increasing the ozempic  dose to 0.5 mg weekly

## 2023-07-27 NOTE — Assessment & Plan Note (Signed)
 Averaging 5 to 6  hours per night.

## 2023-07-27 NOTE — Progress Notes (Deleted)
 Subjective:  Patient ID: Nicholas Carr, male    DOB: October 21, 1960  Age: 63 y.o. MRN: [id number]  CC: The primary encounter diagnosis was Essential hypertension. Diagnoses of Type 2 diabetes mellitus without complications (HCC), Controlled type 2 diabetes mellitus without complication, without long-term current use of insulin  (HCC), Mixed hyperlipidemia, Morbid obesity with BMI of 40.0-44.9, adult [facility]), Prostate cancer screening, Obstructive sleep apnea, Hypertension associated with diabetes (HCC), Stage 3 chronic kidney disease, unspecified whether stage 3a or 3b CKD (HCC), and Isolated proteinuria with minor glomerular abnormality were also pertinent to this visit.   HPI Nicholas Carr presents for  Chief Complaint  Patient presents with   Medical Management of Chronic Issues    follow up on type 2 DM/obesity/HTN:  1) T2M/obesity:   patient was last seen in October and offered Ozempic  for management of T2DM/Obesity.  He has been taking the .25 mg dose and has lost 11 lbs since October . Tolerating the dose without nausea  ABD PAIN OR CONSTIPATION .  2) HTN:  his wife has been checking BP at home and readings have been lower than today frequently dropping to  102/65  on current regimen of carvedilol  25 mg bid Procardia  XL 120 mg daily and irbesartan  300 mg daily .    3( history of trench foot in his 20's 1983  took years  to resolve   4) prostate CA:  s/p radical prostatectomy over 5 yrs ago,  continues observation  by [employer] [facility] for rising PSA, most recently  0.12  in April 2025  . 6 month follow up planned   Outpatient Medications Prior to Visit  Medication Sig Dispense Refill   Accu-Chek FastClix Lancets MISC CHECK BLOOD SUGAR TWICE A DAY 204 each 1   fenofibrate  (TRICOR ) 145 MG tablet TAKE 1 TABLET (145 MG TOTAL) BY MOUTH DAILY. 90 tablet 3   glucose blood (FREESTYLE LITE) test strip Use as instructed 100 each 12   NIFEdipine  (PROCARDIA  XL) 60 MG 24 hr tablet Take 2 tablets (120 mg  total) by mouth daily. 180 tablet 3   pantoprazole  (PROTONIX ) 40 MG tablet Take 1 tablet (40 mg total) by mouth daily. 30 minutes before food 90 tablet 3   sildenafil  (VIAGRA ) 100 MG tablet Take 1 tablet (100 mg total) by mouth as directed as needed 10 tablet 11   traMADol  (ULTRAM ) 50 MG tablet Take 1-2 tablets (50-100 mg total) by mouth every 12 (twelve) hours as needed. MAX DOSE 100 MG DAILY 60 tablet 0   carvedilol  (COREG ) 25 MG tablet Take 1 tablet (25 mg total) by mouth 2 (two) times daily. Needs to schedule follow up visit with cardiology for further refills or request from PCP 60 tablet 3   irbesartan  (AVAPRO ) 300 MG tablet TAKE 1 TABLET BY MOUTH DAILY. 90 tablet 3   lovastatin  (MEVACOR ) 40 MG tablet Take 1 tablet (40 mg total) by mouth at bedtime. 30 tablet 11   Semaglutide ,0.25 or 0.5MG /DOS, (OZEMPIC , 0.25 OR 0.5 MG/DOSE,) 2 MG/3ML SOPN Inject 0.25 mg into the skin once a week. 3 mL 1   metFORMIN  (GLUCOPHAGE ) 500 MG tablet Take 1 tablet (500 mg total) by mouth daily as needed. (Patient not taking: Reported on 07/27/2023) 90 tablet 3   CALCIUM POLYCARBOPHIL PO Take by mouth.     carvedilol  (COREG ) 25 MG tablet Take 1 tablet (25 mg total) by mouth 2 (two) times daily. 180 tablet 2   Facility-Administered Medications Prior to Visit  Medication Dose  Route Frequency Provider Last Rate Last Admin   0.9 %  sodium chloride  infusion  500 mL Intravenous Once Mansouraty, Gabriel Jr., MD        Review of Systems;  Patient denies headache, fevers, malaise, unintentional weight loss, skin rash, eye pain, sinus congestion and sinus pain, sore throat, dysphagia,  hemoptysis , cough, dyspnea, wheezing, chest pain, palpitations, orthopnea, edema, abdominal pain, nausea, melena, diarrhea, constipation, flank pain, dysuria, hematuria, urinary  Frequency, nocturia, numbness, tingling, seizures,  Focal weakness, Loss of consciousness,  Tremor, insomnia, depression, anxiety, and suicidal ideation.       Objective:  BP 102/65 (Cuff Size: Large)   Pulse 76   Ht 6' 3 (1.905 m)   Wt 291 lb (132 kg)   SpO2 97%   BMI 36.37 kg/m   BP Readings from Last 3 Encounters:  07/27/23 102/65  12/30/22 130/80  12/02/22 128/80    Wt Readings from Last 3 Encounters:  07/27/23 291 lb (132 kg)  12/30/22 (!) 302 lb 9.6 oz (137.3 kg)  12/02/22 299 lb 6.4 oz (135.8 kg)    Physical Exam  Lab Results  Component Value Date   HGBA1C 6.1 07/27/2023   HGBA1C 6.9 (H) 12/30/2022   HGBA1C 6.6 (H) 03/14/2022    Lab Results  Component Value Date   CREATININE 1.35 07/27/2023   CREATININE 1.12 12/30/2022   CREATININE 1.44 03/14/2022    Lab Results  Component Value Date   WBC 5.0 07/27/2023   HGB 13.1 07/27/2023   HCT 40.5 07/27/2023   PLT 254.0 07/27/2023   GLUCOSE 114 (H) 07/27/2023   CHOL 118 07/27/2023   TRIG 68.0 07/27/2023   HDL 41.10 07/27/2023   LDLDIRECT 59.0 07/27/2023   LDLCALC 63 07/27/2023   ALT 20 07/27/2023   AST 18 07/27/2023   NA 139 07/27/2023   K 3.9 07/27/2023   CL 106 07/27/2023   CREATININE 1.35 07/27/2023   BUN 19 07/27/2023   CO2 26 07/27/2023   TSH 3.26 12/30/2022   PSA 6.55 (H) 09/11/2017   HGBA1C 6.1 07/27/2023   MICROALBUR 1.1 12/30/2022     Assessment & Plan:  .Essential hypertension -     Comprehensive metabolic panel with GFR  Type 2 diabetes mellitus without complications (HCC)  Controlled type 2 diabetes mellitus without complication, without long-term current use of insulin  (HCC) Assessment & Plan: Currently well-controlled on metformin   and Ozempic  started after last visit.  Patient is reminded to schedule an annual eye exam and foot exam is normal today. Patient has no microalbuminuria. Patient is tolerating statin therapy for CAD risk reduction and on ACE/ARB for renal protection and hypertension  .  He has resumed aspirin twice weekly given his increased risk for CAD  Lab Results  Component Value Date   HGBA1C 6.1 07/27/2023    Lab Results  Component Value Date   LABMICR Comment 06/15/2018   LABMICR 13.4 06/15/2018   MICROALBUR 1.1 12/30/2022   MICROALBUR 6.1 10/08/2021        Lab Results  Component Value Date   CREATININE 1.35 07/27/2023      Orders: -     Comprehensive metabolic panel with GFR -     Hemoglobin A1c  Mixed hyperlipidemia -     Lipid panel -     LDL cholesterol, direct  Morbid obesity with BMI of 40.0-44.9, adult (HCC) -     CBC with Differential/Platelet  Prostate cancer screening  Obstructive sleep apnea Assessment & Plan: Averaging 5 to  6  hours per night.    Hypertension associated with diabetes [facility]) Assessment & Plan: Well controlled on current regimen of carvedilol , irbesartan , and nifedipine . Renal function stable, no changes today.   Lab Results  Component Value Date   CREATININE 1.35 07/27/2023   Lab Results  Component Value Date   NA 139 07/27/2023   K 3.9 07/27/2023   CL 106 07/27/2023   CO2 26 07/27/2023     Orders: -     Carvedilol ; Take 1 tablet (12.5 mg total) by mouth 2 (two) times daily.  Dispense: 60 tablet; Refill: 3  Stage 3 chronic kidney disease, unspecified whether stage 3a or 3b CKD (HCC) Assessment & Plan: Cr is stable.   He is avoiding NSAIDS and taking an ARB  Lab Results  Component Value Date   CREATININE 1.35 07/27/2023      Isolated proteinuria with minor glomerular abnormality Assessment & Plan: Resolved with ARB    Other orders -     Irbesartan ; Take 1 tablet (300 mg total) by mouth daily.  Dispense: 30 tablet; Refill: 11 -     Lovastatin ; Take 1 tablet (40 mg total) by mouth at bedtime.  Dispense: 90 tablet; Refill: 3 -     Semaglutide (0.25 or 0.5MG /DOS); Inject 0.5 mg into the skin once a week.  Dispense: 3 mL; Refill: 2     I spent 34 minutes on the day of this face to face encounter reviewing patient's  most recent visit with pulmonology, prior relevant surgical and non surgical procedures, recent  labs  and imaging studies, counseling on weight management,  reviewing the assessment and plan with patient, and post visit ordering and reviewing of  diagnostics and therapeutics with patient  .   Follow-up: Return in about 6 months (around 01/27/2024) for follow up diabetes.   Nicholas LITTIE Kettering, MD

## 2023-07-28 ENCOUNTER — Ambulatory Visit: Payer: Commercial Managed Care - PPO | Admitting: Internal Medicine

## 2023-07-29 ENCOUNTER — Other Ambulatory Visit: Payer: Self-pay

## 2023-07-29 ENCOUNTER — Other Ambulatory Visit: Payer: Self-pay | Admitting: Internal Medicine

## 2023-07-29 DIAGNOSIS — M545 Low back pain, unspecified: Secondary | ICD-10-CM

## 2023-07-29 DIAGNOSIS — G8929 Other chronic pain: Secondary | ICD-10-CM

## 2023-07-29 MED FILL — Pantoprazole Sodium EC Tab 40 MG (Base Equiv): ORAL | 90 days supply | Qty: 90 | Fill #1 | Status: AC

## 2023-07-30 ENCOUNTER — Encounter: Payer: Self-pay | Admitting: Internal Medicine

## 2023-07-30 NOTE — Assessment & Plan Note (Signed)
 Resolved with ARB

## 2023-07-30 NOTE — Assessment & Plan Note (Signed)
 Well controlled on current regimen of carvedilol , irbesartan , and nifedipine . Renal function stable, no changes today.   Lab Results  Component Value Date   CREATININE 1.35 07/27/2023   Lab Results  Component Value Date   NA 139 07/27/2023   K 3.9 07/27/2023   CL 106 07/27/2023   CO2 26 07/27/2023

## 2023-07-30 NOTE — Assessment & Plan Note (Signed)
 Cr is stable.   He is avoiding NSAIDS and taking an ARB  Lab Results  Component Value Date   CREATININE 1.35 07/27/2023

## 2023-07-30 NOTE — Assessment & Plan Note (Signed)
 Currently well-controlled on metformin   and Ozempic  started after last visit.  Patient is reminded to schedule an annual eye exam and foot exam is normal today. Patient has no microalbuminuria. Patient is tolerating statin therapy for CAD risk reduction and on ACE/ARB for renal protection and hypertension  .  He has resumed aspirin twice weekly given his increased risk for CAD  Lab Results  Component Value Date   HGBA1C 6.1 07/27/2023   Lab Results  Component Value Date   LABMICR Comment 06/15/2018   LABMICR 13.4 06/15/2018   MICROALBUR 1.1 12/30/2022   MICROALBUR 6.1 10/08/2021        Lab Results  Component Value Date   CREATININE 1.35 07/27/2023

## 2023-07-31 ENCOUNTER — Other Ambulatory Visit: Payer: Self-pay

## 2023-07-31 ENCOUNTER — Other Ambulatory Visit: Payer: Self-pay | Admitting: Internal Medicine

## 2023-07-31 DIAGNOSIS — M545 Low back pain, unspecified: Secondary | ICD-10-CM

## 2023-07-31 DIAGNOSIS — G8929 Other chronic pain: Secondary | ICD-10-CM

## 2023-08-01 ENCOUNTER — Other Ambulatory Visit (HOSPITAL_COMMUNITY): Payer: Self-pay

## 2023-08-01 ENCOUNTER — Other Ambulatory Visit: Payer: Self-pay

## 2023-08-01 MED FILL — Tramadol HCl Tab 50 MG: ORAL | 15 days supply | Qty: 60 | Fill #0 | Status: AC

## 2023-08-02 ENCOUNTER — Telehealth: Payer: Self-pay | Admitting: Pharmacy Technician

## 2023-08-02 ENCOUNTER — Other Ambulatory Visit (HOSPITAL_COMMUNITY): Payer: Self-pay

## 2023-08-02 NOTE — Telephone Encounter (Signed)
 Pharmacy Patient Advocate Encounter   Received notification from CoverMyMeds that prior authorization for ozempic  is required/requested.   Insurance verification completed.   The patient is insured through [facility] .   Per test claim: PA required; PA submitted to above mentioned insurance via CoverMyMeds Key/confirmation #/EOC [id number] Status is pending

## 2023-08-02 NOTE — Telephone Encounter (Signed)
 Pharmacy Patient Advocate Encounter  Received notification from [employer] that Prior Authorization for ozempic  has been APPROVED from 08/02/23 to 08/01/24. Ran test claim, Copay is $24.99- one month. This test claim was processed through [facility]- copay amounts may vary at other pharmacies due to pharmacy/plan contracts, or as the patient moves through the different stages of their insurance plan.   PA #/Case ID/Reference #: [id number]

## 2023-08-04 ENCOUNTER — Other Ambulatory Visit: Payer: Self-pay

## 2023-08-05 MED FILL — Fenofibrate Tab 145 MG: ORAL | 90 days supply | Qty: 90 | Fill #3 | Status: CN

## 2023-08-07 ENCOUNTER — Other Ambulatory Visit: Payer: Self-pay

## 2023-08-12 MED FILL — Fenofibrate Tab 145 MG: ORAL | 90 days supply | Qty: 90 | Fill #3 | Status: AC

## 2023-08-13 ENCOUNTER — Other Ambulatory Visit: Payer: Self-pay

## 2023-08-14 ENCOUNTER — Other Ambulatory Visit: Payer: Self-pay

## 2023-08-17 ENCOUNTER — Other Ambulatory Visit: Payer: Self-pay

## 2023-08-28 ENCOUNTER — Ambulatory Visit: Admitting: Internal Medicine

## 2023-09-22 MED FILL — Glucose Blood Test Strip: 90 days supply | Qty: 100 | Fill #2 | Status: AC

## 2023-09-22 MED FILL — Tramadol HCl Tab 50 MG: ORAL | 15 days supply | Qty: 60 | Fill #1 | Status: AC

## 2023-09-23 ENCOUNTER — Other Ambulatory Visit: Payer: Self-pay

## 2023-09-24 ENCOUNTER — Other Ambulatory Visit: Payer: Self-pay

## 2023-09-25 ENCOUNTER — Other Ambulatory Visit: Payer: Self-pay

## 2023-10-15 ENCOUNTER — Other Ambulatory Visit: Payer: Self-pay

## 2023-10-16 ENCOUNTER — Other Ambulatory Visit: Payer: Self-pay

## 2023-10-17 ENCOUNTER — Other Ambulatory Visit: Payer: Self-pay

## 2023-10-28 ENCOUNTER — Other Ambulatory Visit: Payer: Self-pay

## 2023-10-28 MED FILL — Pantoprazole Sodium EC Tab 40 MG (Base Equiv): ORAL | 90 days supply | Qty: 90 | Fill #2 | Status: AC

## 2023-11-01 NOTE — Assessment & Plan Note (Signed)

## 2023-11-01 NOTE — Progress Notes (Addendum)
 Patient ID: Nicholas Carr, male    DOB: 1960-08-03  Age: 63 y.o. MRN: [id number]  The patient is here for annual preventive examination and management of other chronic and acute problems.   The risk factors are reflected in the social history.   The roster of all physicians providing medical care to patient - is listed in the Snapshot section of the chart.   Activities of daily living:  The patient is 100% independent in all ADLs: dressing, toileting, feeding as well as independent mobility   Home safety : The patient has smoke detectors in the home. They wear seatbelts.  There are no unsecured firearms at home. There is no violence in the home.    There is no risks for hepatitis, STDs or HIV. There is no   history of blood transfusion. They have no travel history to infectious disease endemic areas of the world.   The patient has seen their dentist in the last six month. They have seen their eye doctor in the last year. The patinet  denies slight hearing difficulty with regard to whispered voices and some television programs.  They have deferred audiologic testing in the last year.  They do not  have excessive sun exposure. Discussed the need for sun protection: hats, long sleeves and use of sunscreen if there is significant sun exposure.    Diet: the importance of a healthy diet is discussed. They do have a healthy diet.   The benefits of regular aerobic exercise were discussed. The patient  exercises  3 to 5 days per week  for  60 minutes.    Depression screen: there are no signs or vegative symptoms of depression- irritability, change in appetite, anhedonia, sadness/tearfullness.   The following portions of the patient's history were reviewed and updated as appropriate: allergies, current medications, past family history, past medical history,  past surgical history, past social history  and problem list.   Visual acuity was not assessed per patient preference since the patient has regular  follow up with an  ophthalmologist. Hearing and body mass index were assessed and reviewed.    During the course of the visit the patient was educated and counseled about appropriate screening and preventive services including : fall prevention , diabetes screening, nutrition counseling, colorectal cancer screening, and recommended immunizations.    Chief Complaint:    follow up on type 2 DM/obesity/HTN:  1) T2M/obesity:   patient was last seen in October and offered Ozempic  for management of T2DM/Obesity.  He has been taking the .25 mg dose and has lost 11 lbs since October . Tolerating the dose without nausea  ABD PAIN OR CONSTIPATION .  2) HTN:  his wife has been checking BP at home and readings have been lower than today frequently dropping to  102/65  on current regimen of carvedilol  25 mg bid Procardia  XL 120 mg daily and irbesartan  300 mg daily .    3( history of trench foot in his 20's 1983  took years  to resolve   4) prostate CA:  s/p radical prostatectomy over 5 yrs ago,  continues observation  by [employer] for rising PSA, most recently  0.12  in April 2025  . 6 month follow up planned   Review of Symptoms  Patient denies headache, fevers, malaise, unintentional weight loss, skin rash, eye pain, sinus congestion and sinus pain, sore throat, dysphagia,  hemoptysis , cough, dyspnea, wheezing, chest pain, palpitations, orthopnea, edema, abdominal pain, nausea, melena, diarrhea,  constipation, flank pain, dysuria, hematuria, urinary  Frequency, nocturia, numbness, tingling, seizures,  Focal weakness, Loss of consciousness,  Tremor, insomnia, depression, anxiety, and suicidal ideation.    Physical Exam:  BP 102/65 (Cuff Size: Large)   Pulse 76   Ht 6' 3 (1.905 m)   Wt 291 lb (132 kg)   SpO2 97%   BMI 36.37 kg/m    Physical Exam Vitals reviewed.  Constitutional:      General: He is not in acute distress.    Appearance: Normal appearance. He is normal weight. He is not  ill-appearing, toxic-appearing or diaphoretic.  HENT:     Head: Normocephalic and atraumatic.     Right Ear: Tympanic membrane, ear canal and external ear normal. There is no impacted cerumen.     Left Ear: Tympanic membrane, ear canal and external ear normal. There is no impacted cerumen.     Nose: Nose normal.     Mouth/Throat:     Mouth: Mucous membranes are moist.     Pharynx: Oropharynx is clear.  Eyes:     General: No scleral icterus.       Right eye: No discharge.        Left eye: No discharge.     Conjunctiva/sclera: Conjunctivae normal.  Neck:     Thyroid : No thyromegaly.     Vascular: No carotid bruit or JVD.  Cardiovascular:     Rate and Rhythm: Normal rate and regular rhythm.     Heart sounds: Normal heart sounds.  Pulmonary:     Effort: Pulmonary effort is normal. No respiratory distress.     Breath sounds: Normal breath sounds.  Abdominal:     General: Bowel sounds are normal.     Palpations: Abdomen is soft. There is no mass.     Tenderness: There is no abdominal tenderness. There is no guarding or rebound.  Musculoskeletal:        General: Normal range of motion.     Cervical back: Normal range of motion and neck supple.  Lymphadenopathy:     Cervical: No cervical adenopathy.  Skin:    General: Skin is warm and dry.     Findings: Bruising present.  Neurological:     General: No focal deficit present.     Mental Status: He is alert and oriented to person, place, and time. Mental status is at baseline.  Psychiatric:        Mood and Affect: Mood normal.        Behavior: Behavior normal.        Thought Content: Thought content normal.        Judgment: Judgment normal.     Assessment and Plan: Essential hypertension -     Comprehensive metabolic panel with GFR  Type 2 diabetes mellitus without complications (HCC)  Controlled type 2 diabetes mellitus without complication, without long-term current use of insulin  (HCC) Assessment & Plan: Currently  well-controlled on metformin   and Ozempic  started after last visit.  Patient is reminded to schedule an annual eye exam and foot exam is normal today. Patient has no microalbuminuria. Patient is tolerating statin therapy for CAD risk reduction and on ACE/ARB for renal protection and hypertension  .  He has resumed aspirin twice weekly given his increased risk for CAD  Lab Results  Component Value Date   HGBA1C 6.1 07/27/2023   Lab Results  Component Value Date   LABMICR Comment 06/15/2018   LABMICR 13.4 06/15/2018   MICROALBUR 1.1 12/30/2022  MICROALBUR 6.1 10/08/2021        Lab Results  Component Value Date   CREATININE 1.35 07/27/2023      Orders: -     Comprehensive metabolic panel with GFR -     Hemoglobin A1c  Mixed hyperlipidemia -     Lipid panel -     LDL cholesterol, direct  Morbid obesity with BMI of 40.0-44.9, adult (HCC) -     CBC with Differential/Platelet  Prostate cancer screening  Obstructive sleep apnea Assessment & Plan: Averaging 5 to 6  hours per night.    Hypertension associated with diabetes [facility]) Assessment & Plan: Well controlled on current regimen of carvedilol , irbesartan , and nifedipine . Renal function stable, no changes today.   Lab Results  Component Value Date   CREATININE 1.35 07/27/2023   Lab Results  Component Value Date   NA 139 07/27/2023   K 3.9 07/27/2023   CL 106 07/27/2023   CO2 26 07/27/2023     Orders: -     Carvedilol ; Take 1 tablet (12.5 mg total) by mouth 2 (two) times daily.  Dispense: 60 tablet; Refill: 3  Stage 3 chronic kidney disease, unspecified whether stage 3a or 3b CKD (HCC) Assessment & Plan: Cr is stable.   He is avoiding NSAIDS and taking an ARB  Lab Results  Component Value Date   CREATININE 1.35 07/27/2023      Isolated proteinuria with minor glomerular abnormality Assessment & Plan: Resolved with ARB    Encounter for preventive health examination Assessment & Plan: age  appropriate education and counseling updated, referrals for preventative services and immunizations addressed, dietary and smoking counseling addressed, most recent labs reviewed.  I have personally reviewed and have noted:   1) the patient's medical and social history 2) The pt's use of alcohol, tobacco, and illicit drugs 3) The patient's current medications and supplements 4) Functional ability including ADL's, fall risk, home safety risk, hearing and visual impairment 5) Diet and physical activities 6) Evidence for depression or mood disorder 7) The patient's height, weight, and BMI have been recorded in the chart  I have made referrals, and provided counseling and education based on review of the above    Other orders -     Irbesartan ; Take 1 tablet (300 mg total) by mouth daily.  Dispense: 30 tablet; Refill: 11 -     Lovastatin ; Take 1 tablet (40 mg total) by mouth at bedtime.  Dispense: 90 tablet; Refill: 3 -     Semaglutide (0.25 or 0.5MG /DOS); Inject 0.5 mg into the skin once a week.  Dispense: 3 mL; Refill: 2    Return in about 6 months (around 01/27/2024) for follow up diabetes.  Verneita LITTIE Kettering, MD

## 2023-11-01 NOTE — Addendum Note (Signed)
 Addended by: MARYLYNN VERNEITA CROME on: 11/01/2023 05:47 PM   Modules accepted: Level of Service

## 2023-11-11 ENCOUNTER — Other Ambulatory Visit: Payer: Self-pay | Admitting: Internal Medicine

## 2023-11-11 ENCOUNTER — Other Ambulatory Visit: Payer: Self-pay | Admitting: Cardiology

## 2023-11-12 ENCOUNTER — Other Ambulatory Visit: Payer: Self-pay

## 2023-11-13 ENCOUNTER — Other Ambulatory Visit: Payer: Self-pay | Admitting: Cardiology

## 2023-11-13 ENCOUNTER — Other Ambulatory Visit: Payer: Self-pay

## 2023-11-13 MED ORDER — OZEMPIC (0.25 OR 0.5 MG/DOSE) 2 MG/3ML ~~LOC~~ SOPN
0.5000 mg | PEN_INJECTOR | SUBCUTANEOUS | 2 refills | Status: DC
  Filled 2023-11-13: qty 3, 28d supply, fill #0
  Filled 2023-12-23: qty 3, 28d supply, fill #1
  Filled 2024-01-21: qty 3, 28d supply, fill #2

## 2023-11-13 MED ORDER — FENOFIBRATE 145 MG PO TABS
145.0000 mg | ORAL_TABLET | Freq: Every day | ORAL | 3 refills | Status: AC
  Filled 2023-11-13: qty 90, 90d supply, fill #0
  Filled 2024-02-09: qty 90, 90d supply, fill #1

## 2023-11-14 ENCOUNTER — Other Ambulatory Visit: Payer: Self-pay

## 2023-11-15 ENCOUNTER — Other Ambulatory Visit: Payer: Self-pay

## 2023-11-15 ENCOUNTER — Encounter: Admitting: Internal Medicine

## 2023-11-16 ENCOUNTER — Other Ambulatory Visit: Payer: Self-pay | Admitting: Internal Medicine

## 2023-11-16 ENCOUNTER — Other Ambulatory Visit: Payer: Self-pay

## 2023-11-16 MED FILL — Nifedipine Tab ER 24HR Osmotic Release 60 MG: ORAL | 30 days supply | Qty: 60 | Fill #0 | Status: AC

## 2023-12-02 ENCOUNTER — Other Ambulatory Visit: Payer: Self-pay

## 2023-12-07 ENCOUNTER — Ambulatory Visit: Admitting: Sleep Medicine

## 2023-12-07 ENCOUNTER — Encounter: Payer: Self-pay | Admitting: Sleep Medicine

## 2023-12-07 VITALS — BP 130/90 | HR 85 | Ht 75.0 in | Wt 285.0 lb

## 2023-12-07 DIAGNOSIS — F5112 Insufficient sleep syndrome: Secondary | ICD-10-CM

## 2023-12-07 DIAGNOSIS — G4733 Obstructive sleep apnea (adult) (pediatric): Secondary | ICD-10-CM | POA: Diagnosis not present

## 2023-12-07 DIAGNOSIS — I10 Essential (primary) hypertension: Secondary | ICD-10-CM | POA: Diagnosis not present

## 2023-12-07 NOTE — Patient Instructions (Addendum)
 Patient Instructions  Continue to use CPAP every night, minimum of 7-8 hours a night.  Change equipment every 30 days or as directed by DME.  Wash your tubing with warm soap and water weekly, hang to dry. Wash humidifier portion weekly. Use distilled water and change daily   Be aware of reduced alertness and do not drive or operate heavy machinery if experiencing this or drowsiness.  Exercise encouraged, as tolerated. Encouraged proper weight management.  Important to get eight or more hours of sleep  Limiting the use of the computer and television before bedtime.  Decrease naps during the day, so night time sleep will become enhanced.  Limit caffeine, and sleep deprivation.  HTN, stroke, uncontrolled diabetes and heart failure are potential risk factors.  Risk of untreated sleep apnea including cardiac arrhthymias, stroke, DM, pulm HTN.       Patient Instructions  Continue to use CPAP every night, minimum of 4-6 hours a night.  Change equipment every 30 days or as directed by DME.  Wash your tubing with warm soap and water daily, hang to dry. Wash humidifier portion weekly. Use bottled, distilled water and change daily   Be aware of reduced alertness and do not drive or operate heavy machinery if experiencing this or drowsiness.  Exercise encouraged, as tolerated. Encouraged proper weight management.  Important to get eight or more hours of sleep  Limiting the use of the computer and television before bedtime.  Decrease naps during the day, so night time sleep will become enhanced.  Limit caffeine, and sleep deprivation.  HTN, stroke, uncontrolled diabetes and heart failure are potential risk factors.  Risk of untreated sleep apnea including cardiac arrhthymias, stroke, DM, pulm HTN.

## 2023-12-07 NOTE — Progress Notes (Addendum)
 Name:Nicholas Carr MRN: [id number] DOB: [birth year]   CHIEF COMPLAINT:  CPAP F/U   HISTORY OF PRESENT ILLNESS: Nicholas Carr is a 63 y.o. w/ a h/o OSA, prostate CA, HTN, DMII, hyperlipidemia and obesity who presents for CPAP follow up visit. Reports using CPAP therapy every night, which is confirmed by compliance data. He is currently using the Airfit F20 FFM, which is comfortable. Reports feeling more refreshed upon awakening with CPAP therapy. Denies any issues at this time. Reports nocturnal awakenings due to nocturia, however does not have difficulty falling back to sleep. Denies drowsy driving.    EPWORTH SLEEP SCORE    11/14/2017    9:00 AM  Results of the Epworth flowsheet  Sitting and reading   Watching TV   Sitting, inactive in a public place (e.g. a theatre or a meeting)   As a passenger in a car for an hour without a break   Lying down to rest in the afternoon when circumstances permit   Sitting and talking to someone   Sitting quietly after a lunch without alcohol   In a car, while stopped for a few minutes in traffic   Total score      Information is confidential and restricted. Go to Review Flowsheets to unlock data.    PAST MEDICAL HISTORY :   has a past medical history of Acne, Allergy, Arthritis, Blood transfusion without reported diagnosis, Cancer (HCC), Chronic kidney disease, COVID-19, Depression, Diabetes mellitus without complication (HCC), Elevated PSA, GERD (gastroesophageal reflux disease), History of kidney stones, Hyperlipidemia, Hypertension, OSA on CPAP, OSA on CPAP, Sleep apnea, Trench foot, Vitamin D  deficiency, and Vitamin D  deficiency.  has a past surgical history that includes exploratoy abdominal surgery (1985); Inguinal hernia repair (age 52); prostate biopsy ; Robot assisted laparoscopic radical prostatectomy (N/A, 02/26/2018); Lymphadenectomy (Bilateral, 02/26/2018); Colonoscopy; and Polypectomy. Prior to Admission medications   Medication Sig  Start Date End Date Taking? Authorizing Provider  Accu-Chek FastClix Lancets MISC CHECK BLOOD SUGAR TWICE A DAY 12/27/21  Yes McLean-Scocuzza, Randine SAILOR, MD  carvedilol  (COREG ) 12.5 MG tablet Take 1 tablet (12.5 mg total) by mouth 2 (two) times daily. 07/27/23 07/26/24 Yes Marylynn Verneita LITTIE, MD  fenofibrate  (TRICOR ) 145 MG tablet Take 1 tablet (145 mg total) by mouth daily. 11/13/23 11/12/24 Yes Marylynn Verneita LITTIE, MD  glucose blood (FREESTYLE LITE) test strip Use as instructed 03/20/23  Yes Marylynn Verneita LITTIE, MD  irbesartan  (AVAPRO ) 300 MG tablet Take 1 tablet (300 mg total) by mouth daily. 07/27/23 07/26/24 Yes Marylynn Verneita LITTIE, MD  lovastatin  (MEVACOR ) 40 MG tablet Take 1 tablet (40 mg total) by mouth at bedtime. 07/27/23 07/26/24 Yes Marylynn Verneita LITTIE, MD  metFORMIN  (GLUCOPHAGE ) 500 MG tablet Take 1 tablet (500 mg total) by mouth daily as needed. 11/02/22  Yes Marylynn Verneita LITTIE, MD  NIFEdipine  (PROCARDIA  XL/NIFEDICAL XL) 60 MG 24 hr tablet Take 2 tablets (120 mg total) by mouth daily. 11/16/23  Yes Darliss Rogue, MD  pantoprazole  (PROTONIX ) 40 MG tablet Take 1 tablet (40 mg total) by mouth daily. 30 minutes before food 05/01/23  Yes Marylynn Verneita LITTIE, MD  Semaglutide ,0.25 or 0.5MG /DOS, (OZEMPIC , 0.25 OR 0.5 MG/DOSE,) 2 MG/3ML SOPN Inject 0.5 mg into the skin once a week. 11/13/23  Yes Marylynn Verneita LITTIE, MD  sildenafil  (VIAGRA ) 100 MG tablet Take 1 tablet (100 mg total) by mouth as directed as needed 05/29/23 05/28/24 Yes   traMADol  (ULTRAM ) 50 MG tablet Take 1-2 tablets (50-100 mg  total) by mouth every 12 (twelve) hours as needed. MAX DOSE 100 MG DAILY 08/01/23  Yes Marylynn Verneita CROME, MD  candesartan  (ATACAND ) 32 MG tablet Take 1 tablet (32 mg total) by mouth daily. 02/07/20 05/01/20  McLean-Scocuzza, Randine SAILOR, MD   Allergies  Allergen Reactions   Boniva  [Ibandronate ] Other (See Comments)    achy   Lorazepam  Other (See Comments)    Deep sleep   Nortriptyline  Other (See Comments)    A very deep sleep   Wellbutrin  [Bupropion ] Other  (See Comments)    nightmares    FAMILY HISTORY:  family history includes Asthma in an other family member; Cancer in an other family member; Diabetes in his brother, maternal grandmother, and another family member; Heart disease in his brother and another family member; Hyperlipidemia in an other family member; Hypertension in an other family member; Kidney disease in his brother; Mental illness in his father; Obesity in an other family member; Prostate cancer in his maternal uncle; Renal Disease in his brother; Sleep apnea in an other family member; Stroke in an other family member. SOCIAL HISTORY:  reports that he quit smoking about 26 years ago. His smoking use included cigarettes. He started smoking about 42 years ago. He has a 24 pack-year smoking history. He has never used smokeless tobacco. He reports current alcohol use. He reports that he does not use drugs.   Review of Systems:  Gen:  Denies  fever, sweats, chills weight loss  HEENT: Denies blurred vision, double vision, ear pain, eye pain, hearing loss, nose bleeds, sore throat Cardiac:  No dizziness, chest pain or heaviness, chest tightness,edema, No JVD Resp:   No cough, -sputum production, -shortness of breath,-wheezing, -hemoptysis,  Gi: Denies swallowing difficulty, stomach pain, nausea or vomiting, diarrhea, constipation, bowel incontinence Gu:  Denies bladder incontinence, burning urine Ext:   Denies Joint pain, stiffness or swelling Skin: Denies  skin rash, easy bruising or bleeding or hives Endoc:  Denies polyuria, polydipsia , polyphagia or weight change Psych:   Denies depression, insomnia or hallucinations  Other:  All other systems negative  VITAL SIGNS: BP (!) 130/90 (BP Location: Right Arm, Patient Position: Sitting, Cuff Size: Large)   Pulse 85   Ht 6' 3 (1.905 m)   Wt 285 lb (129.3 kg)   SpO2 99% Comment: RA  BMI 35.62 kg/m    Physical Examination:   General Appearance: No distress  EYES PERRLA, EOM  intact.   NECK Supple, No JVD Pulmonary: normal breath sounds, No wheezing.  CardiovascularNormal S1,S2.  No m/r/g.   Abdomen: Benign, Soft, non-tender. Skin:   warm, no rashes, no ecchymosis  Extremities: normal, no cyanosis, clubbing. Neuro:without focal findings,  speech normal  PSYCHIATRIC: Mood, affect within normal limits.   ASSESSMENT AND PLAN  OSA Patient is using and benefiting from CPAP therapy. Discussed the consequences of untreated sleep apnea. Advised not to drive drowsy for safety of patient and others. Will follow up in 1 year.    HTN BP mildly elevated, advised patient to follow up with PCP for further management.   Insufficient sleep syndrome Counseled patient on increasing total sleep time to 7-8 hours per night.    Patient  satisfied with Plan of action and management. All questions answered  I spent a total of 35 minutes reviewing chart data, face-to-face evaluation with the patient, counseling and coordination of care as detailed above.    Aveen Stansel, M.D.  Sleep Medicine [facility]

## 2023-12-23 ENCOUNTER — Other Ambulatory Visit: Payer: Self-pay | Admitting: Cardiology

## 2023-12-23 ENCOUNTER — Other Ambulatory Visit: Payer: Self-pay

## 2023-12-23 MED FILL — Glucose Blood Test Strip: 90 days supply | Qty: 100 | Fill #3 | Status: AC

## 2023-12-23 MED FILL — Tramadol HCl Tab 50 MG: ORAL | 15 days supply | Qty: 60 | Fill #2 | Status: AC

## 2023-12-25 ENCOUNTER — Other Ambulatory Visit: Payer: Self-pay

## 2023-12-25 MED ORDER — NIFEDIPINE ER OSMOTIC RELEASE 60 MG PO TB24
120.0000 mg | ORAL_TABLET | Freq: Every day | ORAL | 0 refills | Status: DC
  Filled 2023-12-25: qty 30, 15d supply, fill #0

## 2024-01-06 ENCOUNTER — Other Ambulatory Visit: Payer: Self-pay | Admitting: Cardiology

## 2024-01-07 ENCOUNTER — Other Ambulatory Visit: Payer: Self-pay

## 2024-01-08 ENCOUNTER — Other Ambulatory Visit: Payer: Self-pay

## 2024-01-09 ENCOUNTER — Other Ambulatory Visit: Payer: Self-pay | Admitting: Cardiology

## 2024-01-09 ENCOUNTER — Other Ambulatory Visit: Payer: Self-pay

## 2024-01-13 ENCOUNTER — Other Ambulatory Visit: Payer: Self-pay

## 2024-01-13 ENCOUNTER — Other Ambulatory Visit: Payer: Self-pay | Admitting: Cardiology

## 2024-01-15 ENCOUNTER — Other Ambulatory Visit: Payer: Self-pay | Admitting: Internal Medicine

## 2024-01-15 ENCOUNTER — Other Ambulatory Visit: Payer: Self-pay

## 2024-01-15 NOTE — Telephone Encounter (Signed)
 Filled 3 weeks ago by cardiology

## 2024-01-16 ENCOUNTER — Other Ambulatory Visit: Payer: Self-pay

## 2024-01-16 MED ORDER — NIFEDIPINE ER OSMOTIC RELEASE 60 MG PO TB24
120.0000 mg | ORAL_TABLET | Freq: Every day | ORAL | 0 refills | Status: DC
Start: 2024-01-16 — End: 2024-01-31
  Filled 2024-01-16: qty 180, 90d supply, fill #0

## 2024-01-17 DIAGNOSIS — C61 Malignant neoplasm of prostate: Secondary | ICD-10-CM | POA: Diagnosis not present

## 2024-01-21 ENCOUNTER — Other Ambulatory Visit: Payer: Self-pay | Admitting: Internal Medicine

## 2024-01-21 DIAGNOSIS — E1159 Type 2 diabetes mellitus with other circulatory complications: Secondary | ICD-10-CM

## 2024-01-22 ENCOUNTER — Other Ambulatory Visit: Payer: Self-pay

## 2024-01-22 MED ORDER — CARVEDILOL 12.5 MG PO TABS
12.5000 mg | ORAL_TABLET | Freq: Two times a day (BID) | ORAL | 3 refills | Status: AC
  Filled 2024-01-22: qty 180, 90d supply, fill #0
  Filled 2024-04-27: qty 180, 90d supply, fill #1

## 2024-01-24 DIAGNOSIS — R399 Unspecified symptoms and signs involving the genitourinary system: Secondary | ICD-10-CM | POA: Diagnosis not present

## 2024-01-24 DIAGNOSIS — N5201 Erectile dysfunction due to arterial insufficiency: Secondary | ICD-10-CM | POA: Diagnosis not present

## 2024-01-24 DIAGNOSIS — C61 Malignant neoplasm of prostate: Secondary | ICD-10-CM | POA: Diagnosis not present

## 2024-01-31 ENCOUNTER — Encounter: Payer: Self-pay | Admitting: Internal Medicine

## 2024-01-31 ENCOUNTER — Ambulatory Visit: Admitting: Internal Medicine

## 2024-01-31 ENCOUNTER — Other Ambulatory Visit (HOSPITAL_COMMUNITY): Payer: Self-pay | Admitting: Urology

## 2024-01-31 ENCOUNTER — Other Ambulatory Visit: Payer: Self-pay

## 2024-01-31 VITALS — BP 132/84 | HR 77 | Ht 75.0 in | Wt 285.2 lb

## 2024-01-31 DIAGNOSIS — E782 Mixed hyperlipidemia: Secondary | ICD-10-CM

## 2024-01-31 DIAGNOSIS — Z23 Encounter for immunization: Secondary | ICD-10-CM | POA: Diagnosis not present

## 2024-01-31 DIAGNOSIS — N1831 Chronic kidney disease, stage 3a: Secondary | ICD-10-CM

## 2024-01-31 DIAGNOSIS — Z7985 Long-term (current) use of injectable non-insulin antidiabetic drugs: Secondary | ICD-10-CM

## 2024-01-31 DIAGNOSIS — C61 Malignant neoplasm of prostate: Secondary | ICD-10-CM | POA: Diagnosis not present

## 2024-01-31 DIAGNOSIS — E119 Type 2 diabetes mellitus without complications: Secondary | ICD-10-CM

## 2024-01-31 DIAGNOSIS — E1121 Type 2 diabetes mellitus with diabetic nephropathy: Secondary | ICD-10-CM

## 2024-01-31 LAB — LIPID PANEL
Cholesterol: 116 mg/dL (ref 0–200)
HDL: 43.2 mg/dL
LDL Cholesterol: 55 mg/dL (ref 0–99)
NonHDL: 72.44
Total CHOL/HDL Ratio: 3
Triglycerides: 88 mg/dL (ref 0.0–149.0)
VLDL: 17.6 mg/dL (ref 0.0–40.0)

## 2024-01-31 LAB — COMPREHENSIVE METABOLIC PANEL WITH GFR
ALT: 20 U/L (ref 0–53)
AST: 18 U/L (ref 0–37)
Albumin: 4.1 g/dL (ref 3.5–5.2)
Alkaline Phosphatase: 64 U/L (ref 39–117)
BUN: 22 mg/dL (ref 6–23)
CO2: 27 meq/L (ref 19–32)
Calcium: 9.8 mg/dL (ref 8.4–10.5)
Chloride: 105 meq/L (ref 96–112)
Creatinine, Ser: 1.37 mg/dL (ref 0.40–1.50)
GFR: 55.81 mL/min — ABNORMAL LOW
Glucose, Bld: 110 mg/dL — ABNORMAL HIGH (ref 70–99)
Potassium: 4 meq/L (ref 3.5–5.1)
Sodium: 140 meq/L (ref 135–145)
Total Bilirubin: 0.6 mg/dL (ref 0.2–1.2)
Total Protein: 7.1 g/dL (ref 6.0–8.3)

## 2024-01-31 LAB — CBC WITH DIFFERENTIAL/PLATELET
Basophils Absolute: 0 K/uL (ref 0.0–0.1)
Basophils Relative: 0.6 % (ref 0.0–3.0)
Eosinophils Absolute: 0.1 K/uL (ref 0.0–0.7)
Eosinophils Relative: 1.8 % (ref 0.0–5.0)
HCT: 40.2 % (ref 39.0–52.0)
Hemoglobin: 13 g/dL (ref 13.0–17.0)
Lymphocytes Relative: 28.2 % (ref 12.0–46.0)
Lymphs Abs: 1.6 K/uL (ref 0.7–4.0)
MCHC: 32.4 g/dL (ref 30.0–36.0)
MCV: 74.2 fl — ABNORMAL LOW (ref 78.0–100.0)
Monocytes Absolute: 0.4 K/uL (ref 0.1–1.0)
Monocytes Relative: 7.5 % (ref 3.0–12.0)
Neutro Abs: 3.6 K/uL (ref 1.4–7.7)
Neutrophils Relative %: 61.9 % (ref 43.0–77.0)
Platelets: 262 K/uL (ref 150.0–400.0)
RBC: 5.42 Mil/uL (ref 4.22–5.81)
RDW: 15.2 % (ref 11.5–15.5)
WBC: 5.7 K/uL (ref 4.0–10.5)

## 2024-01-31 LAB — MICROALBUMIN / CREATININE URINE RATIO
Creatinine,U: 90.8 mg/dL
Microalb Creat Ratio: 16.9 mg/g (ref 0.0–30.0)
Microalb, Ur: 1.5 mg/dL (ref 0.0–1.9)

## 2024-01-31 LAB — HEMOGLOBIN A1C: Hgb A1c MFr Bld: 6 % (ref 4.6–6.5)

## 2024-01-31 LAB — LDL CHOLESTEROL, DIRECT: Direct LDL: 55 mg/dL

## 2024-01-31 MED ORDER — NIFEDIPINE ER OSMOTIC RELEASE 60 MG PO TB24
120.0000 mg | ORAL_TABLET | Freq: Every day | ORAL | 0 refills | Status: AC
  Filled 2024-01-31 – 2024-03-31 (×2): qty 180, 90d supply, fill #0

## 2024-01-31 MED ORDER — FUROSEMIDE 20 MG PO TABS
20.0000 mg | ORAL_TABLET | Freq: Every day | ORAL | 3 refills | Status: AC | PRN
  Filled 2024-01-31: qty 30, 30d supply, fill #0

## 2024-01-31 MED ORDER — SEMAGLUTIDE (1 MG/DOSE) 4 MG/3ML ~~LOC~~ SOPN
1.0000 mg | PEN_INJECTOR | SUBCUTANEOUS | 2 refills | Status: AC
  Filled 2024-01-31 – 2024-02-29 (×3): qty 3, 28d supply, fill #0
  Filled 2024-03-23: qty 3, 28d supply, fill #1
  Filled 2024-04-27 – 2024-05-01 (×3): qty 3, 28d supply, fill #2

## 2024-01-31 NOTE — Progress Notes (Unsigned)
 Subjective:  Patient ID: Nicholas Carr, male    DOB: Apr 01, 1960  Age: 63 y.o. MRN: [id number]  CC: There were no encounter diagnoses.   HPI Nicholas Carr presents for  Chief Complaint  Patient presents with   Medical Management of Chronic Issues    6  month follow up   1) Type 2 DM:  He feels generally well,  But is not  exercising regularly  and not sleeping well due to receiving some distressing news from his urologist. He is tryng to lose weight. Checking  blood sugars less than once daily at variable times, usually only if she feels she may be having a hypoglycemic event. .  BS have been under 130 fasting and < 150 post prandially.  Denies any recent hypoglyemic events.  Taking  Ozempic  0.5 mg since August and his weight has plateaued.   Following a carbohydrate modified diet 6 days per week. Denies numbness, burning and tingling of extremities. Appetite is good.  Mild constipation .    2) HTN:  Patient is taking his medications ( nifedipine , carvedilol  and irbesartan )  as prescribed and notes no adverse effects.  Home BP readings have been done about once per week and are  generally < 130/80 .  he is avoiding added salt in her diet and walking 1-2 TIMES PER  week for exercise  .   3)   Outpatient Medications Prior to Visit  Medication Sig Dispense Refill   Accu-Chek FastClix Lancets MISC CHECK BLOOD SUGAR TWICE A DAY 204 each 1   carvedilol  (COREG ) 12.5 MG tablet Take 1 tablet (12.5 mg total) by mouth 2 (two) times daily. 60 tablet 3   fenofibrate  (TRICOR ) 145 MG tablet Take 1 tablet (145 mg total) by mouth daily. 90 tablet 3   glucose blood (FREESTYLE LITE) test strip Use as instructed 100 each 12   irbesartan  (AVAPRO ) 300 MG tablet Take 1 tablet (300 mg total) by mouth daily. 30 tablet 11   lovastatin  (MEVACOR ) 40 MG tablet Take 1 tablet (40 mg total) by mouth at bedtime. 90 tablet 3   metFORMIN  (GLUCOPHAGE ) 500 MG tablet Take 1 tablet (500 mg total) by mouth daily as needed. 90  tablet 3   NIFEdipine  (PROCARDIA  XL/NIFEDICAL XL) 60 MG 24 hr tablet Take 2 tablets (120 mg total) by mouth daily. Please contact our office to schedule an overdue office visit for future refills. [phone]. Thank you. Final attempt 180 tablet 0   pantoprazole  (PROTONIX ) 40 MG tablet Take 1 tablet (40 mg total) by mouth daily. 30 minutes before food 90 tablet 3   Semaglutide ,0.25 or 0.5MG /DOS, (OZEMPIC , 0.25 OR 0.5 MG/DOSE,) 2 MG/3ML SOPN Inject 0.5 mg into the skin once a week. 3 mL 2   sildenafil  (VIAGRA ) 100 MG tablet Take 1 tablet (100 mg total) by mouth as directed as needed 10 tablet 11   traMADol  (ULTRAM ) 50 MG tablet Take 1-2 tablets (50-100 mg total) by mouth every 12 (twelve) hours as needed. MAX DOSE 100 MG DAILY 60 tablet 5   candesartan  (ATACAND ) 32 MG tablet Take 1 tablet (32 mg total) by mouth daily. 90 tablet 3   Facility-Administered Medications Prior to Visit  Medication Dose Route Frequency Provider Last Rate Last Admin   0.9 %  sodium chloride  infusion  500 mL Intravenous Once Mansouraty, Gabriel Jr., MD        Review of Systems;  Patient denies headache, fevers, malaise, unintentional weight loss, skin rash, eye pain, sinus  congestion and sinus pain, sore throat, dysphagia,  hemoptysis , cough, dyspnea, wheezing, chest pain, palpitations, orthopnea, edema, abdominal pain, nausea, melena, diarrhea, constipation, flank pain, dysuria, hematuria, urinary  Frequency, nocturia, numbness, tingling, seizures,  Focal weakness, Loss of consciousness,  Tremor, insomnia, depression, anxiety, and suicidal ideation.      Objective:  BP 132/84   Pulse 77   Ht 6' 3 (1.905 m)   Wt 285 lb 3.2 oz (129.4 kg)   SpO2 96%   BMI 35.65 kg/m   BP Readings from Last 3 Encounters:  01/31/24 132/84  12/07/23 (!) 130/90  07/27/23 102/65    Wt Readings from Last 3 Encounters:  01/31/24 285 lb 3.2 oz (129.4 kg)  12/07/23 285 lb (129.3 kg)  07/27/23 291 lb (132 kg)    Physical  Exam  Lab Results  Component Value Date   HGBA1C 6.1 07/27/2023   HGBA1C 6.9 (H) 12/30/2022   HGBA1C 6.6 (H) 03/14/2022    Lab Results  Component Value Date   CREATININE 1.35 07/27/2023   CREATININE 1.12 12/30/2022   CREATININE 1.44 03/14/2022    Lab Results  Component Value Date   WBC 5.0 07/27/2023   HGB 13.1 07/27/2023   HCT 40.5 07/27/2023   PLT 254.0 07/27/2023   GLUCOSE 114 (H) 07/27/2023   CHOL 118 07/27/2023   TRIG 68.0 07/27/2023   HDL 41.10 07/27/2023   LDLDIRECT 59.0 07/27/2023   LDLCALC 63 07/27/2023   ALT 20 07/27/2023   AST 18 07/27/2023   NA 139 07/27/2023   K 3.9 07/27/2023   CL 106 07/27/2023   CREATININE 1.35 07/27/2023   BUN 19 07/27/2023   CO2 26 07/27/2023   TSH 3.26 12/30/2022   PSA 6.55 (H) 09/11/2017   HGBA1C 6.1 07/27/2023   MICROALBUR 6.1 10/08/2021    CT HEAD WO CONTRAST Result Date: 12/14/2018 CLINICAL DATA:  Loss of consciousness, lightheaded EXAM: CT HEAD WITHOUT CONTRAST; CT CERVICAL SPINE WITHOUT CONTRAST TECHNIQUE: Contiguous axial images were obtained from the base of the skull through the vertex without intravenous contrast. COMPARISON:  None. FINDINGS: Brain: No evidence of acute territorial infarction, hemorrhage, hydrocephalus,extra-axial collection or mass lesion/mass effect. Normal gray-white differentiation. Ventricles are normal in size and contour. Vascular: No hyperdense vessel or unexpected calcification. Skull: The skull is intact. No fracture or focal lesion identified. Sinuses/Orbits: There is a right maxillary mucous retention cyst. The remainder of the visualized paranasal sinuses are well aerated. The orbits appear intact. Other: None Cervical spine: Alignment: Physiologic Skull base and vertebrae: Visualized skull base is intact. No atlanto-occipital dissociation. The vertebral body heights are well maintained. No fracture or pathologic osseous lesion seen. Soft tissues and spinal canal: The visualized paraspinal soft  tissues are unremarkable. No prevertebral soft tissue swelling is seen. The spinal canal is grossly unremarkable, no large epidural collection or significant canal narrowing. Disc levels: Calcifications along the anterior longitudinal ligament with mild disc osteophyte complex and uncovertebral osteophytes seen at C5-C6. Upper chest: The lung apices are clear. Thoracic inlet is within normal limits. Other: None IMPRESSION: No acute intracranial pathology. No acute fracture or malalignment of the spine. Electronically Signed   By: Merlyn Rajas M.D.   On: 12/14/2018 18:50   CT CERVICAL SPINE WO CONTRAST Result Date: 12/14/2018 CLINICAL DATA:  Loss of consciousness, lightheaded EXAM: CT HEAD WITHOUT CONTRAST; CT CERVICAL SPINE WITHOUT CONTRAST TECHNIQUE: Contiguous axial images were obtained from the base of the skull through the vertex without intravenous contrast. COMPARISON:  None. FINDINGS: Brain:  No evidence of acute territorial infarction, hemorrhage, hydrocephalus,extra-axial collection or mass lesion/mass effect. Normal gray-white differentiation. Ventricles are normal in size and contour. Vascular: No hyperdense vessel or unexpected calcification. Skull: The skull is intact. No fracture or focal lesion identified. Sinuses/Orbits: There is a right maxillary mucous retention cyst. The remainder of the visualized paranasal sinuses are well aerated. The orbits appear intact. Other: None Cervical spine: Alignment: Physiologic Skull base and vertebrae: Visualized skull base is intact. No atlanto-occipital dissociation. The vertebral body heights are well maintained. No fracture or pathologic osseous lesion seen. Soft tissues and spinal canal: The visualized paraspinal soft tissues are unremarkable. No prevertebral soft tissue swelling is seen. The spinal canal is grossly unremarkable, no large epidural collection or significant canal narrowing. Disc levels: Calcifications along the anterior longitudinal ligament  with mild disc osteophyte complex and uncovertebral osteophytes seen at C5-C6. Upper chest: The lung apices are clear. Thoracic inlet is within normal limits. Other: None IMPRESSION: No acute intracranial pathology. No acute fracture or malalignment of the spine. Electronically Signed   By: Merlyn Rajas M.D.   On: 12/14/2018 18:50    Assessment & Plan:  .There are no diagnoses linked to this encounter.   I spent 34 minutes on the day of this face to face encounter reviewing patient's  most recent visit with cardiology,  nephrology,  and neurology,  prior relevant surgical and non surgical procedures, recent  labs and imaging studies, counseling on weight management,  reviewing the assessment and plan with patient, and post visit ordering and reviewing of  diagnostics and therapeutics with patient  .   Follow-up: No follow-ups on file.   Nicholas LITTIE Kettering, MD

## 2024-01-31 NOTE — Assessment & Plan Note (Addendum)
 Proteinuria has resolved with ARB .  rechecking today  Lab Results  Component Value Date   LABMICR Comment 06/15/2018   LABMICR 13.4 06/15/2018   MICROALBUR 1.5 01/31/2024   MICROALBUR 6.1 10/08/2021

## 2024-01-31 NOTE — Assessment & Plan Note (Signed)
 HIs PSA has risen again and he is anticipating need for XRT per Dr Watt

## 2024-01-31 NOTE — Patient Instructions (Signed)
 I have increased the ozempic  dose to 1 mg with your next refill  If yo don't tolerate the 1 mg dose,  don't throw it away.. OZEMPIC  PENS CAN BE MANIPULATED INTO DELIVERING LOWER DOSES BY COUNTING CLICKS    THE 4 MG PEN  (TEAL COLOR) THAT DELIVERS THE 1 MG WEEKLY DOS CAN DELIVER THE 0.5 MG DOSE AND THE 0.25 MG DOSE :  19 CLICKS    0.25 MG DOSE 37 CLICKS    0.50 MG DOSE

## 2024-02-01 LAB — TSH: TSH: 2.01 u[IU]/mL (ref 0.35–5.50)

## 2024-02-01 NOTE — Assessment & Plan Note (Signed)
 Currently well-controlled on metformin   and Ozempic  started after last visit.  Patient is reminded to schedule an annual eye exam and foot exam is normal today. Patient has no microalbuminuria. Patient is tolerating statin therapy for CAD risk reduction and on ACE/ARB for renal protection and hypertension  .  He has resumed aspirin twice weekly given his increased risk for CAD.  Will increase ozempic  dose due to increase weight gain   Lab Results  Component Value Date   HGBA1C 6.0 01/31/2024   Lab Results  Component Value Date   LABMICR Comment 06/15/2018   LABMICR 13.4 06/15/2018   MICROALBUR 1.5 01/31/2024   MICROALBUR 6.1 10/08/2021        Lab Results  Component Value Date   CREATININE 1.37 01/31/2024    Currently well-controlled on metformin   and Ozempic  started after last visit.  Patient is reminded to schedule an annual eye exam and foot exam is normal today. Patient has no microalbuminuria. Patient is tolerating statin therapy for CAD risk reduction and on ACE/ARB for renal protection and hypertension  .  He has resumed aspirin twice weekly given his increased risk for CAD  Lab Results  Component Value Date   HGBA1C 6.1 07/27/2023   Lab Results  Component Value Date   LABMICR Comment 06/15/2018   LABMICR 13.4 06/15/2018   MICROALBUR 1.1 12/30/2022   MICROALBUR 6.1 10/08/2021        Lab Results  Component Value Date   CREATININE 1.35 07/27/2023

## 2024-02-03 ENCOUNTER — Other Ambulatory Visit: Payer: Self-pay

## 2024-02-03 MED FILL — Pantoprazole Sodium EC Tab 40 MG (Base Equiv): ORAL | 90 days supply | Qty: 90 | Fill #3 | Status: AC

## 2024-02-04 ENCOUNTER — Ambulatory Visit: Payer: Self-pay | Admitting: Internal Medicine

## 2024-02-05 ENCOUNTER — Other Ambulatory Visit: Payer: Self-pay

## 2024-02-08 ENCOUNTER — Other Ambulatory Visit: Payer: Self-pay | Admitting: Radiation Oncology

## 2024-02-08 ENCOUNTER — Inpatient Hospital Stay
Admission: RE | Admit: 2024-02-08 | Discharge: 2024-02-08 | Disposition: A | Discharge status: Home / Self Care | Payer: Self-pay | Source: Ambulatory Visit | Attending: Radiation Oncology | Admitting: Radiation Oncology

## 2024-02-08 DIAGNOSIS — C61 Malignant neoplasm of prostate: Secondary | ICD-10-CM

## 2024-02-09 ENCOUNTER — Encounter (HOSPITAL_COMMUNITY)
Admission: RE | Admit: 2024-02-09 | Discharge: 2024-02-09 | Disposition: A | Discharge status: Home / Self Care | Source: Ambulatory Visit | Attending: Urology | Admitting: Urology

## 2024-02-09 DIAGNOSIS — C61 Malignant neoplasm of prostate: Secondary | ICD-10-CM | POA: Insufficient documentation

## 2024-02-09 MED ORDER — FLOTUFOLASTAT F 18 GALLIUM 296-5846 MBQ/ML IV SOLN
8.4500 | Freq: Once | INTRAVENOUS | Status: AC
  Administered 2024-02-09: 8.45 via INTRAVENOUS

## 2024-02-12 ENCOUNTER — Encounter: Payer: Self-pay | Admitting: Urology

## 2024-02-12 NOTE — Progress Notes (Signed)
 GU Location of Tumor / Histology: Prostate Ca (biochemical recurrence)  Radical Prostatectomy  PSA is (0.398 on 01/24/2024)  PSA  0.4 on 01/17/2024  Nicholas Carr presented as referral from Dr. Gretel Ferrara [facility]) elevated PSA.   02/09/2024 Dr. Gretel Ferrara NM PET (PSMA) Skull to Mid Thigh CLINICAL DATA:  Prostate carcinoma with biochemical recurrence.   IMPRESSION: No evidence of locally recurrent prostate carcinoma or metastatic disease.  Past/Anticipated interventions by urology, if any: NA  Past/Anticipated interventions by medical oncology, if any: NA  Weight changes, if any: {:18581}  IPSS: SHIM:  Bowel/Bladder complaints, if any: {:18581}   Nausea/Vomiting, if any: {:18581}  Pain issues, if any:  {:18581}  SAFETY ISSUES: Prior radiation?  Pacemaker/ICD? {:18581} Possible current pregnancy? Male Is the patient on methotrexate? No  Current Complaints / other details:    30 minutes spent total, including time for meaningful use questions, reviewing medication, as well as spent in face-to-face time in nurse evaluation with the patient.

## 2024-02-13 ENCOUNTER — Ambulatory Visit
Admission: RE | Admit: 2024-02-13 | Discharge: 2024-02-13 | Disposition: A | Discharge status: Home / Self Care | Source: Ambulatory Visit | Attending: Radiation Oncology | Admitting: Radiation Oncology

## 2024-02-13 ENCOUNTER — Telehealth: Payer: Self-pay | Admitting: Radiation Oncology

## 2024-02-13 ENCOUNTER — Encounter: Payer: Self-pay | Admitting: Radiation Oncology

## 2024-02-13 DIAGNOSIS — Z9079 Acquired absence of other genital organ(s): Secondary | ICD-10-CM | POA: Insufficient documentation

## 2024-02-13 DIAGNOSIS — I10 Essential (primary) hypertension: Secondary | ICD-10-CM | POA: Insufficient documentation

## 2024-02-13 DIAGNOSIS — C61 Malignant neoplasm of prostate: Secondary | ICD-10-CM | POA: Diagnosis not present

## 2024-02-13 DIAGNOSIS — E785 Hyperlipidemia, unspecified: Secondary | ICD-10-CM | POA: Diagnosis not present

## 2024-02-13 DIAGNOSIS — I129 Hypertensive chronic kidney disease with stage 1 through stage 4 chronic kidney disease, or unspecified chronic kidney disease: Secondary | ICD-10-CM | POA: Insufficient documentation

## 2024-02-13 DIAGNOSIS — Z8042 Family history of malignant neoplasm of prostate: Secondary | ICD-10-CM | POA: Insufficient documentation

## 2024-02-13 DIAGNOSIS — M129 Arthropathy, unspecified: Secondary | ICD-10-CM | POA: Insufficient documentation

## 2024-02-13 DIAGNOSIS — N529 Male erectile dysfunction, unspecified: Secondary | ICD-10-CM | POA: Diagnosis not present

## 2024-02-13 DIAGNOSIS — Z7985 Long-term (current) use of injectable non-insulin antidiabetic drugs: Secondary | ICD-10-CM | POA: Insufficient documentation

## 2024-02-13 DIAGNOSIS — E119 Type 2 diabetes mellitus without complications: Secondary | ICD-10-CM | POA: Insufficient documentation

## 2024-02-13 DIAGNOSIS — Z8616 Personal history of COVID-19: Secondary | ICD-10-CM | POA: Diagnosis not present

## 2024-02-13 DIAGNOSIS — Z7984 Long term (current) use of oral hypoglycemic drugs: Secondary | ICD-10-CM | POA: Insufficient documentation

## 2024-02-13 DIAGNOSIS — Z79899 Other long term (current) drug therapy: Secondary | ICD-10-CM | POA: Diagnosis not present

## 2024-02-13 DIAGNOSIS — Z87442 Personal history of urinary calculi: Secondary | ICD-10-CM | POA: Insufficient documentation

## 2024-02-13 DIAGNOSIS — R9721 Rising PSA following treatment for malignant neoplasm of prostate: Secondary | ICD-10-CM | POA: Diagnosis not present

## 2024-02-13 DIAGNOSIS — Z87891 Personal history of nicotine dependence: Secondary | ICD-10-CM | POA: Insufficient documentation

## 2024-02-13 DIAGNOSIS — K219 Gastro-esophageal reflux disease without esophagitis: Secondary | ICD-10-CM | POA: Insufficient documentation

## 2024-02-13 HISTORY — DX: Malignant neoplasm of prostate: C61

## 2024-02-13 HISTORY — DX: Male erectile dysfunction, unspecified: N52.9

## 2024-02-13 NOTE — Telephone Encounter (Signed)
 11/18 Outgoing referral faxed to Dr. Redell Falconer office at [facility] [place].  Also spoke to [facility] dept for all recent PET/CT images to be pushed to [facility] at [facility].
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
## 2024-02-13 NOTE — Progress Notes (Signed)
 Introduced myself to the patient as the prostate nurse navigator. He is here to discuss his radiation treatment options. I provided patient with some written education and my direct contact information.  Patient knows to reach out with any questions or barriers that may arise.

## 2024-02-14 ENCOUNTER — Other Ambulatory Visit: Payer: Self-pay

## 2024-02-14 ENCOUNTER — Other Ambulatory Visit: Payer: Self-pay | Admitting: Internal Medicine

## 2024-02-14 MED ORDER — BUSPIRONE HCL 10 MG PO TABS
10.0000 mg | ORAL_TABLET | Freq: Two times a day (BID) | ORAL | 2 refills | Status: AC
  Filled 2024-02-14: qty 60, 30d supply, fill #0

## 2024-02-16 NOTE — Progress Notes (Signed)
 RN placed call to [facility] to follow up on recent referral.  Per [facility] patient has not been scheduled at this time due to change in insurance at the end of year.   RN spoke with patient, patient is currently working with his HR department to obtain new insurance through his work since he will no longer be on spouses insurance.  Patient will notify [facility] once he receives new insurance information.    RN will follow up to ensure continuation of care.

## 2024-02-20 NOTE — Progress Notes (Signed)
 Patient's wife called to update that patient is now with insurance and has consult visit with [facility].   No additional needs at this time.

## 2024-02-28 DIAGNOSIS — C61 Malignant neoplasm of prostate: Secondary | ICD-10-CM | POA: Diagnosis not present

## 2024-02-29 ENCOUNTER — Other Ambulatory Visit: Payer: Self-pay

## 2024-03-02 ENCOUNTER — Other Ambulatory Visit: Payer: Self-pay

## 2024-03-04 DIAGNOSIS — C61 Malignant neoplasm of prostate: Secondary | ICD-10-CM | POA: Diagnosis not present

## 2024-03-11 ENCOUNTER — Telehealth: Payer: Self-pay

## 2024-03-11 NOTE — Telephone Encounter (Signed)
 Copied from CRM #[id number]. Topic: General - Other >> Mar 11, 2024  8:39 AM Benton O wrote: Reason for CRM: patient wife is requesting a print out of his cpap report . Please reach out to patient wife concerning this information [phone]

## 2024-03-11 NOTE — Telephone Encounter (Signed)
 LMTCB. I need to know if the patient needs a 30 or 90 day compliance print out.    E2C2 please advise when patient calls back.

## 2024-03-11 NOTE — Telephone Encounter (Signed)
 I spoke with the patient's wife. I have mailed the compliance report to her.  Nothing further needed.

## 2024-03-12 ENCOUNTER — Telehealth: Payer: Self-pay

## 2024-03-12 NOTE — Telephone Encounter (Signed)
 Letter to allow pt to safely operate a motor vehicle has been printed, signed and given to pt.

## 2024-03-13 DIAGNOSIS — C61 Malignant neoplasm of prostate: Secondary | ICD-10-CM | POA: Diagnosis not present

## 2024-03-13 DIAGNOSIS — M47816 Spondylosis without myelopathy or radiculopathy, lumbar region: Secondary | ICD-10-CM | POA: Diagnosis not present

## 2024-03-23 ENCOUNTER — Other Ambulatory Visit: Payer: Self-pay

## 2024-03-25 ENCOUNTER — Other Ambulatory Visit: Payer: Self-pay

## 2024-03-29 ENCOUNTER — Other Ambulatory Visit: Payer: Self-pay

## 2024-03-31 ENCOUNTER — Other Ambulatory Visit: Payer: Self-pay

## 2024-04-04 ENCOUNTER — Other Ambulatory Visit: Payer: Self-pay

## 2024-04-07 ENCOUNTER — Other Ambulatory Visit: Payer: Self-pay

## 2024-04-22 ENCOUNTER — Other Ambulatory Visit: Payer: Self-pay

## 2024-04-27 ENCOUNTER — Other Ambulatory Visit: Payer: Self-pay | Admitting: Internal Medicine

## 2024-04-27 DIAGNOSIS — K219 Gastro-esophageal reflux disease without esophagitis: Secondary | ICD-10-CM

## 2024-04-28 ENCOUNTER — Other Ambulatory Visit: Payer: Self-pay | Admitting: Internal Medicine

## 2024-04-28 ENCOUNTER — Other Ambulatory Visit (HOSPITAL_COMMUNITY): Payer: Self-pay

## 2024-04-28 ENCOUNTER — Other Ambulatory Visit: Payer: Self-pay

## 2024-04-28 DIAGNOSIS — E1159 Type 2 diabetes mellitus with other circulatory complications: Secondary | ICD-10-CM

## 2024-04-29 ENCOUNTER — Other Ambulatory Visit: Payer: Self-pay

## 2024-04-30 ENCOUNTER — Other Ambulatory Visit (HOSPITAL_COMMUNITY): Payer: Self-pay

## 2024-04-30 ENCOUNTER — Other Ambulatory Visit: Payer: Self-pay

## 2024-04-30 MED ORDER — PANTOPRAZOLE SODIUM 40 MG PO TBEC
40.0000 mg | DELAYED_RELEASE_TABLET | Freq: Every day | ORAL | 3 refills | Status: AC
  Filled 2024-04-30: qty 90, 90d supply, fill #0

## 2024-04-30 MED ORDER — CARVEDILOL 12.5 MG PO TABS
12.5000 mg | ORAL_TABLET | Freq: Two times a day (BID) | ORAL | 3 refills | Status: AC
  Filled 2024-04-30 – 2024-05-02 (×2): qty 180, 90d supply, fill #0

## 2024-04-30 MED ORDER — FREESTYLE LITE TEST VI STRP
ORAL_STRIP | 12 refills | Status: AC
  Filled 2024-04-30: qty 100, 100d supply, fill #0

## 2024-05-01 ENCOUNTER — Other Ambulatory Visit (HOSPITAL_COMMUNITY): Payer: Self-pay

## 2024-05-01 ENCOUNTER — Telehealth: Payer: Self-pay

## 2024-05-01 ENCOUNTER — Other Ambulatory Visit (HOSPITAL_BASED_OUTPATIENT_CLINIC_OR_DEPARTMENT_OTHER): Payer: Self-pay

## 2024-05-01 ENCOUNTER — Other Ambulatory Visit: Payer: Self-pay

## 2024-05-01 NOTE — Telephone Encounter (Signed)
 Pharmacy Patient Advocate Encounter  Received notification from [employer] that Prior Authorization for  Ozempic  (1 MG/DOSE) 4MG /3ML pen-injectors  has been APPROVED from 05/01/24 to 05/01/25. Ran test claim, Copay is $0. This test claim was processed through [facility]- copay amounts may vary at other pharmacies due to pharmacy/plan contracts, or as the patient moves through the different stages of their insurance plan.   PA #/Case ID/Reference #: [id number]

## 2024-05-01 NOTE — Telephone Encounter (Signed)
 Pharmacy Patient Advocate Encounter   Received notification from Pt Calls Messages that prior authorization for Ozempic  (1 MG/DOSE) 4MG /3ML pen-injectors  is required/requested.   Insurance verification completed.   The patient is insured through [employer].   Per test claim: PA required; PA submitted to above mentioned insurance via Latent Key/confirmation #/EOC [id number] Status is pending

## 2024-05-02 ENCOUNTER — Other Ambulatory Visit: Payer: Self-pay

## 2024-08-02 ENCOUNTER — Ambulatory Visit: Admitting: Internal Medicine
